# Patient Record
Sex: Female | Born: 1940 | Race: White | Hispanic: No | Smoking: Never smoker
Health system: Southern US, Community
[De-identification: ages and names within clinical notes are randomized; demographics above are authoritative.]

## PROBLEM LIST (undated history)

## (undated) DIAGNOSIS — Z8489 Family history of other specified conditions: Secondary | ICD-10-CM

## (undated) DIAGNOSIS — M1990 Unspecified osteoarthritis, unspecified site: Secondary | ICD-10-CM

## (undated) DIAGNOSIS — Z8701 Personal history of pneumonia (recurrent): Secondary | ICD-10-CM

## (undated) DIAGNOSIS — M1711 Unilateral primary osteoarthritis, right knee: Secondary | ICD-10-CM

## (undated) DIAGNOSIS — I10 Essential (primary) hypertension: Secondary | ICD-10-CM

## (undated) DIAGNOSIS — K219 Gastro-esophageal reflux disease without esophagitis: Secondary | ICD-10-CM

## (undated) DIAGNOSIS — I6529 Occlusion and stenosis of unspecified carotid artery: Secondary | ICD-10-CM

## (undated) DIAGNOSIS — I480 Paroxysmal atrial fibrillation: Secondary | ICD-10-CM

## (undated) DIAGNOSIS — J3089 Other allergic rhinitis: Secondary | ICD-10-CM

## (undated) DIAGNOSIS — J189 Pneumonia, unspecified organism: Secondary | ICD-10-CM

## (undated) DIAGNOSIS — J302 Other seasonal allergic rhinitis: Secondary | ICD-10-CM

## (undated) DIAGNOSIS — D649 Anemia, unspecified: Secondary | ICD-10-CM

## (undated) DIAGNOSIS — J45909 Unspecified asthma, uncomplicated: Secondary | ICD-10-CM

## (undated) DIAGNOSIS — R011 Cardiac murmur, unspecified: Secondary | ICD-10-CM

## (undated) HISTORY — DX: Unspecified osteoarthritis, unspecified site: M19.90

## (undated) HISTORY — DX: Essential (primary) hypertension: I10

## (undated) HISTORY — PX: TONSILLECTOMY: SUR1361

## (undated) HISTORY — DX: Other seasonal allergic rhinitis: J30.2

## (undated) HISTORY — PX: COLONOSCOPY: SHX174

## (undated) HISTORY — DX: Paroxysmal atrial fibrillation: I48.0

## (undated) HISTORY — PX: JOINT REPLACEMENT: SHX530

## (undated) HISTORY — PX: CATARACT EXTRACTION W/ INTRAOCULAR LENS  IMPLANT, BILATERAL: SHX1307

## (undated) HISTORY — PX: FRACTURE SURGERY: SHX138

## (undated) HISTORY — DX: Occlusion and stenosis of unspecified carotid artery: I65.29

## (undated) HISTORY — DX: Personal history of pneumonia (recurrent): Z87.01

## (undated) HISTORY — DX: Cardiac murmur, unspecified: R01.1

## (undated) HISTORY — PX: REPLACEMENT TOTAL KNEE: SUR1224

---

## 1983-02-15 HISTORY — PX: DILATION AND CURETTAGE OF UTERUS: SHX78

## 1983-02-15 HISTORY — PX: ABDOMINAL HYSTERECTOMY: SHX81

## 2000-05-23 ENCOUNTER — Ambulatory Visit (HOSPITAL_COMMUNITY): Admission: RE | Admit: 2000-05-23 | Discharge: 2000-05-23 | Payer: Self-pay | Admitting: Ophthalmology

## 2000-12-06 ENCOUNTER — Encounter: Payer: Self-pay | Admitting: Internal Medicine

## 2000-12-06 ENCOUNTER — Ambulatory Visit (HOSPITAL_COMMUNITY): Admission: RE | Admit: 2000-12-06 | Discharge: 2000-12-06 | Payer: Self-pay | Admitting: Internal Medicine

## 2001-10-09 ENCOUNTER — Ambulatory Visit (HOSPITAL_COMMUNITY): Admission: RE | Admit: 2001-10-09 | Discharge: 2001-10-09 | Payer: Self-pay | Admitting: Ophthalmology

## 2002-05-14 ENCOUNTER — Ambulatory Visit (HOSPITAL_COMMUNITY): Admission: RE | Admit: 2002-05-14 | Discharge: 2002-05-14 | Payer: Self-pay | Admitting: Internal Medicine

## 2002-05-14 ENCOUNTER — Encounter: Payer: Self-pay | Admitting: Internal Medicine

## 2002-12-23 ENCOUNTER — Other Ambulatory Visit: Admission: RE | Admit: 2002-12-23 | Discharge: 2002-12-23 | Payer: Self-pay | Admitting: Dermatology

## 2003-07-07 ENCOUNTER — Ambulatory Visit (HOSPITAL_COMMUNITY): Admission: RE | Admit: 2003-07-07 | Discharge: 2003-07-07 | Payer: Self-pay | Admitting: Internal Medicine

## 2004-08-02 ENCOUNTER — Ambulatory Visit (HOSPITAL_COMMUNITY): Admission: RE | Admit: 2004-08-02 | Discharge: 2004-08-02 | Payer: Self-pay | Admitting: Family Medicine

## 2004-12-27 ENCOUNTER — Ambulatory Visit: Payer: Self-pay | Admitting: Internal Medicine

## 2004-12-27 ENCOUNTER — Ambulatory Visit (HOSPITAL_COMMUNITY): Admission: RE | Admit: 2004-12-27 | Discharge: 2004-12-27 | Payer: Self-pay | Admitting: Internal Medicine

## 2005-04-26 ENCOUNTER — Ambulatory Visit (HOSPITAL_COMMUNITY): Admission: RE | Admit: 2005-04-26 | Discharge: 2005-04-26 | Payer: Self-pay | Admitting: Family Medicine

## 2005-08-04 ENCOUNTER — Ambulatory Visit (HOSPITAL_COMMUNITY): Admission: RE | Admit: 2005-08-04 | Discharge: 2005-08-04 | Payer: Self-pay | Admitting: Family Medicine

## 2005-08-09 ENCOUNTER — Ambulatory Visit (HOSPITAL_COMMUNITY): Admission: RE | Admit: 2005-08-09 | Discharge: 2005-08-09 | Payer: Self-pay | Admitting: Ophthalmology

## 2005-11-17 ENCOUNTER — Ambulatory Visit (HOSPITAL_COMMUNITY): Admission: RE | Admit: 2005-11-17 | Discharge: 2005-11-17 | Payer: Self-pay | Admitting: Urology

## 2006-08-08 ENCOUNTER — Ambulatory Visit (HOSPITAL_COMMUNITY): Admission: RE | Admit: 2006-08-08 | Discharge: 2006-08-08 | Payer: Self-pay | Admitting: Family Medicine

## 2006-08-10 ENCOUNTER — Ambulatory Visit (HOSPITAL_COMMUNITY): Admission: RE | Admit: 2006-08-10 | Discharge: 2006-08-10 | Payer: Self-pay | Admitting: Family Medicine

## 2006-11-01 ENCOUNTER — Ambulatory Visit (HOSPITAL_COMMUNITY): Admission: RE | Admit: 2006-11-01 | Discharge: 2006-11-01 | Payer: Self-pay | Admitting: Family Medicine

## 2007-08-14 ENCOUNTER — Ambulatory Visit (HOSPITAL_COMMUNITY): Admission: RE | Admit: 2007-08-14 | Discharge: 2007-08-14 | Payer: Self-pay | Admitting: Family Medicine

## 2007-10-04 ENCOUNTER — Ambulatory Visit (HOSPITAL_COMMUNITY): Admission: RE | Admit: 2007-10-04 | Discharge: 2007-10-04 | Payer: Self-pay | Admitting: Family Medicine

## 2008-09-05 ENCOUNTER — Ambulatory Visit (HOSPITAL_COMMUNITY): Admission: RE | Admit: 2008-09-05 | Discharge: 2008-09-05 | Payer: Self-pay | Admitting: Family Medicine

## 2009-02-18 ENCOUNTER — Emergency Department (HOSPITAL_COMMUNITY): Admission: EM | Admit: 2009-02-18 | Discharge: 2009-02-18 | Payer: Self-pay | Admitting: Emergency Medicine

## 2009-10-09 ENCOUNTER — Ambulatory Visit (HOSPITAL_COMMUNITY): Admission: RE | Admit: 2009-10-09 | Discharge: 2009-10-09 | Payer: Self-pay | Admitting: Family Medicine

## 2010-03-08 ENCOUNTER — Encounter: Payer: Self-pay | Admitting: Family Medicine

## 2010-03-16 ENCOUNTER — Ambulatory Visit (HOSPITAL_COMMUNITY)
Admission: RE | Admit: 2010-03-16 | Discharge: 2010-03-16 | Payer: Self-pay | Source: Home / Self Care | Attending: Family Medicine | Admitting: Family Medicine

## 2010-03-16 LAB — CREATININE, SERUM
Creatinine, Ser: 0.94 mg/dL (ref 0.4–1.2)
GFR calc Af Amer: >60 mL/min (ref >60)
GFR calc non Af Amer: 59 mL/min — ABNORMAL LOW (ref >60)

## 2010-05-02 LAB — CBC
HCT: 33.3 % — ABNORMAL LOW (ref 36.0–46.0)
Hemoglobin: 11.4 g/dL — ABNORMAL LOW (ref 12.0–15.0)
MCHC: 34.3 g/dL (ref 30.0–36.0)
MCV: 82.4 fL (ref 78.0–100.0)
Platelets: 230 10*3/uL (ref 150–400)
RBC: 4.04 MIL/uL (ref 3.87–5.11)
RDW: 13.6 % (ref 11.5–15.5)
WBC: 5.6 K/uL (ref 4.0–10.5)

## 2010-05-02 LAB — BASIC METABOLIC PANEL WITH GFR
CO2: 27 meq/L (ref 19–32)
Chloride: 100 meq/L (ref 96–112)
Creatinine, Ser: 0.95 mg/dL (ref 0.4–1.2)
GFR calc Af Amer: >60 mL/min (ref >60)
Potassium: 3.9 meq/L (ref 3.5–5.1)

## 2010-05-02 LAB — DIFFERENTIAL
Basophils Absolute: 0 K/uL (ref 0.0–0.1)
Basophils Relative: 1 % (ref 0–1)
Eosinophils Absolute: 0.2 10*3/uL (ref 0.0–0.7)
Eosinophils Relative: 4 % (ref 0–5)
Lymphocytes Relative: 29 % (ref 12–46)
Lymphs Abs: 1.6 10*3/uL (ref 0.7–4.0)
Monocytes Absolute: 0.4 K/uL (ref 0.1–1.0)
Monocytes Relative: 7 % (ref 3–12)
Neutro Abs: 3.4 K/uL (ref 1.7–7.7)
Neutrophils Relative %: 60 % (ref 43–77)

## 2010-05-02 LAB — POCT CARDIAC MARKERS
CKMB, poc: 1.9 ng/mL (ref 1.0–8.0)
Myoglobin, poc: 192 ng/mL (ref 12–200)
Troponin i, poc: <0.05 ng/mL (ref 0.00–0.09)

## 2010-05-02 LAB — BASIC METABOLIC PANEL
BUN: 17 mg/dL (ref 6–23)
Calcium: 9.7 mg/dL (ref 8.4–10.5)
GFR calc non Af Amer: 58 mL/min — ABNORMAL LOW (ref >60)
Glucose, Bld: 119 mg/dL — ABNORMAL HIGH (ref 70–99)
Sodium: 135 mEq/L (ref 135–145)

## 2010-07-02 NOTE — Op Note (Signed)
Bailey Wilson, Bailey Wilson                ACCOUNT NO.:  [id number]   MEDICAL RECORD NO.:  [id number]          PATIENT TYPE:  AMB   LOCATION:  DAY                           FACILITY:  [facility]   PHYSICIAN:  Lionel December, M.D.    DATE OF BIRTH:  [birth year]   DATE OF PROCEDURE:  12/27/2004  DATE OF DISCHARGE:                                 OPERATIVE REPORT   PROCEDURE:  Colonoscopy.   INDICATIONS:  Bailey Wilson is a 70 year old Caucasian female who is here for  screening colonoscopy. Procedure risks were reviewed with the patient, and  informed consent was obtained.   MEDICATIONS:  Medicines used for conscious sedation, Demerol 50 mg IV,  Versed 5 mg IV.   FINDINGS:  Procedure performed in endoscopy suite. The patient's vital signs  and O2 saturations were monitored during the procedure and remained stable.  The patient was placed in left lateral position, and rectal examination  performed. No abnormality noted on external or digital exam. Olympus  videoscope was placed in rectum and advanced under vision into sigmoid colon  and beyond. Preparation was satisfactory. Scope was passed into cecum which  was identified by appendiceal orifice and ileocecal valve. Picture was taken  for the record. As the scope was withdrawn, colonic mucosa was carefully  examined and was normal throughout. Rectal mucosa was normal. Scope was  retroflexed to examine anorectal junction, and small hemorrhoids were noted  below the dentate line. Endoscope was straightened and withdrawn. The  patient tolerated the procedure well.   FINAL DIAGNOSIS:  Small external hemorrhoids. Otherwise normal colonoscopy.   RECOMMENDATIONS:  1.  She will resume her usual medications and diet.  2.  Yearly Hemoccults.  3.  She may consider next screening exam in 10 years from now.      Lionel December, M.D.  Electronically Signed     NR/MEDQ  D:  12/27/2004  T:  12/27/2004  Job:  [id number]   cc:   Patrica Duel, M.D.  Fax: [phone]

## 2010-09-24 ENCOUNTER — Other Ambulatory Visit (HOSPITAL_COMMUNITY): Payer: Self-pay | Admitting: Family Medicine

## 2010-09-24 DIAGNOSIS — Z139 Encounter for screening, unspecified: Secondary | ICD-10-CM

## 2010-10-12 ENCOUNTER — Ambulatory Visit (HOSPITAL_COMMUNITY)
Admission: RE | Admit: 2010-10-12 | Discharge: 2010-10-12 | Disposition: A | Discharge status: Home / Self Care | Payer: Medicare FFS | Source: Ambulatory Visit | Attending: Family Medicine | Admitting: Family Medicine

## 2010-10-12 DIAGNOSIS — Z139 Encounter for screening, unspecified: Secondary | ICD-10-CM

## 2010-10-12 DIAGNOSIS — Z1231 Encounter for screening mammogram for malignant neoplasm of breast: Secondary | ICD-10-CM | POA: Insufficient documentation

## 2011-01-04 ENCOUNTER — Ambulatory Visit (HOSPITAL_COMMUNITY)
Admission: RE | Admit: 2011-01-04 | Discharge: 2011-01-04 | Disposition: A | Discharge status: Home / Self Care | Payer: Medicare FFS | Source: Ambulatory Visit | Attending: Pulmonary Disease | Admitting: Pulmonary Disease

## 2011-01-04 DIAGNOSIS — R0989 Other specified symptoms and signs involving the circulatory and respiratory systems: Secondary | ICD-10-CM | POA: Insufficient documentation

## 2011-01-04 DIAGNOSIS — R0609 Other forms of dyspnea: Secondary | ICD-10-CM | POA: Insufficient documentation

## 2011-01-04 LAB — BLOOD GAS, ARTERIAL
Bicarbonate: 25.7 mEq/L — ABNORMAL HIGH (ref 20.0–24.0)
O2 Saturation: 96.8 %
pCO2 arterial: 38.8 mmHg (ref 35.0–45.0)
pH, Arterial: 7.436 — ABNORMAL HIGH (ref 7.350–7.400)
pO2, Arterial: 80.9 mmHg (ref 80.0–100.0)

## 2011-01-05 NOTE — Procedures (Signed)
Bailey Wilson, Bailey Wilson                ACCOUNT NO.:  [id number]  MEDICAL RECORD NO.:  [id number]  LOCATION:  RESP                          FACILITY:  [facility]  PHYSICIAN:  Idalia Allbritton L. Juanetta Gosling, M.D.DATE OF BIRTH:  [birth year]  DATE OF PROCEDURE: DATE OF DISCHARGE:  01/04/2011                           PULMONARY FUNCTION TEST   REASON FOR PULMONARY FUNCTION TESTING:  Asthma.  1. Spirometry shows a mild ventilatory defect but no definite airflow     obstruction. 2. Lung volumes are normal. 3. DLCO is mildly reduced. 4. Arterial blood gas is normal. 5. There is no definite demonstration of asthma on this pulmonary     function test.     Laressa Bolinger L. Juanetta Gosling, M.D.     ELH/MEDQ  D:  01/05/2011  T:  01/05/2011  Job:  [id number]

## 2011-01-11 ENCOUNTER — Encounter (HOSPITAL_COMMUNITY): Payer: Medicare FFS

## 2011-03-21 ENCOUNTER — Encounter (HOSPITAL_COMMUNITY): Payer: Self-pay | Admitting: Pharmacy Technician

## 2011-03-29 ENCOUNTER — Encounter (HOSPITAL_COMMUNITY)
Admission: RE | Disposition: A | Discharge status: Home / Self Care | Payer: Self-pay | Source: Ambulatory Visit | Attending: Ophthalmology

## 2011-03-29 ENCOUNTER — Ambulatory Visit (HOSPITAL_COMMUNITY)
Admission: RE | Admit: 2011-03-29 | Discharge: 2011-03-29 | Disposition: A | Discharge status: Home / Self Care | Payer: Medicare PPO | Source: Ambulatory Visit | Attending: Ophthalmology | Admitting: Ophthalmology

## 2011-03-29 DIAGNOSIS — H26499 Other secondary cataract, unspecified eye: Secondary | ICD-10-CM | POA: Insufficient documentation

## 2011-03-29 SURGERY — TREATMENT, USING YAG LASER
Anesthesia: LOCAL | Laterality: Right

## 2011-03-29 MED ORDER — CYCLOPENTOLATE-PHENYLEPHRINE 0.2-1 % OP SOLN
OPHTHALMIC | Status: AC
  Administered 2011-03-29: 1 [drp] via OPHTHALMIC
  Filled 2011-03-29: qty 2

## 2011-03-29 MED ORDER — TETRACAINE HCL 0.5 % OP SOLN: 1.0000 [drp] | Freq: Once | OPHTHALMIC | Status: DC

## 2011-03-29 MED ORDER — TROPICAMIDE 1 % OP SOLN: 1.0000 [drp] | OPHTHALMIC | Status: DC | PRN

## 2011-03-29 MED ORDER — CYCLOPENTOLATE-PHENYLEPHRINE 0.2-1 % OP SOLN
1.0000 [drp] | OPHTHALMIC | Status: AC
  Administered 2011-03-29 (×3): 1 [drp] via OPHTHALMIC

## 2011-03-29 MED ORDER — APRACLONIDINE HCL 1 % OP SOLN: 1.0000 [drp] | OPHTHALMIC | Status: DC | PRN

## 2011-03-29 MED ORDER — APRACLONIDINE HCL 1 % OP SOLN: 1.0000 [drp] | Freq: Once | OPHTHALMIC | Status: DC

## 2011-03-29 NOTE — Discharge Instructions (Signed)
OLIE SCAFFIDI  03/29/2011     Instructions    Activity: No Restrictions.   Diet: Resume Diet you were on at home.   Pain Medication: Tylenol if Needed.   CONTACT YOUR DOCTOR IF YOU HAVE PAIN, REDNESS IN YOUR EYE, OR DECREASED VISION.   Follow-up:today between 1 and 2 pm with Loraine Leriche T. [name], MD.   Dr. Lahoma Crocker: [phone]  Dr. Lita Mains: [phone]  Dr. Alto Denver: [phone]   If you find that you cannot contact your physician, but feel that your signs and   Symptoms warrant a physician's attention, call the Emergency Room at   [phone] [phone].   Othern/a.

## 2011-03-29 NOTE — H&P (Signed)
The patient was re examined and there is no change in the patients condition since the original H and P. 

## 2011-03-29 NOTE — Brief Op Note (Signed)
Bailey Wilson 03/29/2011  Atiya Yera T. [name], MD  Yag Laser Self Test Completedyes. Procedure: Posterior Capsulotomy, right eye.  Eye Protection Worn by Staff yes. Laser In Use Sign on Door yes.  Laser: Nd:YAG Spot Size: Fixed Burst Mode: III Power Setting: 3.9 mJ/burst  Number of shots:12 Total energy delivered: 45.5 mJ  Patency of the peripheral iridotomy was confirmed visually.  The patient tolerated the procedure without difficulty. No complications were encountered.    The patient was discharged home with the instructions to continue all her current glaucoma medications, if any.   Patient instructed to go to office at 0100 for intraocular pressure check.  Patient verbalizes understanding of discharge instructions yes.

## 2011-03-29 NOTE — Patient Instructions (Signed)
Bailey Wilson  03/29/2011     Instructions    Activity: No Restrictions.   Diet: Resume Diet you were on at home.   Pain Medication: Tylenol if Needed.   CONTACT YOUR DOCTOR IF YOU HAVE PAIN, REDNESS IN YOUR EYE, OR DECREASED VISION.   Follow-up:today with Loraine Leriche T. [name], MD.   Dr. Lahoma Crocker: [phone]  Dr. Lita Mains: [phone]  Dr. Alto Denver: [phone]   If you find that you cannot contact your physician, but feel that your signs and   Symptoms warrant a physician's attention, call the Emergency Room at   [phone].   Othern/a.

## 2011-09-21 ENCOUNTER — Other Ambulatory Visit (HOSPITAL_COMMUNITY): Payer: Self-pay | Admitting: Physician Assistant

## 2011-09-21 ENCOUNTER — Ambulatory Visit (HOSPITAL_COMMUNITY)
Admission: RE | Admit: 2011-09-21 | Discharge: 2011-09-21 | Disposition: A | Discharge status: Home / Self Care | Payer: Medicare PPO | Source: Ambulatory Visit | Attending: Physician Assistant | Admitting: Physician Assistant

## 2011-09-21 DIAGNOSIS — R079 Chest pain, unspecified: Secondary | ICD-10-CM | POA: Insufficient documentation

## 2011-09-23 ENCOUNTER — Other Ambulatory Visit (HOSPITAL_COMMUNITY): Payer: Self-pay | Admitting: Family Medicine

## 2011-09-23 DIAGNOSIS — IMO0001 Reserved for inherently not codable concepts without codable children: Secondary | ICD-10-CM

## 2011-10-14 ENCOUNTER — Ambulatory Visit (HOSPITAL_COMMUNITY)
Admission: RE | Admit: 2011-10-14 | Discharge: 2011-10-14 | Disposition: A | Discharge status: Home / Self Care | Payer: Medicare PPO | Source: Ambulatory Visit | Attending: Family Medicine | Admitting: Family Medicine

## 2011-10-14 DIAGNOSIS — IMO0001 Reserved for inherently not codable concepts without codable children: Secondary | ICD-10-CM

## 2011-10-14 DIAGNOSIS — Z1231 Encounter for screening mammogram for malignant neoplasm of breast: Secondary | ICD-10-CM | POA: Insufficient documentation

## 2011-10-24 ENCOUNTER — Other Ambulatory Visit: Payer: Self-pay | Admitting: Family Medicine

## 2011-10-24 DIAGNOSIS — R928 Other abnormal and inconclusive findings on diagnostic imaging of breast: Secondary | ICD-10-CM

## 2011-11-02 ENCOUNTER — Other Ambulatory Visit: Payer: Self-pay | Admitting: Family Medicine

## 2011-11-02 ENCOUNTER — Ambulatory Visit (HOSPITAL_COMMUNITY)
Admission: RE | Admit: 2011-11-02 | Discharge: 2011-11-02 | Disposition: A | Discharge status: Home / Self Care | Payer: Medicare PPO | Source: Ambulatory Visit | Attending: Family Medicine | Admitting: Family Medicine

## 2011-11-02 DIAGNOSIS — R928 Other abnormal and inconclusive findings on diagnostic imaging of breast: Secondary | ICD-10-CM

## 2012-05-24 ENCOUNTER — Other Ambulatory Visit (HOSPITAL_COMMUNITY): Payer: Self-pay | Admitting: Internal Medicine

## 2012-05-24 DIAGNOSIS — Z0000 Encounter for general adult medical examination without abnormal findings: Secondary | ICD-10-CM

## 2012-05-28 ENCOUNTER — Ambulatory Visit (HOSPITAL_COMMUNITY)
Admission: RE | Admit: 2012-05-28 | Discharge: 2012-05-28 | Disposition: A | Discharge status: Home / Self Care | Payer: Medicare PPO | Source: Ambulatory Visit | Attending: Internal Medicine | Admitting: Internal Medicine

## 2012-05-28 DIAGNOSIS — M899 Disorder of bone, unspecified: Secondary | ICD-10-CM | POA: Insufficient documentation

## 2012-05-28 DIAGNOSIS — Z0000 Encounter for general adult medical examination without abnormal findings: Secondary | ICD-10-CM

## 2012-06-26 ENCOUNTER — Emergency Department (HOSPITAL_COMMUNITY): Payer: Worker's Compensation

## 2012-06-26 ENCOUNTER — Encounter (HOSPITAL_COMMUNITY): Payer: Self-pay | Admitting: Emergency Medicine

## 2012-06-26 ENCOUNTER — Emergency Department (HOSPITAL_COMMUNITY)
Admission: EM | Admit: 2012-06-26 | Discharge: 2012-06-26 | Disposition: A | Discharge status: Home / Self Care | Payer: Worker's Compensation | Attending: Emergency Medicine | Admitting: Emergency Medicine

## 2012-06-26 DIAGNOSIS — Y92410 Unspecified street and highway as the place of occurrence of the external cause: Secondary | ICD-10-CM | POA: Insufficient documentation

## 2012-06-26 DIAGNOSIS — S93501A Unspecified sprain of right great toe, initial encounter: Secondary | ICD-10-CM

## 2012-06-26 DIAGNOSIS — Z79899 Other long term (current) drug therapy: Secondary | ICD-10-CM | POA: Insufficient documentation

## 2012-06-26 DIAGNOSIS — Y9389 Activity, other specified: Secondary | ICD-10-CM | POA: Insufficient documentation

## 2012-06-26 DIAGNOSIS — Z7982 Long term (current) use of aspirin: Secondary | ICD-10-CM | POA: Insufficient documentation

## 2012-06-26 DIAGNOSIS — S93609A Unspecified sprain of unspecified foot, initial encounter: Secondary | ICD-10-CM | POA: Insufficient documentation

## 2012-06-26 DIAGNOSIS — I10 Essential (primary) hypertension: Secondary | ICD-10-CM | POA: Insufficient documentation

## 2012-06-26 DIAGNOSIS — Z880 Allergy status to penicillin: Secondary | ICD-10-CM | POA: Insufficient documentation

## 2012-06-26 HISTORY — DX: Essential (primary) hypertension: I10

## 2012-06-26 MED ORDER — METHOCARBAMOL 500 MG PO TABS: 500.0000 mg | ORAL_TABLET | Freq: Three times a day (TID) | ORAL | Status: DC | PRN
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
## 2012-10-10 ENCOUNTER — Other Ambulatory Visit (HOSPITAL_COMMUNITY): Payer: Self-pay | Admitting: Family Medicine

## 2012-10-10 DIAGNOSIS — Z139 Encounter for screening, unspecified: Secondary | ICD-10-CM

## 2012-10-22 ENCOUNTER — Ambulatory Visit (HOSPITAL_COMMUNITY)
Admission: RE | Admit: 2012-10-22 | Discharge: 2012-10-22 | Disposition: A | Discharge status: Home / Self Care | Payer: Medicare PPO | Source: Ambulatory Visit | Attending: Family Medicine | Admitting: Family Medicine

## 2012-10-22 DIAGNOSIS — Z1231 Encounter for screening mammogram for malignant neoplasm of breast: Secondary | ICD-10-CM | POA: Insufficient documentation

## 2012-10-22 DIAGNOSIS — Z139 Encounter for screening, unspecified: Secondary | ICD-10-CM

## 2013-09-18 ENCOUNTER — Other Ambulatory Visit (HOSPITAL_COMMUNITY): Payer: Self-pay | Admitting: Family Medicine

## 2013-09-18 DIAGNOSIS — Z1231 Encounter for screening mammogram for malignant neoplasm of breast: Secondary | ICD-10-CM

## 2013-10-23 ENCOUNTER — Ambulatory Visit (HOSPITAL_COMMUNITY)
Admission: RE | Admit: 2013-10-23 | Discharge: 2013-10-23 | Disposition: A | Discharge status: Home / Self Care | Payer: Medicare HMO | Source: Ambulatory Visit | Attending: Family Medicine | Admitting: Family Medicine

## 2013-10-23 DIAGNOSIS — Z1231 Encounter for screening mammogram for malignant neoplasm of breast: Secondary | ICD-10-CM | POA: Diagnosis not present

## 2014-02-14 HISTORY — PX: NASAL FRACTURE SURGERY: SHX718

## 2014-02-18 DIAGNOSIS — L259 Unspecified contact dermatitis, unspecified cause: Secondary | ICD-10-CM | POA: Diagnosis not present

## 2014-02-18 DIAGNOSIS — L570 Actinic keratosis: Secondary | ICD-10-CM | POA: Diagnosis not present

## 2014-02-18 DIAGNOSIS — X32XXXD Exposure to sunlight, subsequent encounter: Secondary | ICD-10-CM | POA: Diagnosis not present

## 2014-03-27 DIAGNOSIS — M810 Age-related osteoporosis without current pathological fracture: Secondary | ICD-10-CM | POA: Diagnosis not present

## 2014-03-27 DIAGNOSIS — Z0000 Encounter for general adult medical examination without abnormal findings: Secondary | ICD-10-CM | POA: Diagnosis not present

## 2014-03-27 DIAGNOSIS — Z6827 Body mass index (BMI) 27.0-27.9, adult: Secondary | ICD-10-CM | POA: Diagnosis not present

## 2014-05-13 DIAGNOSIS — Z961 Presence of intraocular lens: Secondary | ICD-10-CM | POA: Diagnosis not present

## 2014-05-13 DIAGNOSIS — H3531 Nonexudative age-related macular degeneration: Secondary | ICD-10-CM | POA: Diagnosis not present

## 2014-06-13 DIAGNOSIS — E663 Overweight: Secondary | ICD-10-CM | POA: Diagnosis not present

## 2014-06-13 DIAGNOSIS — M1991 Primary osteoarthritis, unspecified site: Secondary | ICD-10-CM | POA: Diagnosis not present

## 2014-06-13 DIAGNOSIS — Z6827 Body mass index (BMI) 27.0-27.9, adult: Secondary | ICD-10-CM | POA: Diagnosis not present

## 2014-06-13 DIAGNOSIS — J45909 Unspecified asthma, uncomplicated: Secondary | ICD-10-CM | POA: Diagnosis not present

## 2014-06-13 DIAGNOSIS — M7989 Other specified soft tissue disorders: Secondary | ICD-10-CM | POA: Diagnosis not present

## 2014-06-13 DIAGNOSIS — M7121 Synovial cyst of popliteal space [Baker], right knee: Secondary | ICD-10-CM | POA: Diagnosis not present

## 2014-06-24 DIAGNOSIS — M170 Bilateral primary osteoarthritis of knee: Secondary | ICD-10-CM | POA: Diagnosis not present

## 2014-06-24 DIAGNOSIS — M545 Low back pain: Secondary | ICD-10-CM | POA: Diagnosis not present

## 2014-08-05 DIAGNOSIS — M170 Bilateral primary osteoarthritis of knee: Secondary | ICD-10-CM | POA: Diagnosis not present

## 2014-09-23 DIAGNOSIS — M170 Bilateral primary osteoarthritis of knee: Secondary | ICD-10-CM | POA: Diagnosis not present

## 2014-09-25 ENCOUNTER — Other Ambulatory Visit (HOSPITAL_COMMUNITY): Payer: Self-pay | Admitting: Family Medicine

## 2014-09-25 DIAGNOSIS — Z1231 Encounter for screening mammogram for malignant neoplasm of breast: Secondary | ICD-10-CM

## 2014-10-21 DIAGNOSIS — L570 Actinic keratosis: Secondary | ICD-10-CM | POA: Diagnosis not present

## 2014-10-21 DIAGNOSIS — X32XXXD Exposure to sunlight, subsequent encounter: Secondary | ICD-10-CM | POA: Diagnosis not present

## 2014-10-21 DIAGNOSIS — D225 Melanocytic nevi of trunk: Secondary | ICD-10-CM | POA: Diagnosis not present

## 2014-10-31 ENCOUNTER — Ambulatory Visit (HOSPITAL_COMMUNITY)
Admission: RE | Admit: 2014-10-31 | Discharge: 2014-10-31 | Disposition: A | Discharge status: Home / Self Care | Payer: Medicare Other | Source: Ambulatory Visit | Attending: Family Medicine | Admitting: Family Medicine

## 2014-10-31 DIAGNOSIS — Z1231 Encounter for screening mammogram for malignant neoplasm of breast: Secondary | ICD-10-CM | POA: Diagnosis not present

## 2014-12-18 ENCOUNTER — Encounter (INDEPENDENT_AMBULATORY_CARE_PROVIDER_SITE_OTHER): Payer: Self-pay | Admitting: *Deleted

## 2015-01-01 DIAGNOSIS — Z6828 Body mass index (BMI) 28.0-28.9, adult: Secondary | ICD-10-CM | POA: Diagnosis not present

## 2015-01-01 DIAGNOSIS — J301 Allergic rhinitis due to pollen: Secondary | ICD-10-CM | POA: Diagnosis not present

## 2015-01-01 DIAGNOSIS — Z1389 Encounter for screening for other disorder: Secondary | ICD-10-CM | POA: Diagnosis not present

## 2015-01-01 DIAGNOSIS — I10 Essential (primary) hypertension: Secondary | ICD-10-CM | POA: Diagnosis not present

## 2015-01-01 DIAGNOSIS — M1991 Primary osteoarthritis, unspecified site: Secondary | ICD-10-CM | POA: Diagnosis not present

## 2015-01-06 DIAGNOSIS — M170 Bilateral primary osteoarthritis of knee: Secondary | ICD-10-CM | POA: Diagnosis not present

## 2015-01-26 ENCOUNTER — Emergency Department (HOSPITAL_COMMUNITY)
Admission: EM | Admit: 2015-01-26 | Discharge: 2015-01-26 | Disposition: A | Discharge status: Home / Self Care | Payer: Medicare Other | Attending: Emergency Medicine | Admitting: Emergency Medicine

## 2015-01-26 ENCOUNTER — Emergency Department (HOSPITAL_COMMUNITY): Payer: Medicare Other

## 2015-01-26 ENCOUNTER — Encounter (HOSPITAL_COMMUNITY): Payer: Self-pay | Admitting: Emergency Medicine

## 2015-01-26 DIAGNOSIS — W010XXA Fall on same level from slipping, tripping and stumbling without subsequent striking against object, initial encounter: Secondary | ICD-10-CM | POA: Diagnosis not present

## 2015-01-26 DIAGNOSIS — Z880 Allergy status to penicillin: Secondary | ICD-10-CM | POA: Diagnosis not present

## 2015-01-26 DIAGNOSIS — Y9289 Other specified places as the place of occurrence of the external cause: Secondary | ICD-10-CM | POA: Insufficient documentation

## 2015-01-26 DIAGNOSIS — S0990XA Unspecified injury of head, initial encounter: Secondary | ICD-10-CM | POA: Insufficient documentation

## 2015-01-26 DIAGNOSIS — Z79899 Other long term (current) drug therapy: Secondary | ICD-10-CM | POA: Insufficient documentation

## 2015-01-26 DIAGNOSIS — S199XXA Unspecified injury of neck, initial encounter: Secondary | ICD-10-CM | POA: Diagnosis not present

## 2015-01-26 DIAGNOSIS — S022XXB Fracture of nasal bones, initial encounter for open fracture: Secondary | ICD-10-CM | POA: Diagnosis not present

## 2015-01-26 DIAGNOSIS — W19XXXA Unspecified fall, initial encounter: Secondary | ICD-10-CM

## 2015-01-26 DIAGNOSIS — S01511A Laceration without foreign body of lip, initial encounter: Secondary | ICD-10-CM | POA: Diagnosis not present

## 2015-01-26 DIAGNOSIS — S022XXA Fracture of nasal bones, initial encounter for closed fracture: Secondary | ICD-10-CM | POA: Diagnosis not present

## 2015-01-26 DIAGNOSIS — S0240CA Maxillary fracture, right side, initial encounter for closed fracture: Secondary | ICD-10-CM | POA: Insufficient documentation

## 2015-01-26 DIAGNOSIS — S60511A Abrasion of right hand, initial encounter: Secondary | ICD-10-CM | POA: Insufficient documentation

## 2015-01-26 DIAGNOSIS — S0240DA Maxillary fracture, left side, initial encounter for closed fracture: Secondary | ICD-10-CM | POA: Insufficient documentation

## 2015-01-26 DIAGNOSIS — S60512A Abrasion of left hand, initial encounter: Secondary | ICD-10-CM | POA: Diagnosis not present

## 2015-01-26 DIAGNOSIS — Y998 Other external cause status: Secondary | ICD-10-CM | POA: Insufficient documentation

## 2015-01-26 DIAGNOSIS — S0292XA Unspecified fracture of facial bones, initial encounter for closed fracture: Secondary | ICD-10-CM

## 2015-01-26 DIAGNOSIS — Z7982 Long term (current) use of aspirin: Secondary | ICD-10-CM | POA: Diagnosis not present

## 2015-01-26 DIAGNOSIS — Y9389 Activity, other specified: Secondary | ICD-10-CM | POA: Diagnosis not present

## 2015-01-26 DIAGNOSIS — S0501XA Injury of conjunctiva and corneal abrasion without foreign body, right eye, initial encounter: Secondary | ICD-10-CM | POA: Diagnosis not present

## 2015-01-26 DIAGNOSIS — I10 Essential (primary) hypertension: Secondary | ICD-10-CM | POA: Diagnosis not present

## 2015-01-26 MED ORDER — LIDOCAINE-EPINEPHRINE (PF) 1 %-1:200000 IJ SOLN
10.0000 mL | Freq: Once | INTRAMUSCULAR | Status: AC
  Administered 2015-01-26: 10 mL via INTRADERMAL
  Filled 2015-01-26: qty 10

## 2015-01-26 MED ORDER — BACITRACIN ZINC 500 UNIT/GM EX OINT
TOPICAL_OINTMENT | CUTANEOUS | Status: AC
  Administered 2015-01-26: 20:00:00
  Filled 2015-01-26: qty 0.9

## 2015-01-26 MED ORDER — TRAMADOL HCL 50 MG PO TABS: 50.0000 mg | ORAL_TABLET | Freq: Four times a day (QID) | ORAL | Status: DC | PRN
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
## 2015-01-26 NOTE — Discharge Instructions (Signed)
Facial Laceration A facial laceration is a cut on the face. These injuries can be painful and cause bleeding. Some cuts may need to be closed with stitches (sutures), skin adhesive strips, or wound glue. Cuts usually heal quickly but can leave a scar. It can take 1-2 years for the scar to go away completely. HOME CARE   Only take medicines as told by your doctor.  Follow your doctor's instructions for wound care. For Stitches:  Keep the cut clean and dry.  If you have a bandage (dressing), change it at least once a day. Change the bandage if it gets wet or dirty, or as told by your doctor.  Wash the cut with soap and water 2 times a day. Rinse the cut with water. Pat it dry with a clean towel.  Put a thin layer of medicated cream on the cut as told by your doctor.  You may shower after the first 24 hours. Do not soak the cut in water until the stitches are removed.  Have your stitches removed as told by your doctor.  Do not wear any makeup until a few days after your stitches are removed. For Skin Adhesive Strips:  Keep the cut clean and dry.  Do not get the strips wet. You may take a bath, but be careful to keep the cut dry.  If the cut gets wet, pat it dry with a clean towel.  The strips will fall off on their own. Do not remove the strips that are still stuck to the cut. For Wound Glue:  You may shower or take baths. Do not soak or scrub the cut. Do not swim. Avoid heavy sweating until the glue falls off on its own. After a shower or bath, pat the cut dry with a clean towel.  Do not put medicine or makeup on your cut until the glue falls off.  If you have a bandage, do not put tape over the glue.  Avoid lots of sunlight or tanning lamps until the glue falls off.  The glue will fall off on its own in 5-10 days. Do not pick at the glue. After Healing:  Put sunscreen on the cut for the first year to reduce your scar. GET HELP IF:  You have a fever. GET HELP RIGHT AWAY  IF:   Your cut area gets red, painful, or puffy (swollen).  You see a yellowish-white fluid (pus) coming from the cut.   This information is not intended to replace advice given to you by your health care provider. Make sure you discuss any questions you have with your health care provider.   Suture removal from the lip in the 5-7 days. Follow up with ear nose and throat for the facial fractures and nasal bone fracture plan is on Friday. Call Dr. Redmond Baseman office for the exact time. They are expecting you.   Document Released: 07/20/2007 Document Revised: 02/21/2014 Document Reviewed: 09/13/2012 Elsevier Interactive Patient Education [employer].

## 2015-01-26 NOTE — ED Provider Notes (Signed)
CSN: [id number]     Arrival date & time 01/26/15  1458 History   First MD Initiated Contact with Patient 01/26/15 1519     Chief Complaint  Patient presents with  . Fall     (Consider location/radiation/quality/duration/timing/severity/associated sxs/prior Treatment) The history is provided by the patient.   a 74 year old female tripped over a cord on the driveway and landed on her face. Oh syncope no loss of consciousness. Patient states tetanus is up-to-date. She is not on blood thinners other than just an aspirin a day. Patient without bleeding from her nose and her mouth and face. Denies any other injuries denies any neck pain back pain chest pain abdominal pain or any extremity injury.  Past Medical History  Diagnosis Date  . Hypertension    Past Surgical History  Procedure Laterality Date  . Abdominal hysterectomy    . Eye surgery     History reviewed. No pertinent family history. Social History  Substance Use Topics  . Smoking status: Never Smoker   . Smokeless tobacco: None  . Alcohol Use: No   OB History    No data available     Review of Systems  Constitutional: Negative for fever.  HENT: Negative for congestion and trouble swallowing.   Eyes: Negative for pain and visual disturbance.  Respiratory: Negative for shortness of breath.   Cardiovascular: Negative for chest pain.  Gastrointestinal: Negative for abdominal pain.  Genitourinary: Negative for dysuria.  Musculoskeletal: Negative for back pain and neck pain.  Skin: Positive for wound. Negative for rash.  Neurological: Positive for facial asymmetry and headaches. Negative for syncope, weakness and numbness.  Hematological: Does not bruise/bleed easily.  Psychiatric/Behavioral: Negative for confusion.      Allergies  Aspirin; Penicillins; and Sulfa antibiotics  Home Medications   Prior to Admission medications   Medication Sig Start Date End Date Taking? Authorizing Provider  ARTIFICIAL TEAR OP  Apply 1-2 drops to eye daily as needed. For dry eyes   Yes Historical Provider, MD  aspirin EC 81 MG tablet Take 81 mg by mouth daily.   Yes Historical Provider, MD  Calcium Carbonate-Vitamin D (CALCIUM + D) 600-200 MG-UNIT TABS Take 1 tablet by mouth 2 (two) times daily.   Yes Historical Provider, MD  cetirizine (ZYRTEC) 10 MG tablet Take 10 mg by mouth daily.   Yes Historical Provider, MD  losartan-hydrochlorothiazide (HYZAAR) 100-25 MG per tablet Take 1 tablet by mouth daily.   Yes Historical Provider, MD  Multiple Vitamins-Minerals (ICAPS MV PO) Take 1 capsule by mouth daily.   Yes Historical Provider, MD  nabumetone (RELAFEN) 500 MG tablet Take 500 mg by mouth 2 (two) times daily as needed for mild pain (arthritis).  01/01/15  Yes Historical Provider, MD  ranitidine (ZANTAC) 150 MG tablet Take 150 mg by mouth 2 (two) times daily.   Yes Historical Provider, MD  UNABLE TO FIND Inject into the muscle every 3 (three) months. Med Name: Cortisone injection every 3 months for arthritis   Yes Historical Provider, MD  traMADol (ULTRAM) 50 MG tablet Take 1 tablet (50 mg total) by mouth every 6 (six) hours as needed. 01/26/15   Fredia Sorrow, MD   BP 166/74 mmHg  Pulse 88  Ht 5\' 6"  (1.676 m)  Wt 77.111 kg  BMI 27.45 kg/m2  SpO2 99% Physical Exam  Constitutional: She is oriented to person, place, and time. She appears well-developed and well-nourished. No distress.  HENT:  Deformity to the nose. Abrasions to the nose  upper lip and chin no septal hematoma. Small laceration 3-5 mm on the bridge of the nose. 1 cm laceration to the upper lip on the backside in the front side. Not to the remaining border.  Patient doesn't have any natural teeth.  Swelling below the right eye and some below the left eye with contusion and ecchymosis.  Eyes: Conjunctivae and EOM are normal. Pupils are equal, round, and reactive to light.  Neck: Normal range of motion. Neck supple.  Cardiovascular: Normal rate.   No  murmur heard. Pulmonary/Chest: Effort normal and breath sounds normal. No respiratory distress.  Abdominal: Soft. Bowel sounds are normal. There is no tenderness.  Musculoskeletal: Normal range of motion. She exhibits no edema.  Superficial abrasions to both hands.  Neurological: She is alert and oriented to person, place, and time. No cranial nerve deficit. She exhibits normal muscle tone. Coordination normal.  Skin: Skin is warm.  Nursing note and vitals reviewed.   ED Course  Procedures (including critical care time) Labs Review Labs Reviewed - No data to display  Imaging Review Ct Head Wo Contrast  01/26/2015  CLINICAL DATA:  74 year old female status post fall out of van onto driveway EXAM: CT HEAD WITHOUT CONTRAST CT MAXILLOFACIAL WITHOUT CONTRAST CT CERVICAL SPINE WITHOUT CONTRAST TECHNIQUE: Multidetector CT imaging of the head, cervical spine, and maxillofacial structures were performed using the standard protocol without intravenous contrast. Multiplanar CT image reconstructions of the cervical spine and maxillofacial structures were also generated. COMPARISON:  Prior CT scan of the cervical spine 02/18/2009 FINDINGS: CT HEAD FINDINGS Negative for acute intracranial hemorrhage, acute infarction, mass, mass effect, hydrocephalus or midline shift. Gray-white differentiation is preserved throughout. Mild cerebral cortical volume loss. Patchy periventricular, subcortical and deep white matter hypoattenuation a moderate severity most consistent with chronic microvascular ischemic white matter disease. No focal scalp contusion or calvarial fracture. CT MAXILLOFACIAL FINDINGS The globes and orbits are intact and symmetric bilaterally. Complex multifocal fractures through both nasal bones and frontal processes of the maxilla bilaterally. There is right-to-left deviation of the bony nasal structures. The nasal septum remains intact. On the right, fracture lines extend through the anterior wall of  the maxillary sinus. There is associated hemo sinus on the right. Otherwise, the visualized mastoid air cells and paranasal sinuses are well aerated. Right periorbital preseptal soft tissue contusion. Multifocal contusion and laceration/ abrasion overlying the bridge of the nose and the right nasolabial fold. No additional fractures identified. The patient is edentulous. CT CERVICAL SPINE FINDINGS No acute fracture, malalignment or prevertebral soft tissue swelling. Multilevel cervical spondylosis at C3-C4, C4-C5 and C5-C6. Unremarkable CT appearance of the thyroid gland. No acute soft tissue abnormality. Nodular pleural parenchymal scarring in the lung apices. IMPRESSION: CT HEAD 1. No acute intracranial abnormality. 2. Mild cerebral cortical volume loss. 3. 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 CT FACE 1. Complex bilateral nasal bone fractures with right to left displacement of the bony structures. The nasal septum remains intact. 2. Mildly displaced fractures to the frontal processes of the maxilla bilaterally. 3. Fracture through the anterior wall of the might maxillary antrum with associated maxillary hemo sinus. 4. Right preseptal periorbital soft tissue contusion. CT CSPINE 1. No acute fracture or malalignment. 2. Multilevel cervical spondylosis. 3. Biapical nodular pleural parenchymal scarring. Electronically Signed   By: Jacqulynn Cadet M.D.   On: 01/26/2015 17:22   Ct Cervical Spine Wo Contrast  01/26/2015  CLINICAL DATA:  74 year old female status post fall out of van onto driveway EXAM:  CT HEAD WITHOUT CONTRAST CT MAXILLOFACIAL WITHOUT CONTRAST CT CERVICAL SPINE WITHOUT CONTRAST TECHNIQUE: Multidetector CT imaging of the head, cervical spine, and maxillofacial structures were performed using the standard protocol without intravenous contrast. Multiplanar CT image reconstructions of the cervical spine and maxillofacial structures were also generated. COMPARISON:  Prior CT  scan of the cervical spine 02/18/2009 FINDINGS: CT HEAD FINDINGS Negative for acute intracranial hemorrhage, acute infarction, mass, mass effect, hydrocephalus or midline shift. Gray-white differentiation is preserved throughout. Mild cerebral cortical volume loss. Patchy periventricular, subcortical and deep white matter hypoattenuation a moderate severity most consistent with chronic microvascular ischemic white matter disease. No focal scalp contusion or calvarial fracture. CT MAXILLOFACIAL FINDINGS The globes and orbits are intact and symmetric bilaterally. Complex multifocal fractures through both nasal bones and frontal processes of the maxilla bilaterally. There is right-to-left deviation of the bony nasal structures. The nasal septum remains intact. On the right, fracture lines extend through the anterior wall of the maxillary sinus. There is associated hemo sinus on the right. Otherwise, the visualized mastoid air cells and paranasal sinuses are well aerated. Right periorbital preseptal soft tissue contusion. Multifocal contusion and laceration/ abrasion overlying the bridge of the nose and the right nasolabial fold. No additional fractures identified. The patient is edentulous. CT CERVICAL SPINE FINDINGS No acute fracture, malalignment or prevertebral soft tissue swelling. Multilevel cervical spondylosis at C3-C4, C4-C5 and C5-C6. Unremarkable CT appearance of the thyroid gland. No acute soft tissue abnormality. Nodular pleural parenchymal scarring in the lung apices. IMPRESSION: CT HEAD 1. No acute intracranial abnormality. 2. Mild cerebral cortical volume loss. 3. 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 CT FACE 1. Complex bilateral nasal bone fractures with right to left displacement of the bony structures. The nasal septum remains intact. 2. Mildly displaced fractures to the frontal processes of the maxilla bilaterally. 3. Fracture through the anterior wall of the might maxillary  antrum with associated maxillary hemo sinus. 4. Right preseptal periorbital soft tissue contusion. CT CSPINE 1. No acute fracture or malalignment. 2. Multilevel cervical spondylosis. 3. Biapical nodular pleural parenchymal scarring. Electronically Signed   By: Jacqulynn Cadet M.D.   On: 01/26/2015 17:22   Ct Maxillofacial Wo Cm  01/26/2015  CLINICAL DATA:  74 year old female status post fall out of van onto driveway EXAM: CT HEAD WITHOUT CONTRAST CT MAXILLOFACIAL WITHOUT CONTRAST CT CERVICAL SPINE WITHOUT CONTRAST TECHNIQUE: Multidetector CT imaging of the head, cervical spine, and maxillofacial structures were performed using the standard protocol without intravenous contrast. Multiplanar CT image reconstructions of the cervical spine and maxillofacial structures were also generated. COMPARISON:  Prior CT scan of the cervical spine 02/18/2009 FINDINGS: CT HEAD FINDINGS Negative for acute intracranial hemorrhage, acute infarction, mass, mass effect, hydrocephalus or midline shift. Gray-white differentiation is preserved throughout. Mild cerebral cortical volume loss. Patchy periventricular, subcortical and deep white matter hypoattenuation a moderate severity most consistent with chronic microvascular ischemic white matter disease. No focal scalp contusion or calvarial fracture. CT MAXILLOFACIAL FINDINGS The globes and orbits are intact and symmetric bilaterally. Complex multifocal fractures through both nasal bones and frontal processes of the maxilla bilaterally. There is right-to-left deviation of the bony nasal structures. The nasal septum remains intact. On the right, fracture lines extend through the anterior wall of the maxillary sinus. There is associated hemo sinus on the right. Otherwise, the visualized mastoid air cells and paranasal sinuses are well aerated. Right periorbital preseptal soft tissue contusion. Multifocal contusion and laceration/ abrasion overlying the bridge of the nose and the  right nasolabial fold. No additional fractures identified. The patient is edentulous. CT CERVICAL SPINE FINDINGS No acute fracture, malalignment or prevertebral soft tissue swelling. Multilevel cervical spondylosis at C3-C4, C4-C5 and C5-C6. Unremarkable CT appearance of the thyroid gland. No acute soft tissue abnormality. Nodular pleural parenchymal scarring in the lung apices. IMPRESSION: CT HEAD 1. No acute intracranial abnormality. 2. Mild cerebral cortical volume loss. 3. 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 CT FACE 1. Complex bilateral nasal bone fractures with right to left displacement of the bony structures. The nasal septum remains intact. 2. Mildly displaced fractures to the frontal processes of the maxilla bilaterally. 3. Fracture through the anterior wall of the might maxillary antrum with associated maxillary hemo sinus. 4. Right preseptal periorbital soft tissue contusion. CT CSPINE 1. No acute fracture or malalignment. 2. Multilevel cervical spondylosis. 3. Biapical nodular pleural parenchymal scarring. Electronically Signed   By: Jacqulynn Cadet M.D.   On: 01/26/2015 17:22   I have personally reviewed and evaluated these images and lab results as part of my medical decision-making.   EKG Interpretation None      LACERATION REPAIR Performed by: Fredia Sorrow Authorized by: Fredia Sorrow Consent: Verbal consent obtained. Risks and benefits: risks, benefits and alternatives were discussed Consent given by: patient Patient identity confirmed: provided demographic data Prepped and Draped in normal sterile fashion Wound explored  Laceration Location: Right upper lip  Laceration Length: 1 cm  No Foreign Bodies seen or palpated  Anesthesia: local infiltration  Local anesthetic: lidocaine 1% with epinephrine  Anesthetic total: 1 ml  Irrigation method: syringe Amount of cleaning: standard  Skin closure: Simple interrupted 6-0 Prolene   Number  of sutures: 2   Technique: Simple interrupted   Patient tolerance: Patient tolerated the procedure well with no immediate complications.  Laceration not to the vermilion border." Anteriorly inferiorly but left open on the backside.    MDM   Final diagnoses:  Lip laceration, initial encounter  Nasal fracture, open, initial encounter  Facial fracture due to fall, closed, initial encounter ([facility])   facial fractures nasal fractures discussed with Dr. Redmond Baseman on call for facial trauma at [facility]. He will follow her up in the office on Friday.  Patient with no other significant injuries.  Suture removal in 5-7 days.    Fredia Sorrow, MD 01/26/15 1925

## 2015-01-26 NOTE — ED Notes (Signed)
Ice pack provided to pt

## 2015-01-30 DIAGNOSIS — S022XXG Fracture of nasal bones, subsequent encounter for fracture with delayed healing: Secondary | ICD-10-CM | POA: Diagnosis not present

## 2015-01-30 DIAGNOSIS — S0240CD Maxillary fracture, right side, subsequent encounter for fracture with routine healing: Secondary | ICD-10-CM | POA: Diagnosis not present

## 2015-02-06 DIAGNOSIS — S022XXA Fracture of nasal bones, initial encounter for closed fracture: Secondary | ICD-10-CM | POA: Diagnosis not present

## 2015-02-06 DIAGNOSIS — S022XXD Fracture of nasal bones, subsequent encounter for fracture with routine healing: Secondary | ICD-10-CM | POA: Diagnosis not present

## 2015-02-18 DIAGNOSIS — Z0100 Encounter for examination of eyes and vision without abnormal findings: Secondary | ICD-10-CM | POA: Diagnosis not present

## 2015-02-24 DIAGNOSIS — H52203 Unspecified astigmatism, bilateral: Secondary | ICD-10-CM | POA: Diagnosis not present

## 2015-02-24 DIAGNOSIS — H524 Presbyopia: Secondary | ICD-10-CM | POA: Diagnosis not present

## 2015-03-04 DIAGNOSIS — N342 Other urethritis: Secondary | ICD-10-CM | POA: Diagnosis not present

## 2015-03-04 DIAGNOSIS — Z1389 Encounter for screening for other disorder: Secondary | ICD-10-CM | POA: Diagnosis not present

## 2015-03-04 DIAGNOSIS — Z6828 Body mass index (BMI) 28.0-28.9, adult: Secondary | ICD-10-CM | POA: Diagnosis not present

## 2015-03-04 DIAGNOSIS — Z23 Encounter for immunization: Secondary | ICD-10-CM | POA: Diagnosis not present

## 2015-03-12 DIAGNOSIS — Z1389 Encounter for screening for other disorder: Secondary | ICD-10-CM | POA: Diagnosis not present

## 2015-03-12 DIAGNOSIS — Z6828 Body mass index (BMI) 28.0-28.9, adult: Secondary | ICD-10-CM | POA: Diagnosis not present

## 2015-03-12 DIAGNOSIS — S8001XA Contusion of right knee, initial encounter: Secondary | ICD-10-CM | POA: Diagnosis not present

## 2015-03-12 DIAGNOSIS — I10 Essential (primary) hypertension: Secondary | ICD-10-CM | POA: Diagnosis not present

## 2015-03-12 DIAGNOSIS — E663 Overweight: Secondary | ICD-10-CM | POA: Diagnosis not present

## 2015-03-12 DIAGNOSIS — R05 Cough: Secondary | ICD-10-CM | POA: Diagnosis not present

## 2015-03-18 DIAGNOSIS — Z1211 Encounter for screening for malignant neoplasm of colon: Secondary | ICD-10-CM | POA: Diagnosis not present

## 2015-04-01 DIAGNOSIS — D225 Melanocytic nevi of trunk: Secondary | ICD-10-CM | POA: Diagnosis not present

## 2015-04-01 DIAGNOSIS — X32XXXD Exposure to sunlight, subsequent encounter: Secondary | ICD-10-CM | POA: Diagnosis not present

## 2015-04-01 DIAGNOSIS — L570 Actinic keratosis: Secondary | ICD-10-CM | POA: Diagnosis not present

## 2015-04-07 DIAGNOSIS — Z0000 Encounter for general adult medical examination without abnormal findings: Secondary | ICD-10-CM | POA: Diagnosis not present

## 2015-04-07 DIAGNOSIS — M1711 Unilateral primary osteoarthritis, right knee: Secondary | ICD-10-CM | POA: Diagnosis not present

## 2015-04-07 DIAGNOSIS — Z6827 Body mass index (BMI) 27.0-27.9, adult: Secondary | ICD-10-CM | POA: Diagnosis not present

## 2015-04-07 DIAGNOSIS — M7051 Other bursitis of knee, right knee: Secondary | ICD-10-CM | POA: Diagnosis not present

## 2015-04-07 DIAGNOSIS — Z1389 Encounter for screening for other disorder: Secondary | ICD-10-CM | POA: Diagnosis not present

## 2015-04-07 DIAGNOSIS — E663 Overweight: Secondary | ICD-10-CM | POA: Diagnosis not present

## 2015-04-10 ENCOUNTER — Other Ambulatory Visit (HOSPITAL_COMMUNITY): Payer: Self-pay | Admitting: Physician Assistant

## 2015-04-10 DIAGNOSIS — M810 Age-related osteoporosis without current pathological fracture: Secondary | ICD-10-CM

## 2015-04-16 ENCOUNTER — Other Ambulatory Visit (HOSPITAL_COMMUNITY): Payer: Self-pay

## 2015-05-26 DIAGNOSIS — M47817 Spondylosis without myelopathy or radiculopathy, lumbosacral region: Secondary | ICD-10-CM | POA: Diagnosis not present

## 2015-05-26 DIAGNOSIS — M1611 Unilateral primary osteoarthritis, right hip: Secondary | ICD-10-CM | POA: Diagnosis not present

## 2015-06-23 DIAGNOSIS — M1611 Unilateral primary osteoarthritis, right hip: Secondary | ICD-10-CM | POA: Diagnosis not present

## 2015-06-23 DIAGNOSIS — M47817 Spondylosis without myelopathy or radiculopathy, lumbosacral region: Secondary | ICD-10-CM | POA: Diagnosis not present

## 2015-06-25 ENCOUNTER — Other Ambulatory Visit: Payer: Self-pay | Admitting: Orthopedic Surgery

## 2015-06-25 DIAGNOSIS — M545 Low back pain: Secondary | ICD-10-CM

## 2015-07-04 ENCOUNTER — Ambulatory Visit
Admission: RE | Admit: 2015-07-04 | Discharge: 2015-07-04 | Disposition: A | Discharge status: Home / Self Care | Payer: Medicare HMO | Source: Ambulatory Visit | Attending: Orthopedic Surgery | Admitting: Orthopedic Surgery

## 2015-07-04 DIAGNOSIS — M545 Low back pain: Secondary | ICD-10-CM

## 2015-07-04 DIAGNOSIS — M5126 Other intervertebral disc displacement, lumbar region: Secondary | ICD-10-CM | POA: Diagnosis not present

## 2015-07-09 DIAGNOSIS — M47817 Spondylosis without myelopathy or radiculopathy, lumbosacral region: Secondary | ICD-10-CM | POA: Diagnosis not present

## 2015-07-09 DIAGNOSIS — M25551 Pain in right hip: Secondary | ICD-10-CM | POA: Diagnosis not present

## 2015-07-09 DIAGNOSIS — M1611 Unilateral primary osteoarthritis, right hip: Secondary | ICD-10-CM | POA: Diagnosis not present

## 2015-07-17 DIAGNOSIS — M25551 Pain in right hip: Secondary | ICD-10-CM | POA: Diagnosis not present

## 2015-07-17 DIAGNOSIS — M1611 Unilateral primary osteoarthritis, right hip: Secondary | ICD-10-CM | POA: Diagnosis not present

## 2015-08-25 DIAGNOSIS — M25551 Pain in right hip: Secondary | ICD-10-CM | POA: Diagnosis not present

## 2015-08-25 DIAGNOSIS — M1611 Unilateral primary osteoarthritis, right hip: Secondary | ICD-10-CM | POA: Diagnosis not present

## 2015-08-25 DIAGNOSIS — M47817 Spondylosis without myelopathy or radiculopathy, lumbosacral region: Secondary | ICD-10-CM | POA: Diagnosis not present

## 2015-08-25 DIAGNOSIS — M1711 Unilateral primary osteoarthritis, right knee: Secondary | ICD-10-CM | POA: Diagnosis not present

## 2015-09-11 DIAGNOSIS — M5416 Radiculopathy, lumbar region: Secondary | ICD-10-CM | POA: Diagnosis not present

## 2015-09-11 DIAGNOSIS — M47817 Spondylosis without myelopathy or radiculopathy, lumbosacral region: Secondary | ICD-10-CM | POA: Diagnosis not present

## 2015-09-29 DIAGNOSIS — L570 Actinic keratosis: Secondary | ICD-10-CM | POA: Diagnosis not present

## 2015-09-29 DIAGNOSIS — B355 Tinea imbricata: Secondary | ICD-10-CM | POA: Diagnosis not present

## 2015-09-29 DIAGNOSIS — X32XXXD Exposure to sunlight, subsequent encounter: Secondary | ICD-10-CM | POA: Diagnosis not present

## 2015-10-13 ENCOUNTER — Other Ambulatory Visit (HOSPITAL_COMMUNITY): Payer: Self-pay | Admitting: Family Medicine

## 2015-10-13 DIAGNOSIS — M810 Age-related osteoporosis without current pathological fracture: Secondary | ICD-10-CM

## 2015-10-13 DIAGNOSIS — M1711 Unilateral primary osteoarthritis, right knee: Secondary | ICD-10-CM | POA: Diagnosis not present

## 2015-10-13 DIAGNOSIS — Z1231 Encounter for screening mammogram for malignant neoplasm of breast: Secondary | ICD-10-CM

## 2015-10-13 DIAGNOSIS — M1611 Unilateral primary osteoarthritis, right hip: Secondary | ICD-10-CM | POA: Diagnosis not present

## 2015-11-02 ENCOUNTER — Ambulatory Visit (HOSPITAL_COMMUNITY)
Admission: RE | Admit: 2015-11-02 | Discharge: 2015-11-02 | Disposition: A | Discharge status: Home / Self Care | Payer: Medicare HMO | Source: Ambulatory Visit | Attending: Family Medicine | Admitting: Family Medicine

## 2015-11-02 DIAGNOSIS — M810 Age-related osteoporosis without current pathological fracture: Secondary | ICD-10-CM

## 2015-11-02 DIAGNOSIS — Z1231 Encounter for screening mammogram for malignant neoplasm of breast: Secondary | ICD-10-CM | POA: Insufficient documentation

## 2015-11-02 DIAGNOSIS — Z13820 Encounter for screening for osteoporosis: Secondary | ICD-10-CM | POA: Insufficient documentation

## 2015-11-11 DIAGNOSIS — Z6828 Body mass index (BMI) 28.0-28.9, adult: Secondary | ICD-10-CM | POA: Diagnosis not present

## 2015-11-11 DIAGNOSIS — Z1389 Encounter for screening for other disorder: Secondary | ICD-10-CM | POA: Diagnosis not present

## 2015-11-11 DIAGNOSIS — M810 Age-related osteoporosis without current pathological fracture: Secondary | ICD-10-CM | POA: Diagnosis not present

## 2015-12-08 DIAGNOSIS — M1711 Unilateral primary osteoarthritis, right knee: Secondary | ICD-10-CM | POA: Diagnosis not present

## 2015-12-08 DIAGNOSIS — M1611 Unilateral primary osteoarthritis, right hip: Secondary | ICD-10-CM | POA: Diagnosis not present

## 2015-12-15 DIAGNOSIS — M1711 Unilateral primary osteoarthritis, right knee: Secondary | ICD-10-CM | POA: Diagnosis not present

## 2015-12-22 DIAGNOSIS — M1711 Unilateral primary osteoarthritis, right knee: Secondary | ICD-10-CM | POA: Diagnosis not present

## 2016-01-19 DIAGNOSIS — X32XXXD Exposure to sunlight, subsequent encounter: Secondary | ICD-10-CM | POA: Diagnosis not present

## 2016-01-19 DIAGNOSIS — L570 Actinic keratosis: Secondary | ICD-10-CM | POA: Diagnosis not present

## 2016-01-19 DIAGNOSIS — D225 Melanocytic nevi of trunk: Secondary | ICD-10-CM | POA: Diagnosis not present

## 2016-02-16 DIAGNOSIS — M1711 Unilateral primary osteoarthritis, right knee: Secondary | ICD-10-CM | POA: Diagnosis not present

## 2016-03-01 DIAGNOSIS — Z6827 Body mass index (BMI) 27.0-27.9, adult: Secondary | ICD-10-CM | POA: Diagnosis not present

## 2016-03-01 DIAGNOSIS — Z23 Encounter for immunization: Secondary | ICD-10-CM | POA: Diagnosis not present

## 2016-03-01 DIAGNOSIS — I10 Essential (primary) hypertension: Secondary | ICD-10-CM | POA: Diagnosis not present

## 2016-03-01 DIAGNOSIS — Z1389 Encounter for screening for other disorder: Secondary | ICD-10-CM | POA: Diagnosis not present

## 2016-03-01 DIAGNOSIS — M159 Polyosteoarthritis, unspecified: Secondary | ICD-10-CM | POA: Diagnosis not present

## 2016-03-01 DIAGNOSIS — J45909 Unspecified asthma, uncomplicated: Secondary | ICD-10-CM | POA: Diagnosis not present

## 2016-04-05 DIAGNOSIS — X32XXXD Exposure to sunlight, subsequent encounter: Secondary | ICD-10-CM | POA: Diagnosis not present

## 2016-04-05 DIAGNOSIS — L570 Actinic keratosis: Secondary | ICD-10-CM | POA: Diagnosis not present

## 2016-04-05 DIAGNOSIS — M1711 Unilateral primary osteoarthritis, right knee: Secondary | ICD-10-CM | POA: Diagnosis not present

## 2016-04-11 DIAGNOSIS — Z6828 Body mass index (BMI) 28.0-28.9, adult: Secondary | ICD-10-CM | POA: Diagnosis not present

## 2016-04-11 DIAGNOSIS — J45909 Unspecified asthma, uncomplicated: Secondary | ICD-10-CM | POA: Diagnosis not present

## 2016-04-11 DIAGNOSIS — Z1389 Encounter for screening for other disorder: Secondary | ICD-10-CM | POA: Diagnosis not present

## 2016-04-11 DIAGNOSIS — Z0001 Encounter for general adult medical examination with abnormal findings: Secondary | ICD-10-CM | POA: Diagnosis not present

## 2016-04-11 DIAGNOSIS — M1991 Primary osteoarthritis, unspecified site: Secondary | ICD-10-CM | POA: Diagnosis not present

## 2016-04-11 DIAGNOSIS — I10 Essential (primary) hypertension: Secondary | ICD-10-CM | POA: Diagnosis not present

## 2016-04-12 DIAGNOSIS — Z1389 Encounter for screening for other disorder: Secondary | ICD-10-CM | POA: Diagnosis not present

## 2016-04-12 DIAGNOSIS — Z6828 Body mass index (BMI) 28.0-28.9, adult: Secondary | ICD-10-CM | POA: Diagnosis not present

## 2016-04-12 DIAGNOSIS — E663 Overweight: Secondary | ICD-10-CM | POA: Diagnosis not present

## 2016-04-13 DIAGNOSIS — R946 Abnormal results of thyroid function studies: Secondary | ICD-10-CM | POA: Diagnosis not present

## 2016-04-20 ENCOUNTER — Encounter: Payer: Self-pay | Admitting: Physician Assistant

## 2016-04-20 DIAGNOSIS — I10 Essential (primary) hypertension: Secondary | ICD-10-CM

## 2016-04-20 DIAGNOSIS — J3089 Other allergic rhinitis: Secondary | ICD-10-CM | POA: Diagnosis present

## 2016-04-20 DIAGNOSIS — M1711 Unilateral primary osteoarthritis, right knee: Secondary | ICD-10-CM | POA: Diagnosis present

## 2016-04-20 NOTE — H&P (Signed)
TOTAL KNEE ADMISSION H&P  Patient is being admitted for right total knee arthroplasty.  Subjective:  Chief Complaint:right knee pain.  HPI: Bailey Wilson, 76 y.o. female, has a history of pain and functional disability in the right knee due to arthritis and has failed non-surgical conservative treatments for greater than 12 weeks to includeNSAID's and/or analgesics, corticosteriod injections, viscosupplementation injections, flexibility and strengthening excercises, supervised PT with diminished ADL's post treatment, use of assistive devices, weight reduction as appropriate and activity modification.  Onset of symptoms was gradual, starting 10 years ago with gradually worsening course since that time. The patient noted no past surgery on the right knee(s).  Patient currently rates pain in the right knee(s) at 10 out of 10 with activity. Patient has night pain, worsening of pain with activity and weight bearing, pain that interferes with activities of daily living, crepitus and joint swelling.  Patient has evidence of subchondral sclerosis, periarticular osteophytes and joint space narrowing by imaging studies. This patient has had  There is no active infection.  Patient Active Problem List   Diagnosis Date Noted  . Essential hypertension 04/20/2016  . Environmental and seasonal allergies   . Primary localized osteoarthritis of right knee    Past Medical History:  Diagnosis Date  . Environmental and seasonal allergies   . Hypertension   . Primary localized osteoarthritis of right knee     Past Surgical History:  Procedure Laterality Date  . ABDOMINAL HYSTERECTOMY    . EYE SURGERY      No current facility-administered medications for this encounter.   Current Outpatient Prescriptions:  .  albuterol (PROVENTIL HFA;VENTOLIN HFA) 108 (90 Base) MCG/ACT inhaler, Inhale 2 puffs into the lungs every 6 (six) hours as needed for wheezing or shortness of breath., Disp: , Rfl:  .  amLODipine  (NORVASC) 5 MG tablet, Take 5 mg by mouth daily., Disp: , Rfl:  .  ARTIFICIAL TEAR OP, Apply 2 drops to eye 2 (two) times daily. For dry eyes, Disp: , Rfl:  .  aspirin EC 81 MG tablet, Take 81 mg by mouth daily., Disp: , Rfl:  .  Calcium Carbonate-Vitamin D (CALCIUM + D) 600-200 MG-UNIT TABS, Take 1 tablet by mouth daily. , Disp: , Rfl:  .  cetirizine (ZYRTEC) 10 MG tablet, Take 10 mg by mouth daily., Disp: , Rfl:  .  Cranberry 250 MG CAPS, Take 500 mg by mouth daily., Disp: , Rfl:  .  hydrochlorothiazide (HYDRODIURIL) 25 MG tablet, Take 25 mg by mouth daily., Disp: , Rfl:  .  HYDROcodone-acetaminophen (NORCO/VICODIN) 5-325 MG tablet, Take 1 tablet by mouth at bedtime as needed for moderate pain., Disp: , Rfl:  .  Multiple Vitamins-Minerals (ICAPS MV PO), Take 1 capsule by mouth daily., Disp: , Rfl:  .  naproxen sodium (ANAPROX) 220 MG tablet, Take 220 mg by mouth daily as needed (PAIN)., Disp: , Rfl:  .  ranitidine (ZANTAC) 150 MG tablet, Take 150 mg by mouth 2 (two) times daily., Disp: , Rfl:  .  sodium chloride (OCEAN) 0.65 % SOLN nasal spray, Place 1 spray into both nostrils as needed for congestion., Disp: , Rfl:  .  traMADol (ULTRAM) 50 MG tablet, Take 1 tablet (50 mg total) by mouth every 6 (six) hours as needed. (Patient not taking: Reported on 04/19/2016), Disp: 20 tablet, Rfl: 0    Allergies  Allergen Reactions  . Aspirin Other (See Comments)    Heart racing (large doses)  . Penicillins Hives and Itching  Has patient had a PCN reaction causing immediate rash, facial/tongue/throat swelling, SOB or lightheadedness with hypotension: Yes Has patient had a PCN reaction causing severe rash involving mucus membranes or skin necrosis: No Has patient had a PCN reaction that required hospitalization No Has patient had a PCN reaction occurring within the last 10 years: No If all of the above answers are "NO", then may proceed with Cephalosporin use.   . Sulfa Antibiotics Itching and Rash     Social History  Substance Use Topics  . Smoking status: Never Smoker  . Smokeless tobacco: Never Used  . Alcohol use No    Family History  Problem Relation Age of Onset  . Asthma Mother   . Arrhythmia Mother   . Heart disease Father   . Clotting disorder Father      Review of Systems  Constitutional: Negative.   HENT: Positive for congestion.   Eyes: Negative.   Respiratory: Positive for cough and shortness of breath.   Cardiovascular: Negative.   Gastrointestinal: Negative.   Genitourinary: Negative.   Musculoskeletal: Positive for back pain and joint pain.  Skin: Negative.   Neurological: Negative.   Endo/Heme/Allergies: Negative.   Psychiatric/Behavioral: Negative.     Objective:  Physical Exam  Constitutional: She is oriented to person, place, and time. She appears well-developed and well-nourished.  HENT:  Head: Normocephalic and atraumatic.  Mouth/Throat: Oropharynx is clear and moist.  Eyes: Conjunctivae are normal. Pupils are equal, round, and reactive to light.  Neck: Neck supple.  Cardiovascular: Normal rate and regular rhythm.   Murmur heard. Respiratory: Effort normal and breath sounds normal.  GI: Soft. Bowel sounds are normal.  Genitourinary: Vagina normal and uterus normal.  Musculoskeletal:  Examination of her right leg reveals anterior, lateral and posterior thigh pain.  No distinct groin pain.  Right buttock pain noted as well.  Full range of motion of the right hip.  Examination of her left hip reveals full range of motion without pain.  Lumbar exam reveals right paralumbar pain radiating into the right buttock.  Right knee exam reveals pain diffusely.  1+ synovitis.  Range of motion -5 to 125 degrees.  Knee is stable with normal patella tracking.  Vascular exam: Pulses are 2+ and symmetric.    Neurological: She is alert and oriented to person, place, and time.  Skin: Skin is warm and dry.  Psychiatric: She has a normal mood and affect. Her behavior  is normal.    Vital signs in last 24 hours: Pulse Rate:  [88] 88 (03/07 1400) BP: (155)/(78) 155/78 (03/07 1400) SpO2:  [96 %] 96 % (03/07 1400) Weight:  [77.7 kg (171 lb 3.2 oz)] 77.7 kg (171 lb 3.2 oz) (03/07 1400)  Labs:   Estimated body mass index is 28.49 kg/m as calculated from the following:   Height as of this encounter: 5\' 5"  (1.651 m).   Weight as of this encounter: 77.7 kg (171 lb 3.2 oz).   Imaging Review Plain radiographs demonstrate severe degenerative joint disease of the right knee(s). The overall alignment issignificant valgus. The bone quality appears to be good for age and reported activity level.  Assessment/Plan:  End stage arthritis, right knee  Patient Active Problem List   Diagnosis Date Noted  . Essential hypertension 04/20/2016  . Environmental and seasonal allergies   . Primary localized osteoarthritis of right knee     The patient history, physical examination, clinical judgment of the provider and imaging studies are consistent with end stage degenerative  joint disease of the right knee(s) and total knee arthroplasty is deemed medically necessary. The treatment options including medical management, injection therapy arthroscopy and arthroplasty were discussed at length. The risks and benefits of total knee arthroplasty were presented and reviewed. The risks due to aseptic loosening, infection, stiffness, patella tracking problems, thromboembolic complications and other imponderables were discussed. The patient acknowledged the explanation, agreed to proceed with the plan and consent was signed. Patient is being admitted for inpatient treatment for surgery, pain control, PT, OT, prophylactic antibiotics, VTE prophylaxis, progressive ambulation and ADL's and discharge planning. The patient is planning to be discharged to skilled nursing facility [facility], [facility], or [facility].  No TXA   Father has a history of clotting disorder.  lovenox post op  Cannot  tolerate 325 mg ASA

## 2016-04-21 NOTE — Pre-Procedure Instructions (Signed)
Bailey Wilson  04/21/2016      [id number] - [place], [place] - [address] AT [address] [place] [address] Phone: [phone] Fax: [phone]    Your procedure is scheduled on March 19  Report to [facility] at 530 A.M.  Call this number if you have problems the morning of surgery:  [phone]   Remember:  Do not eat food or drink liquids after midnight.  Take these medicines the morning of surgery with A SIP OF WATER albuterol inhaler if needed-bring with you  On the day of surgery, amlodipine (Norvasc), Cetirizine (Zyrtec), Hydrocodone(Norco) if needed, ranitidine (Zantac)  Stop taking aspirin, BC's, Goody's, Herbal medications, Fish oil, vitamins, Naproxen (Anaprox), Aleve, Ibuprofen, Advil, Motrin   Do not wear jewelry, make-up or nail polish.  Do not wear lotions, powders, or perfumes, or deoderant.  Do not shave 48 hours prior to surgery.  Men may shave face and neck.  Do not bring valuables to the hospital.  [facility] is not responsible for any belongings or valuables.  Contacts, dentures or bridgework may not be worn into surgery.  Leave your suitcase in the car.  After surgery it may be brought to your room.  For patients admitted to the hospital, discharge time will be determined by your treatment team.  Patients discharged the day of surgery will not be allowed to drive home.    Special instructions:  [place] - Preparing for Surgery  Before surgery, you can play an important role.  Because skin is not sterile, your skin needs to be as free of germs as possible.  You can reduce the number of germs on you skin by washing with CHG (chlorahexidine gluconate) soap before surgery.  CHG is an antiseptic cleaner which kills germs and bonds with the skin to continue killing germs even after washing.  Please DO NOT use if you have an allergy to CHG or antibacterial soaps.  If your skin becomes  reddened/irritated stop using the CHG and inform your nurse when you arrive at Short Stay.  Do not shave (including legs and underarms) for at least 48 hours prior to the first CHG shower.  You may shave your face.  Please follow these instructions carefully:   1.  Shower with CHG Soap the night before surgery and the  morning of Surgery.  2.  If you choose to wash your hair, wash your hair first as usual with your  normal shampoo.  3.  After you shampoo, rinse your hair and body thoroughly to remove the  Shampoo.  4.  Use CHG as you would any other liquid soap.  You can apply chg directly  to the skin and wash gently with scrungie or a clean washcloth.  5.  Apply the CHG Soap to your body ONLY FROM THE NECK DOWN.   Do not use on open wounds or open sores.  Avoid contact with your eyes,       ears, mouth and genitals (private parts).  Wash genitals (private parts)  with your normal soap.  6.  Wash thoroughly, paying special attention to the area where your surgery will be performed.  7.  Thoroughly rinse your body with warm water from the neck down.  8.  DO NOT shower/wash with your normal soap after using and rinsing off  the CHG Soap.  9.  Pat yourself dry with a clean towel.  10.  Wear clean pajamas.            11.  Place clean sheets on your bed the night of your first shower and do not  sleep with pets.  Day of Surgery  Do not apply any lotions/deoderants the morning of surgery.  Please wear clean clothes to the hospital/surgery center.     Please read over the following fact sheets that you were given. Pain Booklet, Coughing and Deep Breathing, MRSA Information and Surgical Site Infection Prevention, Incentive Spirometry

## 2016-04-22 ENCOUNTER — Encounter (HOSPITAL_COMMUNITY)
Admission: RE | Admit: 2016-04-22 | Discharge: 2016-04-22 | Disposition: A | Discharge status: Home / Self Care | Payer: Medicare HMO | Source: Ambulatory Visit | Attending: Orthopedic Surgery | Admitting: Orthopedic Surgery

## 2016-04-22 ENCOUNTER — Ambulatory Visit (HOSPITAL_COMMUNITY)
Admission: RE | Admit: 2016-04-22 | Discharge: 2016-04-22 | Disposition: A | Discharge status: Home / Self Care | Payer: Medicare HMO | Source: Ambulatory Visit | Attending: Physician Assistant | Admitting: Physician Assistant

## 2016-04-22 ENCOUNTER — Encounter (HOSPITAL_COMMUNITY): Payer: Self-pay | Admitting: Emergency Medicine

## 2016-04-22 ENCOUNTER — Encounter (HOSPITAL_COMMUNITY): Payer: Self-pay

## 2016-04-22 DIAGNOSIS — J069 Acute upper respiratory infection, unspecified: Secondary | ICD-10-CM | POA: Insufficient documentation

## 2016-04-22 DIAGNOSIS — Z01810 Encounter for preprocedural cardiovascular examination: Secondary | ICD-10-CM | POA: Diagnosis present

## 2016-04-22 DIAGNOSIS — I517 Cardiomegaly: Secondary | ICD-10-CM | POA: Insufficient documentation

## 2016-04-22 DIAGNOSIS — M4854XA Collapsed vertebra, not elsewhere classified, thoracic region, initial encounter for fracture: Secondary | ICD-10-CM | POA: Diagnosis not present

## 2016-04-22 DIAGNOSIS — R918 Other nonspecific abnormal finding of lung field: Secondary | ICD-10-CM | POA: Diagnosis not present

## 2016-04-22 DIAGNOSIS — Z01812 Encounter for preprocedural laboratory examination: Secondary | ICD-10-CM | POA: Diagnosis not present

## 2016-04-22 HISTORY — DX: Pneumonia, unspecified organism: J18.9

## 2016-04-22 HISTORY — DX: Gastro-esophageal reflux disease without esophagitis: K21.9

## 2016-04-22 HISTORY — DX: Family history of other specified conditions: Z84.89

## 2016-04-22 LAB — COMPREHENSIVE METABOLIC PANEL
ALBUMIN: 3.5 g/dL (ref 3.5–5.0)
ALK PHOS: 77 U/L (ref 38–126)
ALT: 12 U/L — ABNORMAL LOW (ref 14–54)
ANION GAP: 11 (ref 5–15)
AST: 17 U/L (ref 15–41)
BUN: 10 mg/dL (ref 6–20)
CALCIUM: 9.8 mg/dL (ref 8.9–10.3)
CO2: 26 mmol/L (ref 22–32)
Chloride: 88 mmol/L — ABNORMAL LOW (ref 101–111)
Creatinine, Ser: 0.79 mg/dL (ref 0.44–1.00)
GFR calc Af Amer: >60 mL/min (ref >60)
GFR calc non Af Amer: >60 mL/min (ref >60)
GLUCOSE: 100 mg/dL — AB (ref 65–99)
Potassium: 3.4 mmol/L — ABNORMAL LOW (ref 3.5–5.1)
Sodium: 125 mmol/L — ABNORMAL LOW (ref 135–145)
TOTAL PROTEIN: 7.3 g/dL (ref 6.5–8.1)
Total Bilirubin: 0.9 mg/dL (ref 0.3–1.2)

## 2016-04-22 LAB — CBC WITH DIFFERENTIAL/PLATELET
Basophils Absolute: 0 10*3/uL (ref 0.0–0.1)
Basophils Relative: 0 %
Eosinophils Absolute: 0.2 10*3/uL (ref 0.0–0.7)
Eosinophils Relative: 2 %
HEMATOCRIT: 32.9 % — AB (ref 36.0–46.0)
HEMOGLOBIN: 11 g/dL — AB (ref 12.0–15.0)
LYMPHS ABS: 1.9 10*3/uL (ref 0.7–4.0)
LYMPHS PCT: 20 %
MCH: 26.8 pg (ref 26.0–34.0)
MCHC: 33.4 g/dL (ref 30.0–36.0)
MCV: 80 fL (ref 78.0–100.0)
MONOS PCT: 7 %
Monocytes Absolute: 0.7 10*3/uL (ref 0.1–1.0)
NEUTROS ABS: 6.9 10*3/uL (ref 1.7–7.7)
Neutrophils Relative %: 71 %
Platelets: 291 10*3/uL (ref 150–400)
RBC: 4.11 MIL/uL (ref 3.87–5.11)
RDW: 13.5 % (ref 11.5–15.5)
WBC: 9.7 10*3/uL (ref 4.0–10.5)

## 2016-04-22 LAB — SURGICAL PCR SCREEN
MRSA, PCR: NEGATIVE
Staphylococcus aureus: NEGATIVE

## 2016-04-22 LAB — APTT: aPTT: 33 seconds (ref 24–36)

## 2016-04-22 LAB — TYPE AND SCREEN
ABO/RH(D): O POS
Antibody Screen: NEGATIVE

## 2016-04-22 LAB — ABO/RH: ABO/RH(D): O POS

## 2016-04-22 LAB — PROTIME-INR
INR: 1
Prothrombin Time: 13.2 seconds (ref 11.4–15.2)

## 2016-04-22 NOTE — Progress Notes (Signed)
Dr Loraine Leriche office called back states that they will call the patient in to be seen at their office.

## 2016-04-22 NOTE — Progress Notes (Signed)
Na level noted Called Dr Sharilyn Sites spoke with Vicente Males informed her of Na level. States she will let the Dr know and call me back. Dr Archie Endo office called message left with [facility] informed of Na level

## 2016-04-22 NOTE — Progress Notes (Addendum)
pcp is Dr. Sharilyn Sites- Requested EKG and Last office visit note  Denies ever seeing a cardiologist. Denies any chest pain. Denies ever having a card cath, stress test, or echo. Reports she plans to go to [place] at discharge.

## 2016-04-24 LAB — URINE CULTURE

## 2016-04-28 DIAGNOSIS — Z6827 Body mass index (BMI) 27.0-27.9, adult: Secondary | ICD-10-CM | POA: Diagnosis not present

## 2016-04-28 DIAGNOSIS — D649 Anemia, unspecified: Secondary | ICD-10-CM | POA: Diagnosis not present

## 2016-04-28 DIAGNOSIS — E663 Overweight: Secondary | ICD-10-CM | POA: Diagnosis not present

## 2016-04-28 DIAGNOSIS — M1710 Unilateral primary osteoarthritis, unspecified knee: Secondary | ICD-10-CM | POA: Diagnosis not present

## 2016-05-02 ENCOUNTER — Inpatient Hospital Stay (HOSPITAL_COMMUNITY): Admission: RE | Admit: 2016-05-02 | Payer: Medicare HMO | Source: Ambulatory Visit | Admitting: Orthopedic Surgery

## 2016-05-02 ENCOUNTER — Encounter (HOSPITAL_COMMUNITY): Admission: RE | Payer: Self-pay | Source: Ambulatory Visit

## 2016-05-02 HISTORY — DX: Other allergic rhinitis: J30.89

## 2016-05-02 HISTORY — DX: Unilateral primary osteoarthritis, right knee: M17.11

## 2016-05-02 SURGERY — ARTHROPLASTY, KNEE, TOTAL
Anesthesia: General | Site: Knee | Laterality: Right

## 2016-05-05 ENCOUNTER — Ambulatory Visit: Payer: Medicare HMO | Admitting: Cardiovascular Disease

## 2016-05-06 ENCOUNTER — Encounter: Payer: Self-pay | Admitting: Cardiovascular Disease

## 2016-05-06 ENCOUNTER — Ambulatory Visit (INDEPENDENT_AMBULATORY_CARE_PROVIDER_SITE_OTHER): Payer: Medicare HMO | Admitting: Cardiovascular Disease

## 2016-05-06 VITALS — BP 156/78 | HR 108 | Ht 65.5 in | Wt 172.6 lb

## 2016-05-06 DIAGNOSIS — R011 Cardiac murmur, unspecified: Secondary | ICD-10-CM | POA: Insufficient documentation

## 2016-05-06 DIAGNOSIS — R0989 Other specified symptoms and signs involving the circulatory and respiratory systems: Secondary | ICD-10-CM | POA: Diagnosis not present

## 2016-05-06 DIAGNOSIS — Z01818 Encounter for other preprocedural examination: Secondary | ICD-10-CM

## 2016-05-06 DIAGNOSIS — R0602 Shortness of breath: Secondary | ICD-10-CM

## 2016-05-06 DIAGNOSIS — I10 Essential (primary) hypertension: Secondary | ICD-10-CM

## 2016-05-06 MED ORDER — AMLODIPINE BESYLATE 10 MG PO TABS
10.0000 mg | ORAL_TABLET | Freq: Every day | ORAL | 5 refills | Status: DC
Start: 2016-05-06 — End: 2016-07-07

## 2016-05-06 NOTE — Patient Instructions (Addendum)
Medication Instructions:  INCREASE YOUR AMLODIPINE TO 10 MG DAILY   Labwork: NONE  Testing/Procedures: Your physician has requested that you have a carotid duplex. This test is an ultrasound of the carotid arteries in your neck. It looks at blood flow through these arteries that supply the brain with blood. Allow one hour for this exam. There are no restrictions or special instructions.  Your physician has requested that you have a lexiscan myoview. For further information please visit [email or web address]. Please follow instruction sheet, as given.  Your physician has requested that you have an echocardiogram. Echocardiography is a painless test that uses sound waves to create images of your heart. It provides your doctor with information about the size and shape of your heart and how well your heart's chambers and valves are working. This procedure takes approximately one hour. There are no restrictions for this procedure. [address]  Follow-Up: Your physician recommends that you schedule a follow-up appointment in: [place], LOG THEM, AND BRING WITH YOU TO YOUR FOLLOW UP  If you need a refill on your cardiac medications before your next appointment, please call your pharmacy.

## 2016-05-06 NOTE — Progress Notes (Signed)
Cardiology Office Note   Date:  05/06/2016   ID:  Bailey Wilson, DOB 05-28-1940, MRN [id number]  PCP:  Purvis Kilts, MD  Cardiologist:   Skeet Latch, MD  Orthopedic Surgeon: Elsie Saas, MD  Chief Complaint  Patient presents with  . Pre-op Exam     ANB ECG at pre-op irreg HR-knee replacement surgery      History of Present Illness: Bailey Wilson is a 75 y.o. female with hypertension who presents for Presurgical risk assessment.  Bailey Wilson was seen 04/20/16 by Vonda Antigua, PA-C, prior to knee surgery. She was noted to have a murmur on exam. EKG at that time revealed sinus rhythm with frequent PACs. She was referred to cardiology for presurgical risk assessment. She reports that she has been feeling well with the exception of her right knee. She has pain in the knee both at rest and with exertion. It prevents her from getting much exercise. She does endorse shortness of breath with walking minimal distances. She became short of breath when walking from the lobby to the exam room. She also notes this when walking up steps. She denies chest pain or pressure. She does notice swelling in her bilateral ankles that occurs when she had sitting for prolonged periods of time. She denies orthopnea or she has some mild dizziness when she stands up too quickly but denies syncope. She also denies palpitations.  Bailey Wilson checks her blood pressure at home. Typically runs in the 140s/70.  Past Medical History:  Diagnosis Date  . Dyspnea    2 years ago   . Environmental and seasonal allergies   . Family history of adverse reaction to anesthesia    son may have had sleep apnea had problem waking him up  . GERD (gastroesophageal reflux disease)   . Hypertension   . Murmur 05/06/2016  . Pneumonia    10 year ago  . Primary localized osteoarthritis of right knee     Past Surgical History:  Procedure Laterality Date  . ABDOMINAL HYSTERECTOMY    . COLONOSCOPY    . EYE SURGERY     . nose fracture     had repair of fracture nose  . TONSILLECTOMY       Current Outpatient Prescriptions  Medication Sig Dispense Refill  . albuterol (PROVENTIL HFA;VENTOLIN HFA) 108 (90 Base) MCG/ACT inhaler Inhale 2 puffs into the lungs every 6 (six) hours as needed for wheezing or shortness of breath.    [name] amLODipine (NORVASC) 10 MG tablet Take 1 tablet (10 mg total) by mouth daily. 30 tablet 5  . ARTIFICIAL TEAR OP Apply 2 drops to eye 2 (two) times daily. For dry eyes    . aspirin EC 81 MG tablet Take 81 mg by mouth daily.    . Calcium Carbonate-Vitamin D (CALCIUM + D) 600-200 MG-UNIT TABS Take 1 tablet by mouth daily.     . cetirizine (ZYRTEC) 10 MG tablet Take 10 mg by mouth daily.    . Cranberry 250 MG CAPS Take 500 mg by mouth daily.    [name] docusate sodium (COLACE) 100 MG capsule Take 100 mg by mouth daily as needed for mild constipation.    . hydrochlorothiazide (HYDRODIURIL) 25 MG tablet Take 25 mg by mouth daily.    [name] HYDROcodone-acetaminophen (NORCO/VICODIN) 5-325 MG tablet Take 1 tablet by mouth at bedtime as needed for moderate pain.    . Multiple Vitamins-Minerals (ICAPS MV PO) Take 1 capsule by mouth daily.    [name]  nabumetone (RELAFEN) 500 MG tablet Take 500 mg by mouth 2 (two) times daily as needed.    . naproxen sodium (ANAPROX) 220 MG tablet Take 220 mg by mouth daily.    . ranitidine (ZANTAC) 150 MG tablet Take 150 mg by mouth 2 (two) times daily.    . sodium chloride (OCEAN) 0.65 % SOLN nasal spray Place 1 spray into both nostrils as needed for congestion.    . traMADol (ULTRAM) 50 MG tablet Take 1 tablet (50 mg total) by mouth every 6 (six) hours as needed. 20 tablet 0   No current facility-administered medications for this visit.     Allergies:   Ace inhibitors; Aspirin; Penicillins; and Sulfa antibiotics    Social History:  The patient  reports that she has never smoked. She has never used smokeless tobacco. She reports that she does not drink alcohol or use drugs.    Family History:  The patient's family history includes Arrhythmia in her mother; Asthma in her mother; Blindness in her paternal grandmother; Clotting disorder in her father; Heart disease in her father and mother; Hypertension in her paternal grandfather.    ROS:  Please see the history of present illness.   Otherwise, review of systems are positive for none.   All other systems are reviewed and negative.    PHYSICAL EXAM: VS:  BP (!) 156/78   Pulse (!) 108   Ht 5' 5.5" (1.664 m)   Wt 78.3 kg (172 lb 9.6 oz)   SpO2 100%   BMI 28.29 kg/m  , BMI Body mass index is 28.29 kg/m. GENERAL:  Well appearing HEENT:  Pupils equal round and reactive, fundi not visualized, oral mucosa unremarkable NECK:  No jugular venous distention, waveform within normal limits, carotid upstroke brisk and symmetric, no R carotid bruits, no thyromegaly LYMPHATICS:  No cervical adenopathy LUNGS:  Clear to auscultation bilaterally HEART:  RRR.  PMI not displaced or sustained,S1 and S2 within normal limits, no S3, no S4, no clicks, no rubs, II/VI early-peaking systolic murmur at the RUSB ABD:  Flat, positive bowel sounds normal in frequency in pitch, no bruits, no rebound, no guarding, no midline pulsatile mass, no hepatomegaly, no splenomegaly EXT:  2 plus pulses throughout, no edema, no cyanosis no clubbing SKIN:  No rashes no nodules NEURO:  Cranial nerves II through XII grossly intact, motor grossly intact throughout PSYCH:  Cognitively intact, oriented to person place and time  EKG:  EKG is ordered today. The ekg ordered today demonstrates sinus rhythm.  Rate 95 bpm. 04/22/16: Sinus rhythm. Rate 98 bpm. Frequent PACs. LVH with repolarization abnormalities.  Recent Labs: 04/22/2016: ALT 12; BUN 10; Creatinine, Ser 0.79; Hemoglobin 11.0; Platelets 291; Potassium 3.4; Sodium 125   04/22/16: WBC 9.7, hemoglobin 11.0, hematocrit 32.9, platelets 291  Lipid Panel No results found for: CHOL, TRIG, HDL, CHOLHDL,  VLDL, LDLCALC, LDLDIRECT    Wt Readings from Last 3 Encounters:  05/06/16 78.3 kg (172 lb 9.6 oz)  04/22/16 78 kg (172 lb)  07/04/15 77.1 kg (170 lb)      ASSESSMENT AND PLAN:  # Murmur:   # Carotid bruit: Bailey Wilson has a murmur on exam concerning for aortic stenosis. We will obtain an echocardiogram to assess.  She also has a right carotid bruit. I suspect that this is radiation from her aortic stenosis murmur. However, given that it is not bilateral, we will obtain carotid Dopplers as well   # Hypertension:  Blood pressure is above goal today.  We would like it to be less than 130/80.  She notes that it has been elevated at home as well. We will increase amlodipine to 10 mg daily. Continue hydrochlorothiazide.   # Pre-surgical risk assessment:   # Shortness of breath: Bailey Wilson is not very physically active due to knee pain. She reports shortness of breath with minimal activity. We will obtain an echocardiogram as above to assess her murmur. We'll also get a Lexiscan Myoview to better risk stratify prior to surgery.   Current medicines are reviewed at length with the patient today.  The patient does not have concerns regarding medicines.  The following changes have been made:  Increase amlodipine to 10 mg daily  Labs/ tests ordered today include:   Orders Placed This Encounter  Procedures  . Myocardial Perfusion Imaging  . EKG 12-Lead  . ECHOCARDIOGRAM COMPLETE     Disposition:   FU with Nilton Lave C. Oval Linsey, MD, [facility] in 1 month    This note was written with the assistance of speech recognition software.  Please excuse any transcriptional errors.  Signed, Tobie Hellen C. Oval Linsey, MD, [facility]  05/06/2016 2:40 PM    [facility]

## 2016-05-19 ENCOUNTER — Telehealth: Payer: Self-pay | Admitting: Cardiovascular Disease

## 2016-05-19 NOTE — Telephone Encounter (Signed)
Received records from [facility] for appointment on 06/03/16 with Dr Oval Linsey.  Records put with Dr Blenda Mounts schedule for 06/03/16. lp

## 2016-05-20 ENCOUNTER — Telehealth (HOSPITAL_COMMUNITY): Payer: Self-pay

## 2016-05-20 NOTE — Telephone Encounter (Signed)
Encounter complete. 

## 2016-05-25 ENCOUNTER — Ambulatory Visit (HOSPITAL_BASED_OUTPATIENT_CLINIC_OR_DEPARTMENT_OTHER): Payer: Medicare HMO

## 2016-05-25 ENCOUNTER — Ambulatory Visit (HOSPITAL_COMMUNITY)
Admission: RE | Admit: 2016-05-25 | Discharge: 2016-05-25 | Disposition: A | Discharge status: Home / Self Care | Payer: Medicare HMO | Source: Ambulatory Visit | Attending: Cardiology | Admitting: Cardiology

## 2016-05-25 ENCOUNTER — Other Ambulatory Visit: Payer: Self-pay

## 2016-05-25 DIAGNOSIS — R011 Cardiac murmur, unspecified: Secondary | ICD-10-CM | POA: Diagnosis not present

## 2016-05-25 DIAGNOSIS — R0602 Shortness of breath: Secondary | ICD-10-CM | POA: Diagnosis not present

## 2016-05-25 DIAGNOSIS — Z01818 Encounter for other preprocedural examination: Secondary | ICD-10-CM | POA: Diagnosis not present

## 2016-05-25 DIAGNOSIS — I340 Nonrheumatic mitral (valve) insufficiency: Secondary | ICD-10-CM | POA: Insufficient documentation

## 2016-05-25 DIAGNOSIS — R0989 Other specified symptoms and signs involving the circulatory and respiratory systems: Secondary | ICD-10-CM | POA: Diagnosis not present

## 2016-05-25 LAB — MYOCARDIAL PERFUSION IMAGING
CHL CUP NUCLEAR SDS: 2
CHL CUP NUCLEAR SRS: 0
CHL CUP RESTING HR STRESS: 97 {beats}/min
CSEPPHR: 110 {beats}/min
LV dias vol: 81 mL (ref 46–106)
LV sys vol: 20 mL
SSS: 2
TID: 1.09

## 2016-05-25 LAB — ECHOCARDIOGRAM COMPLETE
HEIGHTINCHES: 65 in
Weight: 2752 oz

## 2016-05-25 MED ORDER — REGADENOSON 0.4 MG/5ML IV SOLN
0.4000 mg | Freq: Once | INTRAVENOUS | Status: AC
  Administered 2016-05-25: 0.4 mg via INTRAVENOUS

## 2016-05-25 MED ORDER — TECHNETIUM TC 99M TETROFOSMIN IV KIT
10.6000 | PACK | Freq: Once | INTRAVENOUS | Status: AC | PRN
  Administered 2016-05-25: 10.6 via INTRAVENOUS
  Filled 2016-05-25: qty 11

## 2016-05-25 MED ORDER — TECHNETIUM TC 99M TETROFOSMIN IV KIT
30.2000 | PACK | Freq: Once | INTRAVENOUS | Status: AC | PRN
  Administered 2016-05-25: 30.2 via INTRAVENOUS
  Filled 2016-05-25: qty 31

## 2016-06-03 ENCOUNTER — Ambulatory Visit (INDEPENDENT_AMBULATORY_CARE_PROVIDER_SITE_OTHER): Payer: Medicare HMO | Admitting: Cardiovascular Disease

## 2016-06-03 ENCOUNTER — Encounter: Payer: Self-pay | Admitting: Cardiovascular Disease

## 2016-06-03 ENCOUNTER — Encounter: Payer: Self-pay | Admitting: *Deleted

## 2016-06-03 VITALS — BP 136/76 | HR 96 | Ht 65.5 in | Wt 169.0 lb

## 2016-06-03 DIAGNOSIS — I6523 Occlusion and stenosis of bilateral carotid arteries: Secondary | ICD-10-CM | POA: Diagnosis not present

## 2016-06-03 DIAGNOSIS — Z01818 Encounter for other preprocedural examination: Secondary | ICD-10-CM

## 2016-06-03 DIAGNOSIS — I10 Essential (primary) hypertension: Secondary | ICD-10-CM

## 2016-06-03 DIAGNOSIS — I6529 Occlusion and stenosis of unspecified carotid artery: Secondary | ICD-10-CM

## 2016-06-03 HISTORY — DX: Occlusion and stenosis of unspecified carotid artery: I65.29

## 2016-06-03 NOTE — Patient Instructions (Addendum)
Medication Instructions:  Your physician recommends that you continue on your current medications as directed. Please refer to the Current Medication list given to you today.  Labwork: Requested lipid panel from your Primary Care   Testing/Procedures: Your physician has requested that you have a carotid duplex. This test is an ultrasound of the carotid arteries in your neck. It looks at blood flow through these arteries that supply the brain with blood. Allow one hour for this exam. There are no restrictions or special instructions. 1 YEAR PRIOR TO OFFICE VISIT   Follow-Up: Your physician wants you to follow-up in: 1 [place] will receive a reminder letter in the mail two months in advance. If you don't receive a letter, please call our office to schedule the follow-up appointment.  Any Other Special Instructions Will Be Listed Below (If Applicable). YOU HAVE BEEN CLEARED FOR SURGERY AND WILL BE SENDING DR [place] to hold your Aspirin prior as directed by Dr Noemi Chapel   If you need a refill on your cardiac medications before your next appointment, please call your pharmacy.

## 2016-06-03 NOTE — Progress Notes (Signed)
Cardiology Office Note   Date:  06/05/2016   ID:  Bailey Wilson, DOB 1940-12-19, MRN [id number]  PCP:  Purvis Kilts, MD  Cardiologist:   Skeet Latch, MD  Orthopedic Surgeon: Elsie Saas, MD  No chief complaint on file.     History of Present Illness: Bailey Wilson is a 76 y.o. female with hypertension, mild bilateral carotid stenosis, and mild ascending aortic aneurysm who presents for follow-up. Bailey Wilson was first seen 04/2016 for presurgical risk assessment.  Bailey Wilson was seen 04/20/16 by Bailey Antigua, PA-C, prior to knee surgery. She was noted to have a murmur on exam. EKG at that time revealed sinus rhythm with frequent PACs. She was referred for an echo 05/25/16 that revealed LVEF 55-60% with grade 1 diastolic dysfunction. She also had a mild ascending aortic aneurysm of 3.9 cm. She had no significant valvular abnormalities. Carotid Dopplers revealed 1-39% ICA stenosis bilaterally. She was referred to cardiology for presurgical risk assessment. Given that she is not very physically active she was referred for Bailey Wilson 05/2016 that showed LVEF 75% and no ischemia. At her last appointment her blood pressure was poorly controlled. Amlodipine was increased to 10 mg daily.  Ms. [name] has been monitoring her blood pressures at home. They've been mostly in the 854'O to 270J systolic. She has very rare episodes in the 140s  An occasional systolic blood pressures in the 100s. She has been feeling well and her only complaint is pain in her right knee. She feels that her swelling in her feet has improved since her last appointment. She denies orthopnea, PND, or chest pain.  She is eager to have her right knee replacement.   Past Medical History:  Diagnosis Date  . Carotid stenosis 06/03/2016   1-39% bilateral ICA stenosis 05/2016.  Bailey Wilson Dyspnea    2 years ago   . Environmental and seasonal allergies   . Family history of adverse reaction to anesthesia    son may have  had sleep apnea had problem waking him up  . GERD (gastroesophageal reflux disease)   . Hypertension   . Murmur 05/06/2016  . Pneumonia    10 year ago  . Primary localized osteoarthritis of right knee     Past Surgical History:  Procedure Laterality Date  . ABDOMINAL HYSTERECTOMY    . COLONOSCOPY    . EYE SURGERY    . nose fracture     had repair of fracture nose  . TONSILLECTOMY       Current Outpatient Prescriptions  Medication Sig Dispense Refill  . albuterol (PROVENTIL HFA;VENTOLIN HFA) 108 (90 Base) MCG/ACT inhaler Inhale 2 puffs into the lungs every 6 (six) hours as needed for wheezing or shortness of breath.    Bailey Wilson amLODipine (NORVASC) 10 MG tablet Take 1 tablet (10 mg total) by mouth daily. 30 tablet 5  . ARTIFICIAL TEAR OP Apply 2 drops to eye 2 (two) times daily. For dry eyes    . aspirin EC 81 MG tablet Take 81 mg by mouth daily.    . Calcium Carbonate-Vitamin D (CALCIUM + D) 600-200 MG-UNIT TABS Take 1 tablet by mouth daily.     . cetirizine (ZYRTEC) 10 MG tablet Take 10 mg by mouth daily.    . Cranberry 250 MG CAPS Take 500 mg by mouth daily.    Bailey Wilson docusate sodium (COLACE) 100 MG capsule Take 100 mg by mouth daily as needed for mild constipation.    . hydrochlorothiazide (  HYDRODIURIL) 25 MG tablet Take 25 mg by mouth daily.    Bailey Wilson HYDROcodone-acetaminophen (NORCO/VICODIN) 5-325 MG tablet Take 1 tablet by mouth at bedtime as needed for moderate pain.    . Multiple Vitamins-Minerals (ICAPS MV PO) Take 1 capsule by mouth daily.    . nabumetone (RELAFEN) 500 MG tablet Take 500 mg by mouth 2 (two) times daily as needed.    . naproxen sodium (ANAPROX) 220 MG tablet Take 220 mg by mouth daily.    . ranitidine (ZANTAC) 150 MG tablet Take 150 mg by mouth 2 (two) times daily.    . sodium chloride (OCEAN) 0.65 % SOLN nasal spray Place 1 spray into both nostrils as needed for congestion.    . traMADol (ULTRAM) 50 MG tablet Take 1 tablet (50 mg total) by mouth every 6 (six) hours as  needed. 20 tablet 0   No current facility-administered medications for this visit.     Allergies:   Ace inhibitors; Aspirin; Penicillins; and Sulfa antibiotics    Social History:  The patient  reports that she has never smoked. She has never used smokeless tobacco. She reports that she does not drink alcohol or use drugs.   Family History:  The patient's family history includes Arrhythmia in her mother; Asthma in her mother; Blindness in her paternal grandmother; Clotting disorder in her father; Heart disease in her father and mother; Hypertension in her paternal grandfather.    ROS:  Please see the history of present illness.   Otherwise, review of systems are positive for none.   All other systems are reviewed and negative.    PHYSICAL EXAM: VS:  BP 136/76   Pulse 96   Ht 5' 5.5" (1.664 m)   Wt 76.7 kg (169 lb)   BMI 27.70 kg/m  , BMI Body mass index is 27.7 kg/m. GENERAL:  Well appearing.  No acute distress HEENT:  Pupils equal round and reactive, fundi not visualized, oral mucosa unremarkable NECK:  No jugular venous distention, waveform within normal limits, carotid upstroke brisk and symmetric, no R carotid bruits LYMPHATICS:  No cervical adenopathy LUNGS:  Clear to auscultation bilaterally HEART:  RRR.  PMI not displaced or sustained,S1 and S2 within normal limits, no S3, no S4, no clicks, no rubs, II/VI early-peaking systolic murmur at the RUSB ABD:  Flat, positive bowel sounds normal in frequency in pitch, no bruits, no rebound, no guarding, no midline pulsatile mass, no hepatomegaly, no splenomegaly EXT:  2 plus pulses throughout, no edema, no cyanosis no clubbing SKIN:  No rashes no nodules NEURO:  Cranial nerves II through XII grossly intact, motor grossly intact throughout PSYCH:  Cognitively intact, oriented to person place and time  EKG:  EKG is ordered today. The ekg ordered today demonstrates sinus rhythm.  Rate 95 bpm. 04/22/16: Sinus rhythm. Rate 98 bpm.  Frequent PACs. LVH with repolarization abnormalities.  Echo 05/25/16: Study Conclusions  - Left ventricle: The cavity size was normal. There was mild   concentric hypertrophy. Systolic function was normal. The   estimated ejection fraction was in the range of 55% to 60%. Wall   motion was normal; there were no regional wall motion   abnormalities. Doppler parameters are consistent with abnormal   left ventricular relaxation (grade 1 diastolic dysfunction). - Aorta: Ascending aortic diameter: 39 mm (S). - Ascending aorta: The ascending aorta was mildly dilated. - Mitral valve: There was trivial regurgitation.  Carotid Doppler 05/26/16: 1-39% ICA stenosis bilaterally.  Lexiscan Myoview 05/25/16: The left  ventricular ejection fraction is hyperdynamic (>65%).  Nuclear stress EF: 75%.  There was no ST segment deviation noted during stress.  No T wave inversion was noted during stress.  The study is normal. This is a low risk study   Recent Labs: 04/22/2016: ALT 12; BUN 10; Creatinine, Ser 0.79; Hemoglobin 11.0; Platelets 291; Potassium 3.4; Sodium 125   04/22/16: WBC 9.7, hemoglobin 11.0, hematocrit 32.9, platelets 291  Lipid Panel No results found for: CHOL, TRIG, HDL, CHOLHDL, VLDL, LDLCALC, LDLDIRECT    Wt Readings from Last 3 Encounters:  06/03/16 76.7 kg (169 lb)  05/25/16 78 kg (172 lb)  05/06/16 78.3 kg (172 lb 9.6 oz)      ASSESSMENT AND PLAN:  # Pre-surgical risk assessment:   Lexiscan Myoview was negative for ischemia.  Ms. Alinda Dooms is at acceptable risk for surgery.  OK to hold aspirin 3 days prior to surgery.  # Carotid stenosis: Mild ICA stenosis bilaterally.  Continue aspirin.  LDL should be <70.  She reports lipids were recently checked with her PCP.  We will get those records.  # Hypertension:  Blood pressure is above goal today but has been well-controlled at home.  Continue amlodipine and HCTZ.        Current medicines are reviewed at length with the  patient today.  The patient does not have concerns regarding medicines.  The following changes have been made:  none Labs/ tests ordered today include:   No orders of the defined types were placed in this encounter.    Disposition:   FU with Hajra [place] C. Oval Linsey, MD, [facility] in 1 year.   This note was written with the assistance of speech recognition software.  Please excuse any transcriptional errors.  Signed, Willeen Novak C. Oval Linsey, MD, [facility]  06/05/2016 5:07 PM    [place]

## 2016-06-09 ENCOUNTER — Telehealth: Payer: Self-pay | Admitting: Cardiovascular Disease

## 2016-06-09 NOTE — Telephone Encounter (Signed)
New message      Pt was seen 06-03-16.  Calling to see if Dr Oval Linsey ok'd surgery with Dr Noemi Chapel.  If  Not, please fax something to clear her to them so that she can get her knee fixed.  If any problems, please call pt

## 2016-06-09 NOTE — Telephone Encounter (Signed)
Spoke to [facility] at Dr. Archie Endo office who voiced that the recent OV note mentioning clearance instructions would be sufficient for their purposes. Faxed this to number provided: [phone]  I have contacted pt to let her know of status - she voiced understanding and thanks.

## 2016-06-10 ENCOUNTER — Ambulatory Visit: Payer: Self-pay | Admitting: Cardiovascular Disease

## 2016-06-22 DIAGNOSIS — M1611 Unilateral primary osteoarthritis, right hip: Secondary | ICD-10-CM | POA: Diagnosis not present

## 2016-06-22 DIAGNOSIS — N186 End stage renal disease: Secondary | ICD-10-CM | POA: Diagnosis not present

## 2016-06-22 DIAGNOSIS — M6281 Muscle weakness (generalized): Secondary | ICD-10-CM | POA: Diagnosis not present

## 2016-06-23 ENCOUNTER — Other Ambulatory Visit: Payer: Self-pay | Admitting: Orthopedic Surgery

## 2016-06-23 DIAGNOSIS — M25551 Pain in right hip: Secondary | ICD-10-CM | POA: Diagnosis not present

## 2016-06-24 ENCOUNTER — Encounter (HOSPITAL_COMMUNITY)
Admission: RE | Admit: 2016-06-24 | Discharge: 2016-06-24 | Disposition: A | Discharge status: Home / Self Care | Payer: Medicare HMO | Source: Ambulatory Visit | Attending: Orthopedic Surgery | Admitting: Orthopedic Surgery

## 2016-06-24 ENCOUNTER — Encounter (HOSPITAL_COMMUNITY): Payer: Self-pay

## 2016-06-24 DIAGNOSIS — Z471 Aftercare following joint replacement surgery: Secondary | ICD-10-CM | POA: Diagnosis not present

## 2016-06-24 DIAGNOSIS — M1611 Unilateral primary osteoarthritis, right hip: Secondary | ICD-10-CM | POA: Diagnosis not present

## 2016-06-24 DIAGNOSIS — Z01818 Encounter for other preprocedural examination: Secondary | ICD-10-CM | POA: Diagnosis not present

## 2016-06-24 HISTORY — DX: Anemia, unspecified: D64.9

## 2016-06-24 LAB — BASIC METABOLIC PANEL
ANION GAP: 12 (ref 5–15)
BUN: 28 mg/dL — ABNORMAL HIGH (ref 6–20)
CALCIUM: 10 mg/dL (ref 8.9–10.3)
CO2: 25 mmol/L (ref 22–32)
Chloride: 99 mmol/L — ABNORMAL LOW (ref 101–111)
Creatinine, Ser: 1.24 mg/dL — ABNORMAL HIGH (ref 0.44–1.00)
GFR, EST AFRICAN AMERICAN: 47 mL/min — AB (ref >60)
GFR, EST NON AFRICAN AMERICAN: 41 mL/min — AB (ref >60)
Glucose, Bld: 96 mg/dL (ref 65–99)
Potassium: 3.5 mmol/L (ref 3.5–5.1)
SODIUM: 136 mmol/L (ref 135–145)

## 2016-06-24 LAB — CBC
HCT: 31.2 % — ABNORMAL LOW (ref 36.0–46.0)
HEMOGLOBIN: 10.1 g/dL — AB (ref 12.0–15.0)
MCH: 26.4 pg (ref 26.0–34.0)
MCHC: 32.4 g/dL (ref 30.0–36.0)
MCV: 81.7 fL (ref 78.0–100.0)
Platelets: 263 10*3/uL (ref 150–400)
RBC: 3.82 MIL/uL — AB (ref 3.87–5.11)
RDW: 13.7 % (ref 11.5–15.5)
WBC: 7.8 10*3/uL (ref 4.0–10.5)

## 2016-06-24 LAB — SURGICAL PCR SCREEN
MRSA, PCR: NEGATIVE
STAPHYLOCOCCUS AUREUS: NEGATIVE

## 2016-06-24 NOTE — Progress Notes (Signed)
ECHO  05-25-2016  Stress Test  05-25-2016  Cardiac Clearance  Dr. Skeet Latch 06-05-2016

## 2016-06-24 NOTE — Pre-Procedure Instructions (Signed)
Bailey Wilson  06/24/2016      [id number] - [place], [place] - [address] [place] [address] Phone: [phone] Fax: [phone]    Your procedure is scheduled on 07-04-2016  Monday   Report to [facility] Admitting at 7:30 A.M.   Call this number if you have problems the morning of surgery:  [phone]   Remember:  Do not eat food or drink liquids after midnight.    Take these medicines the morning of surgery with A SIP OF WATER Albuterol inhaler  As needed,Amlodipine(Norvasc),Artificial Tears,cetirizine(Zyrtec),v nabumetone(Relafen) if needed,ranitidine(Zantac),Ocean nasal spray if needed   STOP ASPIRIN,ANTIINFLAMATORIES (IBUPROFEN,ALEVE,MOTRIN,ADVIL,GOODY'S POWDERS),HERBAL SUPPLEMENTS,FISH OIL,AND VITAMINS 5-7 DAYS PRIOR TO SURGERY     Do not wear jewelry, make-up or nail polish.  Do not wear lotions, powders, or perfumes, or deoderant.  Do not shave 48 hours prior to surgery.  Men may shave face and neck.  Do not bring valuables to the hospital.  [facility] is not responsible for any belongings or valuables.  Contacts, dentures or bridgework may not be worn into surgery.  Leave your suitcase in the car.  After surgery it may be brought to your room.  For patients admitted to the hospital, discharge time will be determined by your treatment team.  Patients discharged the day of surgery will not be allowed to drive home.      Special Instructions: [place] - Preparing for Surgery  Before surgery, you can play an important role.  Because skin is not sterile, your skin needs to be as free of germs as possible.  You can reduce the number of germs on you skin by washing with CHG (chlorahexidine gluconate) soap before surgery.  CHG is an antiseptic cleaner which kills germs and bonds with the skin to continue killing germs even after washing.  Please DO NOT use if you have an allergy to CHG or  antibacterial soaps.  If your skin becomes reddened/irritated stop using the CHG and inform your nurse when you arrive at Short Stay.  Do not shave (including legs and underarms) for at least 48 hours prior to the first CHG shower.  You may shave your face.  Please follow these instructions carefully:   1.  Shower with CHG Soap the night before surgery and the   morning of Surgery.  2.  If you choose to wash your hair, wash your hair first as usual with your normal shampoo.  3.  After you shampoo, rinse your hair and body thoroughly to remove the  Shampoo.  4.  Use CHG as you would any other liquid soap.  You can apply chg directly  to the skin and wash gently with scrungie or a clean washcloth.  5.  Apply the CHG Soap to your body ONLY FROM THE NECK DOWN.   Do not use on open wounds or open sores.  Avoid contact with your eyes,  ears, mouth and genitals (private parts).  Wash genitals (private parts) with your normal soap.  6.  Wash thoroughly, paying special attention to the area where your surgery will be performed.  7.  Thoroughly rinse your body with warm water from the neck down.  8.  DO NOT shower/wash with your normal soap after using and rinsing o  the CHG Soap.  9.  Pat yourself dry with a clean towel.            10.  Wear clean  pajamas.            11.  Place clean sheets on your bed the night of your first shower and do not sleep with pets.  Day of Surgery  Do not apply any lotions/deodorants the morning of surgery.  Please wear clean clothes to the hospital/surgery center.      Please read over the following fact sheets that you were given. MRSA Information and Surgical Site Infection Prevention

## 2016-07-01 MED ORDER — TRANEXAMIC ACID 1000 MG/10ML IV SOLN
2000.0000 mg | INTRAVENOUS | Status: AC
  Administered 2016-07-04: 2000 mg via TOPICAL
  Filled 2016-07-01: qty 20

## 2016-07-01 NOTE — H&P (Signed)
TOTAL HIP ADMISSION H&P  Patient is admitted for right total hip arthroplasty.  Subjective:  Chief Complaint: right hip pain  HPI: Bailey Wilson, 76 y.o. female, has a history of pain and functional disability in the right hip(s) due to arthritis and patient has failed non-surgical conservative treatments for greater than 12 weeks to include NSAID's and/or analgesics, flexibility and strengthening excercises, use of assistive devices, weight reduction as appropriate and activity modification.  Onset of symptoms was gradual starting several years ago with gradually worsening course since that time.The patient noted no past surgery on the right hip(s).  Patient currently rates pain in the right hip at 10 out of 10 with activity. Patient has night pain, worsening of pain with activity and weight bearing, trendelenberg gait, pain that interfers with activities of daily living and pain with passive range of motion. Patient has evidence of periarticular osteophytes, joint subluxation and joint space narrowing by imaging studies. This condition presents safety issues increasing the risk of falls.  There is no current active infection.  Patient Active Problem List   Diagnosis Date Noted  . Carotid stenosis 06/03/2016  . Murmur 05/06/2016  . Essential hypertension 04/20/2016  . Environmental and seasonal allergies   . Primary localized osteoarthritis of right knee    Past Medical History:  Diagnosis Date  . Anemia   . Carotid stenosis 06/03/2016   1-39% bilateral ICA stenosis 05/2016.  . Environmental and seasonal allergies   . Family history of adverse reaction to anesthesia    son may have had sleep apnea had problem waking him up  . GERD (gastroesophageal reflux disease)   . Hypertension   . Murmur 05/06/2016  . Pneumonia    10 year ago  . Primary localized osteoarthritis of right knee     Past Surgical History:  Procedure Laterality Date  . ABDOMINAL HYSTERECTOMY    . COLONOSCOPY    .  EYE SURGERY Bilateral    cataracts  . nose fracture     had repair of fracture nose  . TONSILLECTOMY      No prescriptions prior to admission.   Allergies  Allergen Reactions  . Ace Inhibitors Cough  . Aspirin Other (See Comments)    Heart racing (large doses)  . Penicillins Hives and Itching    Has patient had a PCN reaction causing immediate rash, facial/tongue/throat swelling, SOB or lightheadedness with hypotension: Yes Has patient had a PCN reaction causing severe rash involving mucus membranes or skin necrosis: No Has patient had a PCN reaction that required hospitalization No Has patient had a PCN reaction occurring within the last 10 years: No If all of the above answers are "NO", then may proceed with Cephalosporin use.   . Sulfa Antibiotics Itching and Rash    Social History  Substance Use Topics  . Smoking status: Never Smoker  . Smokeless tobacco: Never Used  . Alcohol use No    Family History  Problem Relation Age of Onset  . Asthma Mother   . Arrhythmia Mother   . Heart disease Mother   . Heart disease Father   . Clotting disorder Father   . Hypertension Paternal Grandfather   . Blindness Paternal Grandmother      Review of Systems  Constitutional: Negative.   HENT: Positive for tinnitus.        Sinus problems  Eyes: Negative.   Respiratory: Positive for shortness of breath.   Cardiovascular: Positive for leg swelling.  HTN  Gastrointestinal: Negative.   Genitourinary: Negative.   Musculoskeletal: Positive for joint pain.  Skin: Negative.   Neurological: Negative.   Endo/Heme/Allergies: Bruises/bleeds easily.  Psychiatric/Behavioral: Negative.     Objective:  Physical Exam  Constitutional: She is oriented to person, place, and time. She appears well-developed and well-nourished.  HENT:  Head: Normocephalic and atraumatic.  Eyes: Pupils are equal, round, and reactive to light.  Neck: Normal range of motion. Neck supple.   Cardiovascular: Intact distal pulses.   Respiratory: Effort normal.  Musculoskeletal: She exhibits tenderness.  Patient walks with a profound right-sided limp.  Any attempts at internal rotation the right hip causes severe pain.  The skin over the hip is intact, specifically over the anterior lateral and posterior aspects.  Flexion is limited to about 95 and internal rotation blocks at -5.    Neurological: She is alert and oriented to person, place, and time.  Skin: Skin is warm and dry.  Psychiatric: She has a normal mood and affect. Her behavior is normal. Judgment and thought content normal.    Vital signs in last 24 hours:    Labs:   Estimated body mass index is 27.96 kg/m as calculated from the following:   Height as of this encounter: 5\' 5"  (1.651 m).   Weight as of this encounter: 76.2 kg (168 lb).   Imaging Review Plain radiographs demonstrate end-stage arthritis with partial collapse of the femoral head and lateral subluxation of the femoral head partially out of the acetabulum.  Assessment/Plan:  End stage arthritis, right hip(s)  The patient history, physical examination, clinical judgement of the provider and imaging studies are consistent with end stage degenerative joint disease of the right hip(s) and total hip arthroplasty is deemed medically necessary. The treatment options including medical management, injection therapy, arthroscopy and arthroplasty were discussed at length. The risks and benefits of total hip arthroplasty were presented and reviewed. The risks due to aseptic loosening, infection, stiffness, dislocation/subluxation,  thromboembolic complications and other imponderables were discussed.  The patient acknowledged the explanation, agreed to proceed with the plan and consent was signed. Patient is being admitted for inpatient treatment for surgery, pain control, PT, OT, prophylactic antibiotics, VTE prophylaxis, progressive ambulation and ADL's and  discharge planning.The patient is planning to be discharged home with home health services

## 2016-07-04 ENCOUNTER — Inpatient Hospital Stay (HOSPITAL_COMMUNITY): Payer: Medicare HMO

## 2016-07-04 ENCOUNTER — Encounter (HOSPITAL_COMMUNITY): Payer: Self-pay

## 2016-07-04 ENCOUNTER — Inpatient Hospital Stay (HOSPITAL_COMMUNITY)
Admission: RE | Admit: 2016-07-04 | Discharge: 2016-07-06 | DRG: 470 | Disposition: A | Discharge status: Skilled Nursing Facility | Payer: Medicare HMO | Source: Ambulatory Visit | Attending: Orthopedic Surgery | Admitting: Orthopedic Surgery

## 2016-07-04 ENCOUNTER — Inpatient Hospital Stay (HOSPITAL_COMMUNITY): Payer: Medicare HMO | Admitting: Anesthesiology

## 2016-07-04 ENCOUNTER — Encounter (HOSPITAL_COMMUNITY)
Admission: RE | Disposition: A | Discharge status: Skilled Nursing Facility | Payer: Self-pay | Source: Ambulatory Visit | Attending: Orthopedic Surgery

## 2016-07-04 DIAGNOSIS — R262 Difficulty in walking, not elsewhere classified: Secondary | ICD-10-CM | POA: Diagnosis not present

## 2016-07-04 DIAGNOSIS — Z882 Allergy status to sulfonamides status: Secondary | ICD-10-CM | POA: Diagnosis not present

## 2016-07-04 DIAGNOSIS — Z8249 Family history of ischemic heart disease and other diseases of the circulatory system: Secondary | ICD-10-CM

## 2016-07-04 DIAGNOSIS — I6529 Occlusion and stenosis of unspecified carotid artery: Secondary | ICD-10-CM | POA: Diagnosis not present

## 2016-07-04 DIAGNOSIS — Z419 Encounter for procedure for purposes other than remedying health state, unspecified: Secondary | ICD-10-CM

## 2016-07-04 DIAGNOSIS — I10 Essential (primary) hypertension: Secondary | ICD-10-CM | POA: Diagnosis present

## 2016-07-04 DIAGNOSIS — Z90710 Acquired absence of both cervix and uterus: Secondary | ICD-10-CM

## 2016-07-04 DIAGNOSIS — S79911A Unspecified injury of right hip, initial encounter: Secondary | ICD-10-CM | POA: Diagnosis not present

## 2016-07-04 DIAGNOSIS — M1611 Unilateral primary osteoarthritis, right hip: Secondary | ICD-10-CM | POA: Diagnosis not present

## 2016-07-04 DIAGNOSIS — M6281 Muscle weakness (generalized): Secondary | ICD-10-CM | POA: Diagnosis not present

## 2016-07-04 DIAGNOSIS — G8929 Other chronic pain: Secondary | ICD-10-CM | POA: Diagnosis not present

## 2016-07-04 DIAGNOSIS — Z886 Allergy status to analgesic agent status: Secondary | ICD-10-CM

## 2016-07-04 DIAGNOSIS — M1711 Unilateral primary osteoarthritis, right knee: Secondary | ICD-10-CM | POA: Diagnosis present

## 2016-07-04 DIAGNOSIS — Z96641 Presence of right artificial hip joint: Secondary | ICD-10-CM | POA: Diagnosis not present

## 2016-07-04 DIAGNOSIS — D62 Acute posthemorrhagic anemia: Secondary | ICD-10-CM | POA: Diagnosis not present

## 2016-07-04 DIAGNOSIS — K219 Gastro-esophageal reflux disease without esophagitis: Secondary | ICD-10-CM | POA: Diagnosis not present

## 2016-07-04 DIAGNOSIS — Z880 Allergy status to penicillin: Secondary | ICD-10-CM

## 2016-07-04 DIAGNOSIS — J45909 Unspecified asthma, uncomplicated: Secondary | ICD-10-CM | POA: Diagnosis not present

## 2016-07-04 DIAGNOSIS — R011 Cardiac murmur, unspecified: Secondary | ICD-10-CM | POA: Diagnosis not present

## 2016-07-04 DIAGNOSIS — D509 Iron deficiency anemia, unspecified: Secondary | ICD-10-CM | POA: Diagnosis not present

## 2016-07-04 DIAGNOSIS — Z96643 Presence of artificial hip joint, bilateral: Secondary | ICD-10-CM | POA: Diagnosis not present

## 2016-07-04 DIAGNOSIS — M1990 Unspecified osteoarthritis, unspecified site: Secondary | ICD-10-CM | POA: Diagnosis not present

## 2016-07-04 HISTORY — PX: TOTAL HIP ARTHROPLASTY: SHX124

## 2016-07-04 HISTORY — DX: Unspecified asthma, uncomplicated: J45.909

## 2016-07-04 HISTORY — DX: Unspecified osteoarthritis, unspecified site: M19.90

## 2016-07-04 LAB — TYPE AND SCREEN
ABO/RH(D): O POS
ANTIBODY SCREEN: NEGATIVE

## 2016-07-04 LAB — PROTIME-INR
INR: 1.06
Prothrombin Time: 13.8 seconds (ref 11.4–15.2)

## 2016-07-04 LAB — APTT: APTT: 32 s (ref 24–36)

## 2016-07-04 SURGERY — ARTHROPLASTY, KNEE, TOTAL
Anesthesia: Spinal | Laterality: Right

## 2016-07-04 SURGERY — ARTHROPLASTY, HIP, TOTAL, ANTERIOR APPROACH
Anesthesia: Spinal | Site: Hip | Laterality: Right

## 2016-07-04 MED ORDER — ONDANSETRON HCL 4 MG PO TABS: 4.0000 mg | ORAL_TABLET | Freq: Four times a day (QID) | ORAL | Status: DC | PRN

## 2016-07-04 MED ORDER — LIDOCAINE HCL (CARDIAC) 20 MG/ML IV SOLN
INTRAVENOUS | Status: DC | PRN
Start: 2016-07-04 — End: 2016-07-04
  Administered 2016-07-04: 60 mg via INTRAVENOUS

## 2016-07-04 MED ORDER — PHENYLEPHRINE HCL 10 MG/ML IJ SOLN
INTRAVENOUS | Status: DC | PRN
  Administered 2016-07-04: 40 ug/min via INTRAVENOUS

## 2016-07-04 MED ORDER — BUPIVACAINE-EPINEPHRINE (PF) 0.5% -1:200000 IJ SOLN
INTRAMUSCULAR | Status: DC | PRN
  Administered 2016-07-04: 50 mL

## 2016-07-04 MED ORDER — MEPERIDINE HCL 25 MG/ML IJ SOLN: 6.2500 mg | INTRAMUSCULAR | Status: DC | PRN

## 2016-07-04 MED ORDER — CHLORHEXIDINE GLUCONATE 4 % EX LIQD: 60.0000 mL | Freq: Once | CUTANEOUS | Status: DC

## 2016-07-04 MED ORDER — PROPOFOL 10 MG/ML IV BOLUS
INTRAVENOUS | Status: AC
  Filled 2016-07-04: qty 20

## 2016-07-04 MED ORDER — BUPIVACAINE HCL (PF) 0.5 % IJ SOLN
INTRAMUSCULAR | Status: DC | PRN
  Administered 2016-07-04: 3 mL via INTRATHECAL

## 2016-07-04 MED ORDER — ACETAMINOPHEN 325 MG PO TABS: 650.0000 mg | ORAL_TABLET | Freq: Four times a day (QID) | ORAL | Status: DC | PRN

## 2016-07-04 MED ORDER — MIDAZOLAM HCL 5 MG/5ML IJ SOLN
INTRAMUSCULAR | Status: DC | PRN
  Administered 2016-07-04: 1 mg via INTRAVENOUS

## 2016-07-04 MED ORDER — ONDANSETRON HCL 4 MG/2ML IJ SOLN: 4.0000 mg | Freq: Four times a day (QID) | INTRAMUSCULAR | Status: DC | PRN

## 2016-07-04 MED ORDER — HYDROMORPHONE HCL 1 MG/ML IJ SOLN: 0.5000 mg | INTRAMUSCULAR | Status: DC | PRN

## 2016-07-04 MED ORDER — LORATADINE 10 MG PO TABS
10.0000 mg | ORAL_TABLET | Freq: Every day | ORAL | Status: DC
Start: 2016-07-05 — End: 2016-07-06
  Administered 2016-07-05 – 2016-07-06 (×2): 10 mg via ORAL
  Filled 2016-07-04 (×2): qty 1

## 2016-07-04 MED ORDER — FENTANYL CITRATE (PF) 250 MCG/5ML IJ SOLN
INTRAMUSCULAR | Status: AC
  Filled 2016-07-04: qty 5

## 2016-07-04 MED ORDER — PROPOFOL 500 MG/50ML IV EMUL
INTRAVENOUS | Status: DC | PRN
  Administered 2016-07-04: 80 ug/kg/min via INTRAVENOUS

## 2016-07-04 MED ORDER — OXYCODONE-ACETAMINOPHEN 5-325 MG PO TABS: 1.0000 | ORAL_TABLET | ORAL | 0 refills | Status: DC | PRN

## 2016-07-04 MED ORDER — FENTANYL CITRATE (PF) 100 MCG/2ML IJ SOLN: 25.0000 ug | INTRAMUSCULAR | Status: DC | PRN

## 2016-07-04 MED ORDER — PHENOL 1.4 % MT LIQD: 1.0000 | OROMUCOSAL | Status: DC | PRN

## 2016-07-04 MED ORDER — METOCLOPRAMIDE HCL 5 MG/ML IJ SOLN: 10.0000 mg | Freq: Once | INTRAMUSCULAR | Status: DC | PRN

## 2016-07-04 MED ORDER — MENTHOL 3 MG MT LOZG: 1.0000 | LOZENGE | OROMUCOSAL | Status: DC | PRN

## 2016-07-04 MED ORDER — HYDROCHLOROTHIAZIDE 25 MG PO TABS
25.0000 mg | ORAL_TABLET | Freq: Every day | ORAL | Status: DC
  Filled 2016-07-04 (×3): qty 1

## 2016-07-04 MED ORDER — METHOCARBAMOL 500 MG PO TABS: 500.0000 mg | ORAL_TABLET | Freq: Four times a day (QID) | ORAL | Status: DC | PRN

## 2016-07-04 MED ORDER — ALBUTEROL SULFATE (2.5 MG/3ML) 0.083% IN NEBU: 3.0000 mL | INHALATION_SOLUTION | Freq: Four times a day (QID) | RESPIRATORY_TRACT | Status: DC | PRN

## 2016-07-04 MED ORDER — FENTANYL CITRATE (PF) 100 MCG/2ML IJ SOLN
INTRAMUSCULAR | Status: DC | PRN
  Administered 2016-07-04: 25 ug via INTRAVENOUS
  Administered 2016-07-04: 50 ug via INTRAVENOUS

## 2016-07-04 MED ORDER — ALUM & MAG HYDROXIDE-SIMETH 200-200-20 MG/5ML PO SUSP: 30.0000 mL | ORAL | Status: DC | PRN

## 2016-07-04 MED ORDER — BUPIVACAINE LIPOSOME 1.3 % IJ SUSP
20.0000 mL | INTRAMUSCULAR | Status: AC
  Administered 2016-07-04: 20 mL
  Filled 2016-07-04: qty 20

## 2016-07-04 MED ORDER — PROPOFOL 500 MG/50ML IV EMUL
INTRAVENOUS | Status: AC
  Filled 2016-07-04: qty 100

## 2016-07-04 MED ORDER — SENNOSIDES-DOCUSATE SODIUM 8.6-50 MG PO TABS: 1.0000 | ORAL_TABLET | Freq: Every evening | ORAL | Status: DC | PRN

## 2016-07-04 MED ORDER — TRANEXAMIC ACID 1000 MG/10ML IV SOLN
1000.0000 mg | INTRAVENOUS | Status: AC
  Administered 2016-07-04: 1000 mg via INTRAVENOUS
  Filled 2016-07-04: qty 10

## 2016-07-04 MED ORDER — ASPIRIN EC 325 MG PO TBEC: 325.0000 mg | DELAYED_RELEASE_TABLET | Freq: Two times a day (BID) | ORAL | 0 refills | Status: DC

## 2016-07-04 MED ORDER — KCL IN DEXTROSE-NACL 20-5-0.45 MEQ/L-%-% IV SOLN
INTRAVENOUS | Status: DC
  Administered 2016-07-04: 18:00:00 via INTRAVENOUS
  Filled 2016-07-04 (×2): qty 1000

## 2016-07-04 MED ORDER — ASPIRIN EC 325 MG PO TBEC
325.0000 mg | DELAYED_RELEASE_TABLET | Freq: Every day | ORAL | Status: DC
  Administered 2016-07-05 – 2016-07-06 (×2): 325 mg via ORAL
  Filled 2016-07-04 (×2): qty 1

## 2016-07-04 MED ORDER — METHOCARBAMOL 1000 MG/10ML IJ SOLN
500.0000 mg | Freq: Four times a day (QID) | INTRAMUSCULAR | Status: DC | PRN
  Filled 2016-07-04: qty 5

## 2016-07-04 MED ORDER — DOCUSATE SODIUM 100 MG PO CAPS
100.0000 mg | ORAL_CAPSULE | Freq: Two times a day (BID) | ORAL | Status: DC
  Administered 2016-07-04 – 2016-07-06 (×4): 100 mg via ORAL
  Filled 2016-07-04 (×4): qty 1

## 2016-07-04 MED ORDER — 0.9 % SODIUM CHLORIDE (POUR BTL) OPTIME
TOPICAL | Status: DC | PRN
  Administered 2016-07-04: 1000 mL

## 2016-07-04 MED ORDER — FAMOTIDINE 20 MG PO TABS
20.0000 mg | ORAL_TABLET | Freq: Two times a day (BID) | ORAL | Status: DC
Start: 2016-07-04 — End: 2016-07-06
  Administered 2016-07-04 – 2016-07-06 (×4): 20 mg via ORAL
  Filled 2016-07-04 (×4): qty 1

## 2016-07-04 MED ORDER — VANCOMYCIN HCL IN DEXTROSE 1-5 GM/200ML-% IV SOLN
1000.0000 mg | INTRAVENOUS | Status: AC
  Administered 2016-07-04: 1000 mg via INTRAVENOUS
  Filled 2016-07-04: qty 200

## 2016-07-04 MED ORDER — ONDANSETRON HCL 4 MG/2ML IJ SOLN
INTRAMUSCULAR | Status: DC | PRN
  Administered 2016-07-04: 4 mg via INTRAVENOUS

## 2016-07-04 MED ORDER — BUPIVACAINE HCL (PF) 0.5 % IJ SOLN
INTRAMUSCULAR | Status: AC
  Filled 2016-07-04: qty 30

## 2016-07-04 MED ORDER — DIPHENHYDRAMINE HCL 12.5 MG/5ML PO ELIX: 12.5000 mg | ORAL_SOLUTION | ORAL | Status: DC | PRN

## 2016-07-04 MED ORDER — DEXAMETHASONE SODIUM PHOSPHATE 10 MG/ML IJ SOLN
10.0000 mg | Freq: Once | INTRAMUSCULAR | Status: AC
  Administered 2016-07-05: 10 mg via INTRAVENOUS
  Filled 2016-07-04: qty 1

## 2016-07-04 MED ORDER — ACETAMINOPHEN 650 MG RE SUPP: 650.0000 mg | Freq: Four times a day (QID) | RECTAL | Status: DC | PRN

## 2016-07-04 MED ORDER — METOCLOPRAMIDE HCL 5 MG PO TABS: 5.0000 mg | ORAL_TABLET | Freq: Three times a day (TID) | ORAL | Status: DC | PRN

## 2016-07-04 MED ORDER — FLEET ENEMA 7-19 GM/118ML RE ENEM: 1.0000 | ENEMA | Freq: Once | RECTAL | Status: DC | PRN

## 2016-07-04 MED ORDER — LACTATED RINGERS IV SOLN
INTRAVENOUS | Status: DC | PRN
  Administered 2016-07-04 (×2): via INTRAVENOUS

## 2016-07-04 MED ORDER — MIDAZOLAM HCL 2 MG/2ML IJ SOLN
INTRAMUSCULAR | Status: AC
  Filled 2016-07-04: qty 2

## 2016-07-04 MED ORDER — BISACODYL 5 MG PO TBEC: 5.0000 mg | DELAYED_RELEASE_TABLET | Freq: Every day | ORAL | Status: DC | PRN

## 2016-07-04 MED ORDER — OXYCODONE HCL 5 MG PO TABS
5.0000 mg | ORAL_TABLET | ORAL | Status: DC | PRN
  Administered 2016-07-04 – 2016-07-05 (×3): 5 mg via ORAL
  Filled 2016-07-04: qty 1
  Filled 2016-07-04: qty 2
  Filled 2016-07-04 (×2): qty 1

## 2016-07-04 MED ORDER — GABAPENTIN 300 MG PO CAPS
300.0000 mg | ORAL_CAPSULE | Freq: Three times a day (TID) | ORAL | Status: DC
  Administered 2016-07-04 – 2016-07-06 (×7): 300 mg via ORAL
  Filled 2016-07-04 (×7): qty 1

## 2016-07-04 MED ORDER — METOCLOPRAMIDE HCL 5 MG/ML IJ SOLN: 5.0000 mg | Freq: Three times a day (TID) | INTRAMUSCULAR | Status: DC | PRN

## 2016-07-04 MED ORDER — AMLODIPINE BESYLATE 5 MG PO TABS
5.0000 mg | ORAL_TABLET | Freq: Every day | ORAL | Status: DC
  Filled 2016-07-04 (×2): qty 1

## 2016-07-04 MED ORDER — TIZANIDINE HCL 2 MG PO TABS: 2.0000 mg | ORAL_TABLET | Freq: Four times a day (QID) | ORAL | 0 refills | Status: DC | PRN

## 2016-07-04 MED ORDER — PROPOFOL 10 MG/ML IV BOLUS
INTRAVENOUS | Status: DC | PRN
  Administered 2016-07-04: 20 mg via INTRAVENOUS
  Administered 2016-07-04: 30 mg via INTRAVENOUS

## 2016-07-04 SURGICAL SUPPLY — 42 items
BAG DECANTER FOR FLEXI CONT (MISCELLANEOUS) ×2 IMPLANT
BLADE SAW SGTL 18X1.27X75 (BLADE) ×2 IMPLANT
CAPT HIP TOTAL 2 ×2 IMPLANT
COVER PERINEAL POST (MISCELLANEOUS) ×2 IMPLANT
COVER SURGICAL LIGHT HANDLE (MISCELLANEOUS) ×2 IMPLANT
DRAPE C-ARM 42X72 X-RAY (DRAPES) ×2 IMPLANT
DRAPE STERI IOBAN 125X83 (DRAPES) ×2 IMPLANT
DRAPE U-SHAPE 47X51 STRL (DRAPES) ×4 IMPLANT
DRSG AQUACEL AG ADV 3.5X10 (GAUZE/BANDAGES/DRESSINGS) ×2 IMPLANT
DURAPREP 26ML APPLICATOR (WOUND CARE) ×2 IMPLANT
ELECT BLADE 4.0 EZ CLEAN MEGAD (MISCELLANEOUS) ×2
ELECT REM PT RETURN 9FT ADLT (ELECTROSURGICAL) ×2
ELECTRODE BLDE 4.0 EZ CLN MEGD (MISCELLANEOUS) ×1 IMPLANT
ELECTRODE REM PT RTRN 9FT ADLT (ELECTROSURGICAL) ×1 IMPLANT
FACESHIELD WRAPAROUND (MASK) ×4 IMPLANT
GLOVE BIO SURGEON STRL SZ7.5 (GLOVE) ×2 IMPLANT
GLOVE BIO SURGEON STRL SZ8.5 (GLOVE) ×2 IMPLANT
GLOVE BIOGEL PI IND STRL 8 (GLOVE) ×1 IMPLANT
GLOVE BIOGEL PI IND STRL 9 (GLOVE) ×1 IMPLANT
GLOVE BIOGEL PI INDICATOR 8 (GLOVE) ×1
GLOVE BIOGEL PI INDICATOR 9 (GLOVE) ×1
GOWN STRL REUS W/ TWL LRG LVL3 (GOWN DISPOSABLE) ×1 IMPLANT
GOWN STRL REUS W/ TWL XL LVL3 (GOWN DISPOSABLE) ×2 IMPLANT
GOWN STRL REUS W/TWL LRG LVL3 (GOWN DISPOSABLE) ×1
GOWN STRL REUS W/TWL XL LVL3 (GOWN DISPOSABLE) ×2
KIT BASIN OR (CUSTOM PROCEDURE TRAY) ×2 IMPLANT
KIT ROOM TURNOVER OR (KITS) ×2 IMPLANT
MANIFOLD NEPTUNE II (INSTRUMENTS) ×2 IMPLANT
NEEDLE HYPO 22GX1.5 SAFETY (NEEDLE) ×4 IMPLANT
NS IRRIG 1000ML POUR BTL (IV SOLUTION) ×2 IMPLANT
PACK TOTAL JOINT (CUSTOM PROCEDURE TRAY) ×2 IMPLANT
PAD ARMBOARD 7.5X6 YLW CONV (MISCELLANEOUS) ×4 IMPLANT
SUT VIC AB 1 CTX 36 (SUTURE) ×1
SUT VIC AB 1 CTX36XBRD ANBCTR (SUTURE) ×1 IMPLANT
SUT VIC AB 2-0 CT1 27 (SUTURE) ×1
SUT VIC AB 2-0 CT1 TAPERPNT 27 (SUTURE) ×1 IMPLANT
SUT VIC AB 3-0 PS2 18 (SUTURE) ×1
SUT VIC AB 3-0 PS2 18XBRD (SUTURE) ×1 IMPLANT
SYR CONTROL 10ML LL (SYRINGE) ×4 IMPLANT
TOWEL OR 17X24 6PK STRL BLUE (TOWEL DISPOSABLE) ×2 IMPLANT
TOWEL OR 17X26 10 PK STRL BLUE (TOWEL DISPOSABLE) ×2 IMPLANT
TRAY CATH 16FR W/PLASTIC CATH (SET/KITS/TRAYS/PACK) ×2 IMPLANT

## 2016-07-04 NOTE — Anesthesia Preprocedure Evaluation (Signed)
Anesthesia Evaluation  Patient identified by MRN, date of birth, ID band Patient awake    Reviewed: Allergy & Precautions, NPO status , Patient's Chart, lab work & pertinent test results  Airway Mallampati: II  TM Distance: >3 FB Neck ROM: Full    Dental no notable dental hx. (+) Upper Dentures   Pulmonary asthma ,    Pulmonary exam normal breath sounds clear to auscultation       Cardiovascular hypertension, Pt. on medications Normal cardiovascular exam Rhythm:Regular Rate:Normal     Neuro/Psych negative neurological ROS  negative psych ROS   GI/Hepatic negative GI ROS, Neg liver ROS,   Endo/Other  negative endocrine ROS  Renal/GU negative Renal ROS  negative genitourinary   Musculoskeletal negative musculoskeletal ROS (+)   Abdominal   Peds negative pediatric ROS (+)  Hematology negative hematology ROS (+)   Anesthesia Other Findings   Reproductive/Obstetrics negative OB ROS                            Anesthesia Physical Anesthesia Plan  ASA: II  Anesthesia Plan: Spinal   Post-op Pain Management:    Induction:   Airway Management Planned: Simple Face Mask  Additional Equipment:   Intra-op Plan:   Post-operative Plan:   Informed Consent: I have reviewed the patients History and Physical, chart, labs and discussed the procedure including the risks, benefits and alternatives for the proposed anesthesia with the patient or authorized representative who has indicated his/her understanding and acceptance.   Dental advisory given  Plan Discussed with: CRNA  Anesthesia Plan Comments:         Anesthesia Quick Evaluation

## 2016-07-04 NOTE — Discharge Instructions (Signed)

## 2016-07-04 NOTE — Interval H&P Note (Signed)
History and Physical Interval Note:  07/04/2016 8:20 AM  Bailey Wilson  has presented today for surgery, with the diagnosis of RIGHT HIP OSTEOARTHRITIS  The various methods of treatment have been discussed with the patient and family. After consideration of risks, benefits and other options for treatment, the patient has consented to  Procedure(s): TOTAL HIP ARTHROPLASTY ANTERIOR APPROACH (Right) as a surgical intervention .  The patient's history has been reviewed, patient examined, no change in status, stable for surgery.  I have reviewed the patient's chart and labs.  Questions were answered to the patient's satisfaction.     [name]

## 2016-07-04 NOTE — Anesthesia Procedure Notes (Signed)
Spinal  Patient location during procedure: OR Staffing Anesthesiologist: Montez Hageman Performed: anesthesiologist  Preanesthetic Checklist Completed: patient identified, site marked, surgical consent, pre-op evaluation, timeout performed, IV checked, risks and benefits discussed and monitors and equipment checked Spinal Block Patient position: sitting Prep: Betadine Patient monitoring: heart rate, continuous pulse ox and blood pressure Approach: right paramedian Location: L2-3 Injection technique: single-shot Needle Needle type: Sprotte  Needle gauge: 24 G Needle length: 9 cm Additional Notes Expiration date of kit checked and confirmed. Patient tolerated procedure well, without complications.

## 2016-07-04 NOTE — Anesthesia Procedure Notes (Signed)
Date/Time: 07/04/2016 9:10 AM Performed by: Izora Gala Pre-anesthesia Checklist: Patient identified, Emergency Drugs available and Suction available Patient Re-evaluated:Patient Re-evaluated prior to inductionOxygen Delivery Method: Simple face mask Preoxygenation: Pre-oxygenation with 100% oxygen Intubation Type: IV induction Placement Confirmation: positive ETCO2

## 2016-07-04 NOTE — Op Note (Signed)
OPERATIVE REPORT    DATE OF PROCEDURE:  07/04/2016       PREOPERATIVE DIAGNOSIS:  RIGHT HIP OSTEOARTHRITIS                                                          POSTOPERATIVE DIAGNOSIS:  RIGHT HIP OSTEOARTHRITIS                                                           PROCEDURE: Anterior R total hip arthroplasty using a 50 mm DePuy Gryption  Cup, [employer], 0-degree polyethylene liner, a +1 32 mm ceramic head, a 4 Depuy Triloc stem   SURGEON: Katherine Tout J    ASSISTANT:   Eric K[employer]  (present throughout entire procedure and necessary for timely completion of the procedure)   ANESTHESIA: Spinal BLOOD LOSS: 350 FLUID REPLACEMENT: 1600 crystalloid Antibiotic: 1gm vancomycin Tranexamic Acid: 1gm iv, 2 gm topical COMPLICATIONS: none    INDICATIONS FOR PROCEDURE: A 76 y.o. year-old With  RIGHT HIP OSTEOARTHRITIS   for 2 years, x-rays show bone-on-bone arthritic changes, and osteophytes. Despite conservative measures with observation, anti-inflammatory medicine, narcotics, use of a cane, has severe unremitting pain and can ambulate only a few blocks before resting. Patient desires elective R total hip arthroplasty to decrease pain and increase function. The risks, benefits, and alternatives were discussed at length including but not limited to the risks of infection, bleeding, nerve injury, stiffness, blood clots, the need for revision surgery, cardiopulmonary complications, among others, and they were willing to proceed. Questions answered     PROCEDURE IN DETAIL: The patient was identified by armband,  received preoperative IV antibiotics in the holding area at [facility], taken to the operating room , appropriate anesthetic monitors  were attached and  anesthesia was induced with the patienton the gurney. The HANA boots were applied to the feet and he was then transferred to the HANA table with a peroneal post and support underneath the non-operative  le, which was locked in 5 lb traction. Theoperative lower extremity was then prepped and draped in the usual sterile fashion from just above the iliac crest to the knee. And a timeout procedure was performed. We then made a 12 cm incision along the interval at the leading edge of the tensor fascia lata of starting at 2 cm lateral to and 2 cm distal to the ASIS. Small bleeders in the skin and subcutaneous tissue identified and cauterized we dissected down to the fascia and made an incision in the fascia allowing [place] to elevate the fascia of the tensor muscle and exploited the interval between the rectus and the tensor fascia lata. A Hohmann retractor was then placed along the superior neck of the femur and a Cobra retractor along the inferior neck of the femur we teed the capsule starting out at the superior anterior aspect of the acetabulum going distally and made the T along the neck both leaflets of the T were tagged with #2 Ethibond suture. Cobra retractors were then placed along the inferior and superior neck allowing [place] to perform a standard neck cut and  removed the femoral head with a power corkscrew. We then placed a right angle Hohmann retractor along the anterior aspect of the acetabulum a spiked Cobra in the cotyloid notch and posteriorly a Muelller retractor. We then sequentially reamed up to a 49 mm basket reamer obtaining good coverage in all quadrants, verified by C-arm imaging. Under C-arm control with and hammered into place a 50 mm Gryption cup in 45 of abduction and 15 of anteversion. The cup seated nicely and required no supplemental screws. We then placed a central hole Eliminator and a 0 polyethylene liner. The foot was then externally rotated to 110, the HANA elevator was placed around the flare of the greater trochanter and the limb was extended and abducted delivering the proximal femur up into the wound. A medium Hohmann retractor was placed over the greater trochanter and a Mueller  retractor along the posterior femoral neck completing the exposure. We then performed releases superiorly and and inferiorly of the capsule going back to the pirformis fossa superiorly and to the lesser trochanter inferiorly. We then entered the proximal femur with the box cutting offset chisel followed by, a canal sounder, the chili pepper and broaching up to a 4 broach. This seated nicely and we reamed the calcar. A trial reduction was performed with a 1.5 mm 32 mm head.The limb lengths were excellent the hip was stable in 90 of external rotation. At this point the trial components removed and we hammered into place a # 4 Tri-Lock stem with Gryption coating. This was a Std offset stem and a + 1 32 mm ceramic ball was then hammered into place the hip was reduced and final C-arm images obtained. The wound was thoroughly irrigated with normal saline solution. We repaired the ant capsule and the tensor fascia lot a with running 0 vicryl suture. the subcutaneous tissue was closed with 2-0 and 3-0 Vicryl suture followed by an Aquacil dressing. At this point the patient was awaken and transferred to hospital gurney without difficulty. The subcutaneous tissue with 0 and 2-0 undyed Vicryl suture and the skin with running  3-0 vicryl subcuticular suture. Aquacil dressing was applied. The patient was then unclamped, rolled supine, awaken extubated and taken to recovery room without difficulty in stable condition.   Bailey Wilson J 07/04/2016, 10:12 AM

## 2016-07-04 NOTE — Evaluation (Signed)
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 PMH includes carotid stenosis, HTN, and heart murmur.   Clinical Impression  Pt s/p elective surgery above with deficits below. PTA, pt was ambulating with RW secondary to pain in R hip. Upon evaluation, pt presenting with post op pain and weakness, along with decreased balance which limited activity tolerance. Pt requiring from min to mod A for functional mobility tasks. Per pt, will be going to SNF at d/c to increase independence with functional mobility. Will continue to follow to progress mobility.     Follow Up Recommendations SNF    Equipment Recommendations  None recommended by PT    Recommendations for Other Services       Precautions / Restrictions Precautions Precautions: Posterior Hip Precaution Booklet Issued: Yes (comment) Precaution Comments: Administered precaution handout and THA exercise handout.  Restrictions Weight Bearing Restrictions: Yes RLE Weight Bearing: Weight bearing as tolerated      Mobility  Bed Mobility Overal bed mobility: Needs Assistance Bed Mobility: Supine to Sit     Supine to sit: Min assist     General bed mobility comments: Min A for RLE management. Verbal cues for maintenance of posterior precautions. Use of handrails.   Transfers Overall transfer level: Needs assistance Equipment used: Rolling walker (2 wheeled) Transfers: Sit to/from Stand Sit to Stand: Min assist;Mod assist         General transfer comment: Min A for lift assist and steadying upon standing. Pt initially with posterior lean in standing. Mod A for descent into chair. Verbal cues to reach back for chair and avoid "plopping".   Ambulation/Gait Ambulation/Gait assistance: Min assist Ambulation Distance (Feet): 5 Feet Assistive device: Rolling  walker (2 wheeled) Gait Pattern/deviations: Step-to pattern;Decreased step length - right;Decreased weight shift to right;Antalgic;Trunk flexed Gait velocity: Decreased Gait velocity interpretation: Below normal speed for age/gender General Gait Details: Slow, antalgic gait. Unsteady, requiring min A for steadying. LOB X 1 requiring min A for steadying.   Stairs            Wheelchair Mobility    Modified Rankin (Stroke Patients Only)       Balance Overall balance assessment: Needs assistance Sitting-balance support: No upper extremity supported;Feet supported Sitting balance-Leahy Scale: Good     Standing balance support: Bilateral upper extremity supported;During functional activity Standing balance-Leahy Scale: Poor Standing balance comment: Reliant on RW for stability                              Pertinent Vitals/Pain Pain Assessment: 0-10 Pain Score: 4  Pain Location: R hip  Pain Descriptors / Indicators: Aching;Operative site guarding;Sore Pain Intervention(s): Limited activity within patient's tolerance;Monitored during session;Repositioned    Home Living Family/patient expects to be discharged to:: Skilled nursing facility ("I want to go to the [facility]") Living Arrangements: Alone               Additional Comments: [place] for SNF    Prior Function Level of Independence: Independent with assistive device(s)         Comments: Used RW for ambulation secondary to pain     Hand Dominance   Dominant Hand: Right    Extremity/Trunk Assessment   Upper Extremity Assessment Upper Extremity Assessment: Overall WFL for tasks  assessed    Lower Extremity Assessment Lower Extremity Assessment: RLE deficits/detail RLE Deficits / Details: Sensory in tact. Deficits consistent with post op pain and weakness. Able to perform exercises below.     Cervical / Trunk Assessment Cervical / Trunk Assessment: Kyphotic  Communication    Communication: No difficulties  Cognition Arousal/Alertness: Awake/alert Behavior During Therapy: WFL for tasks assessed/performed Overall Cognitive Status: Within Functional Limits for tasks assessed                                        General Comments General comments (skin integrity, edema, etc.): Pt's daughter present at end of session     Exercises Total Joint Exercises Ankle Circles/Pumps: AROM;Both;10 reps;Supine Quad Sets: AROM;Right;10 reps;Supine Short Arc Quad: AROM;Right;10 reps;Supine Heel Slides: AROM;Right;10 reps;Supine Hip ABduction/ADduction: AROM;Right;10 reps;Supine   Assessment/Plan    PT Assessment Patient needs continued PT services  PT Problem List Decreased strength;Decreased range of motion;Decreased activity tolerance;Decreased balance;Decreased mobility;Decreased knowledge of use of DME;Decreased knowledge of precautions;Pain       PT Treatment Interventions DME instruction;Gait training;Functional mobility training;Therapeutic activities;Therapeutic exercise;Balance training;Neuromuscular re-education;Patient/family education    PT Goals (Current goals can be found in the Care Plan section)  Acute Rehab PT Goals Patient Stated Goal: to increase independence PT Goal Formulation: With patient Time For Goal Achievement: 07/11/16 Potential to Achieve Goals: Good    Frequency 7X/week   Barriers to discharge Decreased caregiver support Lives alone; plans to go to SNF at d/c    Co-evaluation               AM-PAC PT "6 Clicks" Daily Activity  Outcome Measure Difficulty turning over in bed (including adjusting bedclothes, sheets and blankets)?: A Lot Difficulty moving from lying on back to sitting on the side of the bed? : Total Difficulty sitting down on and standing up from a chair with arms (e.g., wheelchair, bedside commode, etc,.)?: Total Help needed moving to and from a bed to chair (including a wheelchair)?: A  Little Help needed walking in hospital room?: A Little Help needed climbing 3-5 steps with a railing? : A Lot 6 Click Score: 12    End of Session Equipment Utilized During Treatment: Gait belt Activity Tolerance: Patient limited by pain Patient left: in chair;with call bell/phone within reach;with family/visitor present Nurse Communication: Mobility status PT Visit Diagnosis: Other abnormalities of gait and mobility (R26.89);Pain Pain - Right/Left: Right Pain - part of body: Hip    Time: 0712-1975 PT Time Calculation (min) (ACUTE ONLY): 24 min   Charges:   PT Evaluation $PT Eval Low Complexity: 1 Procedure PT Treatments $Gait Training: 8-22 mins   PT G Codes:        Nicky Pugh, PT, DPT  Acute Rehabilitation Services  Pager: [phone]   Army Melia 07/04/2016, 7:23 PM

## 2016-07-04 NOTE — Transfer of Care (Signed)
Immediate Anesthesia Transfer of Care Note  Patient: Bailey Wilson  Procedure(s) Performed: Procedure(s): TOTAL HIP ARTHROPLASTY ANTERIOR APPROACH (Right)  Patient Location: PACU  Anesthesia Type:Spinal  Level of Consciousness: awake, alert , sedated and patient cooperative  Airway & Oxygen Therapy: Patient Spontanous Breathing and Patient connected to nasal cannula oxygen  Post-op Assessment: Report given to RN, Post -op Vital signs reviewed and stable and Patient moving all extremities  Post vital signs: Reviewed and stable  Last Vitals:  Vitals:   07/04/16 0819 07/04/16 1041  BP: (!) 159/68   Pulse: 75 (P) 77  Resp: 18 (P) 14  Temp: 36.4 C (P) 36.3 C    Last Pain:  Vitals:   07/04/16 0819  TempSrc: Oral  PainSc:       Patients Stated Pain Goal: 1 (23/00/97 9499)  Complications: No apparent anesthesia complications

## 2016-07-05 ENCOUNTER — Encounter (HOSPITAL_COMMUNITY): Payer: Self-pay | Admitting: Orthopedic Surgery

## 2016-07-05 LAB — CBC
HCT: 28.5 % — ABNORMAL LOW (ref 36.0–46.0)
Hemoglobin: 9.1 g/dL — ABNORMAL LOW (ref 12.0–15.0)
MCH: 26.8 pg (ref 26.0–34.0)
MCHC: 31.9 g/dL (ref 30.0–36.0)
MCV: 84.1 fL (ref 78.0–100.0)
Platelets: 247 10*3/uL (ref 150–400)
RBC: 3.39 MIL/uL — ABNORMAL LOW (ref 3.87–5.11)
RDW: 13.9 % (ref 11.5–15.5)
WBC: 8 10*3/uL (ref 4.0–10.5)

## 2016-07-05 MED ORDER — KCL IN DEXTROSE-NACL 20-5-0.45 MEQ/L-%-% IV SOLN
INTRAVENOUS | Status: DC
  Administered 2016-07-05: 07:00:00 via INTRAVENOUS
  Administered 2016-07-05: 1000 mL via INTRAVENOUS
  Filled 2016-07-05: qty 1000

## 2016-07-05 NOTE — Progress Notes (Signed)
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 PMH includes carotid stenosis, HTN, and heart murmur.     PT Comments    Pt performed increased gait and PTA reviewed anterior precautions with patient and her daughter.  Pt performed exercises in compliance with anterior THA precautions.  Plan next session to progress mobility.  Plan for short term SNF placement remains appropriate.     Follow Up Recommendations  SNF     Equipment Recommendations  None recommended by PT    Recommendations for Other Services       Precautions / Restrictions Precautions Precautions: Anterior Hip Precaution Booklet Issued: Yes (comment) Precaution Comments: Administered precaution handout and THA exercise handout. Clarified about change in precautions per orders.  There was some confusion yesterday.   Restrictions Weight Bearing Restrictions: Yes RLE Weight Bearing: Weight bearing as tolerated    Mobility  Bed Mobility               General bed mobility comments: Pt sitting in recliner on arrival.    Transfers Overall transfer level: Needs assistance Equipment used: Rolling walker (2 wheeled) Transfers: Sit to/from Stand Sit to Stand: Min assist         General transfer comment: Pt remains to require min assist to boost into standing.  Pt required cues for upper trunk control, hip extension.    Ambulation/Gait Ambulation/Gait assistance: Min assist Ambulation Distance (Feet): 50 Feet Assistive device: Rolling walker (2 wheeled) Gait Pattern/deviations: Step-to pattern;Trunk flexed;Narrow base of support;Decreased stride length;Shuffle Gait velocity: Decreased Gait velocity interpretation: Below normal speed for age/gender General Gait Details: Pt required constant cueing for upper trunk control and for progressing  steps forward.  Max VCs to maintain safety.  Pt with LOB x3 during tx.     Stairs            Wheelchair Mobility    Modified Rankin (Stroke Patients Only)       Balance Overall balance assessment: Needs assistance Sitting-balance support: No upper extremity supported;Feet supported Sitting balance-Leahy Scale: Good     Standing balance support: Bilateral upper extremity supported;During functional activity Standing balance-Leahy Scale: Poor Standing balance comment: Reliant on RW for stability                             Cognition Arousal/Alertness: Awake/alert Behavior During Therapy: WFL for tasks assessed/performed Overall Cognitive Status: Within Functional Limits for tasks assessed                                        Exercises Total Joint Exercises Ankle Circles/Pumps: AROM;Both;Supine;20 reps Quad Sets: AROM;Right;10 reps;Supine Short Arc Quad: AROM;Right;10 reps;Supine Heel Slides: AROM;Right;10 reps;Supine Long Arc Quad: AROM;Right;10 reps;Seated Knee Flexion: AROM;Right;10 reps;Standing Marching in Standing: AROM;Right;10 reps;Standing    General Comments        Pertinent Vitals/Pain Pain Assessment: 0-10 Pain Score: 4  Pain Location: R hip  Pain Descriptors / Indicators: Aching;Operative site guarding;Sore Pain Intervention(s): Monitored during session;Repositioned    Home Living                      Prior Function  PT Goals (current goals can now be found in the care plan section) Acute Rehab PT Goals Patient Stated Goal: to increase independence Potential to Achieve Goals: Good Progress towards PT goals: Progressing toward goals    Frequency    7X/week      PT Plan Current plan remains appropriate    Co-evaluation              AM-PAC PT "6 Clicks" Daily Activity  Outcome Measure  Difficulty turning over in bed (including adjusting bedclothes, sheets and blankets)?: A  Lot Difficulty moving from lying on back to sitting on the side of the bed? : A Lot Difficulty sitting down on and standing up from a chair with arms (e.g., wheelchair, bedside commode, etc,.)?: A Lot Help needed moving to and from a bed to chair (including a wheelchair)?: A Little Help needed walking in hospital room?: A Little Help needed climbing 3-5 steps with a railing? : A Lot 6 Click Score: 14    End of Session Equipment Utilized During Treatment: Gait belt Activity Tolerance: Patient limited by pain Patient left: in chair;with call bell/phone within reach;with family/visitor present Nurse Communication: Mobility status PT Visit Diagnosis: Other abnormalities of gait and mobility (R26.89);Pain Pain - Right/Left: Right Pain - part of body: Hip     Time: 1638-4536 PT Time Calculation (min) (ACUTE ONLY): 38 min  Charges:  $Gait Training: 8-22 mins $Therapeutic Exercise: 8-22 mins $Therapeutic Activity: 8-22 mins                    G Codes:       Governor Rooks, PTA pager [phone]    Cristela Blue 07/05/2016, 2:25 PM

## 2016-07-05 NOTE — Progress Notes (Signed)
Patient ID: Bailey Wilson, female   DOB: 1940/11/07, 76 y.o.   MRN: [id number] PATIENT ID: Bailey Wilson  MRN: [id number]  DOB/AGE:  1940-07-27 / 76 y.o.  1 Day Post-Op Procedure(s) (LRB): TOTAL HIP ARTHROPLASTY ANTERIOR APPROACH (Right)    PROGRESS NOTE Subjective: Patient is alert, oriented, No Nausea, No Vomiting, yes passing gas, . Taking PO Well. Denies SOB, Chest or Calf Pain. Using Incentive Spirometer, PAS in place. Ambulate Weightbearing as tolerated today with physical therapy Patient reports pain as  2/10 , Daughter reports she had her best night of sleep and ages.  Patient was orthostatic with a blood pressure of 60/40.  When I saw her this morning.  She was given a 300 cc bolus and her blood pressure came up to 107 over 44.    Objective: Vital signs in last 24 hours: Vitals:   07/04/16 2044 07/05/16 0041 07/05/16 0640 07/05/16 0707  BP: 131/61 124/65 (!) 63/40 (!) 107/44  Pulse: 87 (!) 106 94   Resp: 18 18 14    Temp: 97.9 F (36.6 C) (!) 100.8 F (38.2 C) 98.6 F (37 C)   TempSrc: Oral Oral Oral   SpO2: 98% 95% 95%   Weight:      Height:          Intake/Output from previous day: I/O last 3 completed shifts: In: 7412 [P.O.:680; I.V.:1000] Out: 2100 [Urine:2000; Blood:100]   Intake/Output this shift: No intake/output data recorded.   LABORATORY DATA:  Recent Labs  07/04/16 0754  INR 1.06    Examination: Neurologically intact ABD soft Neurovascular intact Sensation intact distally Intact pulses distally Dorsiflexion/Plantar flexion intact Incision: dressing C/D/I No cellulitis present Compartment soft} XR AP&Lat of hip shows well placed\fixed THA  Assessment:   1 Day Post-Op Procedure(s) (LRB): TOTAL HIP ARTHROPLASTY ANTERIOR APPROACH (Right) ADDITIONAL DIAGNOSIS:  Expected Acute Blood Loss Anemia, Hypertension, Carotid stenosis  Plan: PT/OT WBAT, THA  DVT Prophylaxis: SCDx72 hrs, ASA 325 mg BID x 2 weeks  DISCHARGE PLAN: Skilled Nursing  Facility/Rehab, Probably tomorrow  DISCHARGE NEEDS: HHPT, Walker and 3-in-1 comode seat

## 2016-07-05 NOTE — Progress Notes (Signed)
Rn notified Dr Mayer Camel of patient's BP of 100/45 this AM and that patient had amlodipine and HCTZ scheduled for this AM. Dr Mayer Camel gave RN verbal orders to hold amlodipine and HCTZ this AM.

## 2016-07-05 NOTE — Clinical Social Work Note (Signed)
Clinical Social Work Assessment  Patient Details  Name: Bailey Wilson MRN: [id number] Date of Birth: [birth year]  Date of referral:  07/05/16               Reason for consult:  Facility Placement                Permission sought to share information with:    Permission granted to share information::  Yes, Verbal Permission Granted  Name::     Advertising account planner::  SNF  Relationship::  daughter  Contact Information:     Housing/Transportation Living arrangements for the past 2 months:  Single Family Home Source of Information:  Patient, Adult Children Patient Interpreter Needed:  None Criminal Activity/Legal Involvement Pertinent to Current Situation/Hospitalization:  No - Comment as needed Significant Relationships:  Adult Children Lives with:  Self Do you feel safe going back to the place where you live?  No Need for family participation in patient care:  Yes (Comment)  Care giving concerns:  Patient independent prior to hospitalization. She resides alone and is not safe to return home at this time.  Social Worker assessment / plan:  CSW met with patient and daughter [name] at bedside to discuss clinical team recommendations for DC to SNF.  CSW explained role and SNF options/facility placement. Patient and family indicated that they have experience with SNF and would like to go to [facility].  CSW obtained permission to send offers to [facility] and [place] area.   FL2 and Passr complete. Offers sent.  Employment status:  Retired Nurse, adult PT Recommendations:  [place] / Referral to community resources:     Patient/Family's Response to care:  Patient and daughter appreciative of assistance provided by CSW. No issues and concerns identified at this time.  Patient/Family's Understanding of and Emotional Response to Diagnosis, Current Treatment, and Prognosis:  Patient and daughter has good understanding of diagnosis, current  treatment, and prognosis. They are hopeful that rehabilitation will address the physical impairment. No issues or concerns identified at this time.  Emotional Assessment Appearance:  Appears stated age Attitude/Demeanor/Rapport:   (Cooperative) Affect (typically observed):  Accepting, Appropriate Orientation:  Oriented to Self, Oriented to Place, Oriented to  Time, Oriented to Situation Alcohol / Substance use:  Not Applicable Psych involvement (Current and /or in the community):  No (Comment)  Discharge Needs  Concerns to be addressed:  Care Coordination Readmission within the last 30 days:  No Current discharge risk:  Physical Impairment, Dependent with Mobility Barriers to Discharge:  No Barriers Identified   Normajean Baxter, LCSW 07/05/2016, 11:34 AM

## 2016-07-05 NOTE — Progress Notes (Signed)
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 PMH includes carotid stenosis, HTN, and heart murmur.     PT Comments    Making improvements with gait.  Agree with SNF at d/c.  Follow Up Recommendations  SNF     Equipment Recommendations  None recommended by PT    Recommendations for Other Services       Precautions / Restrictions Precautions Precautions: Anterior Hip Precaution Comments: Reviewed precautions with patient. Restrictions Weight Bearing Restrictions: Yes RLE Weight Bearing: Weight bearing as tolerated    Mobility  Bed Mobility               General bed mobility comments: Patient in chair as PT entered room  Transfers Overall transfer level: Needs assistance Equipment used: Rolling walker (2 wheeled) Transfers: Sit to/from Stand Sit to Stand: Min assist         General transfer comment: Patient uses correct hand placement.  Min assist to steady as patient rises to standing.  Ambulation/Gait Ambulation/Gait assistance: Min guard Ambulation Distance (Feet): 78 Feet Assistive device: Rolling walker (2 wheeled) Gait Pattern/deviations: Step-to pattern;Decreased stride length;Decreased stance time - right;Decreased step length - left;Decreased weight shift to right;Shuffle;Trunk flexed;Antalgic Gait velocity: Decreased Gait velocity interpretation: Below normal speed for age/gender General Gait Details: Verbal cues to stand upright during gait.  Patient staying closer to RW this pm (She - PTA - told me to do that today.) .     Stairs            Wheelchair Mobility    Modified Rankin (Stroke Patients Only)       Balance Overall balance assessment: Needs assistance Sitting-balance support: No upper extremity supported;Feet supported Sitting balance-Leahy Scale: Good      Standing balance support: Bilateral upper extremity supported;During functional activity Standing balance-Leahy Scale: Poor Standing balance comment: Reliant on RW for stability                             Cognition Arousal/Alertness: Awake/alert Behavior During Therapy: WFL for tasks assessed/performed Overall Cognitive Status: Within Functional Limits for tasks assessed                                        Exercises Total Joint Exercises Ankle Circles/Pumps: AROM;Both;10 reps;Seated Quad Sets: AROM;Right;10 reps;Seated Short Arc Quad: AROM;Right;10 reps;Supine Heel Slides: AROM;Right;10 reps;Supine Long Arc Quad: AROM;Right;10 reps;Seated Knee Flexion: AROM;Right;10 reps;Standing Marching in Standing: AROM;Right;10 reps;Standing    General Comments        Pertinent Vitals/Pain Pain Assessment: 0-10 Pain Score: 3  Pain Location: R hip  Pain Descriptors / Indicators: Sore Pain Intervention(s): Monitored during session;Repositioned;Ice applied    Home Living                      Prior Function            PT Goals (current goals can now be found in the care plan section) Acute Rehab PT Goals Patient Stated Goal: to increase independence Potential to Achieve Goals: Good Progress towards PT goals: Progressing toward goals    Frequency    7X/week      PT Plan Current plan  remains appropriate    Co-evaluation              AM-PAC PT "6 Clicks" Daily Activity  Outcome Measure  Difficulty turning over in bed (including adjusting bedclothes, sheets and blankets)?: A Lot Difficulty moving from lying on back to sitting on the side of the bed? : A Lot Difficulty sitting down on and standing up from a chair with arms (e.g., wheelchair, bedside commode, etc,.)?: A Lot Help needed moving to and from a bed to chair (including a wheelchair)?: A Little Help needed walking in hospital room?: A Little Help needed climbing  3-5 steps with a railing? : A Lot 6 Click Score: 14    End of Session Equipment Utilized During Treatment: Gait belt Activity Tolerance: Patient tolerated treatment well;Patient limited by pain Patient left: in chair;with call bell/phone within reach;with family/visitor present Nurse Communication: Mobility status PT Visit Diagnosis: Other abnormalities of gait and mobility (R26.89);Pain Pain - Right/Left: Right Pain - part of body: Hip     Time: 1741-1756 PT Time Calculation (min) (ACUTE ONLY): 15 min  Charges:  $Gait Training: 8-22 mins $Therapeutic Exercise: 8-22 mins $Therapeutic Activity: 8-22 mins                    G Codes:       Carita Pian. Sanjuana Kava, [facility] Acute Rehab Services Pager [place] 07/05/2016, 6:10 PM

## 2016-07-05 NOTE — Anesthesia Postprocedure Evaluation (Signed)
Anesthesia Post Note  Patient: Bailey Wilson  Procedure(s) Performed: Procedure(s) (LRB): TOTAL HIP ARTHROPLASTY ANTERIOR APPROACH (Right)  Patient location during evaluation: PACU Anesthesia Type: Spinal Level of consciousness: awake and alert Pain management: pain level controlled Vital Signs Assessment: post-procedure vital signs reviewed and stable Respiratory status: spontaneous breathing and respiratory function stable Cardiovascular status: blood pressure returned to baseline and stable Postop Assessment: no headache, no backache and spinal receding Anesthetic complications: no       Last Vitals:  Vitals:   07/04/16 2044 07/05/16 0041  BP: 131/61 124/65  Pulse: 87 (!) 106  Resp: 18 18  Temp: 36.6 C (!) 38.2 C    Last Pain:  Vitals:   07/05/16 0137  TempSrc:   PainSc: Asleep                 Montez Hageman

## 2016-07-05 NOTE — NC FL2 (Signed)
[place] MEDICAID FL2 LEVEL OF CARE SCREENING TOOL     IDENTIFICATION  Patient Name: Bailey Wilson Birthdate: 12/28/1940 Sex: female Admission Date (Current Location): 07/04/2016  [facility] and [place] Number:  Herbalist and Address:  [facility] [place]. [facility], [place] [address], [place] at [place], [place] [address]      Provider Number: [id number]  Attending Physician Name and Address:  Frederik Pear, MD  Relative Name and Phone Number:       Current Level of Care: Hospital Recommended Level of Care: [place] Prior Approval Number:    Date Approved/Denied: 07/05/16 PASRR Number: [id number]  Discharge Plan: SNF    Current Diagnoses: Patient Active Problem List   Diagnosis Date Noted  . Arthritis of right hip 07/04/2016  . Carotid stenosis 06/03/2016  . Murmur 05/06/2016  . Essential hypertension 04/20/2016  . Environmental and seasonal allergies   . Primary localized osteoarthritis of right knee     Orientation RESPIRATION BLADDER Height & Weight     Self, Time, Situation, Place  Normal Continent Weight: 167 lb 5 oz (75.9 kg) Height:  5\' 5"  (165.1 cm)  BEHAVIORAL SYMPTOMS/MOOD NEUROLOGICAL BOWEL NUTRITION STATUS      Continent Diet (See DC Summary)  AMBULATORY STATUS COMMUNICATION OF NEEDS Skin   Limited Assist Verbally Surgical wounds (Right Hip incision with Hydrocolloid dressing)                       Personal Care Assistance Level of Assistance  Bathing, Feeding, Dressing Bathing Assistance: Limited assistance Feeding assistance: Independent Dressing Assistance: Limited assistance     Functional Limitations Info  Sight, Hearing, Speech Sight Info: Adequate Hearing Info: Adequate Speech Info: Adequate    SPECIAL CARE FACTORS FREQUENCY  PT (By licensed PT), OT (By licensed OT)     PT Frequency: 5xweek OT Frequency: 5xweek            Contractures      Additional Factors Info  Code Status, Allergies Code  Status Info: Full Allergies Info: PENICILLINS, ACE INHIBITORS, ASPIRIN, SULFA ANTIBIOTICS            Current Medications (07/05/2016):  This is the current hospital active medication list Current Facility-Administered Medications  Medication Dose Route Frequency Provider Last Rate Last Dose  . acetaminophen (TYLENOL) tablet 650 mg  650 mg Oral Q6H PRN Leighton Parody, PA-C       Or  . acetaminophen (TYLENOL) suppository 650 mg  650 mg Rectal Q6H PRN Leighton Parody, PA-C      . albuterol (PROVENTIL) (2.5 MG/3ML) 0.083% nebulizer solution 3 mL  3 mL Inhalation Q6H PRN Leighton Parody, PA-C      . alum & mag hydroxide-simeth (MAALOX/MYLANTA) 200-200-20 MG/5ML suspension 30 mL  30 mL Oral Q4H PRN Leighton Parody, PA-C      . amLODipine (NORVASC) tablet 5 mg  5 mg Oral Daily Joanell Rising K, PA-C      . aspirin EC tablet 325 mg  325 mg Oral Q breakfast Leighton Parody, PA-C   325 mg at 07/05/16 0840  . bisacodyl (DULCOLAX) EC tablet 5 mg  5 mg Oral Daily PRN Joanell Rising K, PA-C      . dextrose 5 % and 0.45 % NaCl with KCl 20 mEq/L infusion   Intravenous Continuous Frederik Pear, MD 125 mL/hr at 07/05/16 0919 1,000 mL at 07/05/16 0919  . diphenhydrAMINE (BENADRYL) 12.5 MG/5ML elixir 12.5-25 mg  12.5-25  mg Oral Q4H PRN Leighton Parody, PA-C      . docusate sodium (COLACE) capsule 100 mg  100 mg Oral BID Leighton Parody, PA-C   100 mg at 07/05/16 8453  . famotidine (PEPCID) tablet 20 mg  20 mg Oral BID Leighton Parody, PA-C   20 mg at 07/05/16 6468  . gabapentin (NEURONTIN) capsule 300 mg  300 mg Oral TID Leighton Parody, PA-C   300 mg at 07/05/16 0321  . hydrochlorothiazide (HYDRODIURIL) tablet 25 mg  25 mg Oral Daily Leighton Parody, PA-C      . HYDROmorphone (DILAUDID) injection 0.5 mg  0.5 mg Intravenous Q2H PRN Leighton Parody, PA-C      . loratadine (CLARITIN) tablet 10 mg  10 mg Oral Daily Leighton Parody, PA-C   10 mg at 07/05/16 2248  . menthol-cetylpyridinium (CEPACOL)  lozenge 3 mg  1 lozenge Oral PRN Leighton Parody, PA-C       Or  . phenol (CHLORASEPTIC) mouth spray 1 spray  1 spray Mouth/Throat PRN Leighton Parody, PA-C      . methocarbamol (ROBAXIN) tablet 500 mg  500 mg Oral Q6H PRN Leighton Parody, PA-C       Or  . methocarbamol (ROBAXIN) 500 mg in dextrose 5 % 50 mL IVPB  500 mg Intravenous Q6H PRN Leighton Parody, PA-C      . metoCLOPramide (REGLAN) tablet 5-10 mg  5-10 mg Oral Q8H PRN Leighton Parody, PA-C       Or  . metoCLOPramide (REGLAN) injection 5-10 mg  5-10 mg Intravenous Q8H PRN Leighton Parody, PA-C      . ondansetron [facility]) tablet 4 mg  4 mg Oral Q6H PRN Leighton Parody, PA-C       Or  . ondansetron [facility]) injection 4 mg  4 mg Intravenous Q6H PRN Leighton Parody, PA-C      . oxyCODONE (Oxy IR/ROXICODONE) immediate release tablet 5-10 mg  5-10 mg Oral Q3H PRN Leighton Parody, PA-C   5 mg at 07/05/16 0037  . senna-docusate (Senokot-S) tablet 1 tablet  1 tablet Oral QHS PRN Leighton Parody, PA-C      . sodium phosphate (FLEET) 7-19 GM/118ML enema 1 enema  1 enema Rectal Once PRN Leighton Parody, PA-C         Discharge Medications: Please see discharge summary for a list of discharge medications.  Relevant Imaging Results:  Relevant Lab Results:   Additional Information [id number]  Normajean Baxter, LCSW

## 2016-07-05 NOTE — Progress Notes (Signed)
Pt BP was 63/40. MD was in room at time of checking and told me to give pt a bolus of 345ml.After bolus pt BP was 107/44 Pt is stable and able to communicate clearly.

## 2016-07-06 ENCOUNTER — Inpatient Hospital Stay
Admission: RE | Admit: 2016-07-06 | Discharge: 2016-07-22 | Disposition: A | Discharge status: Home / Self Care | Payer: Medicare HMO | Source: Ambulatory Visit | Attending: Internal Medicine | Admitting: Internal Medicine

## 2016-07-06 DIAGNOSIS — I6529 Occlusion and stenosis of unspecified carotid artery: Secondary | ICD-10-CM | POA: Diagnosis not present

## 2016-07-06 DIAGNOSIS — G8929 Other chronic pain: Secondary | ICD-10-CM | POA: Diagnosis not present

## 2016-07-06 DIAGNOSIS — K219 Gastro-esophageal reflux disease without esophagitis: Secondary | ICD-10-CM | POA: Diagnosis not present

## 2016-07-06 DIAGNOSIS — M1611 Unilateral primary osteoarthritis, right hip: Secondary | ICD-10-CM | POA: Diagnosis not present

## 2016-07-06 DIAGNOSIS — Z96643 Presence of artificial hip joint, bilateral: Secondary | ICD-10-CM | POA: Diagnosis not present

## 2016-07-06 DIAGNOSIS — D509 Iron deficiency anemia, unspecified: Secondary | ICD-10-CM | POA: Diagnosis not present

## 2016-07-06 DIAGNOSIS — J3089 Other allergic rhinitis: Secondary | ICD-10-CM | POA: Diagnosis not present

## 2016-07-06 DIAGNOSIS — Z96641 Presence of right artificial hip joint: Secondary | ICD-10-CM | POA: Diagnosis not present

## 2016-07-06 DIAGNOSIS — D500 Iron deficiency anemia secondary to blood loss (chronic): Secondary | ICD-10-CM | POA: Diagnosis not present

## 2016-07-06 DIAGNOSIS — D649 Anemia, unspecified: Secondary | ICD-10-CM | POA: Diagnosis not present

## 2016-07-06 DIAGNOSIS — R011 Cardiac murmur, unspecified: Secondary | ICD-10-CM | POA: Diagnosis not present

## 2016-07-06 DIAGNOSIS — R262 Difficulty in walking, not elsewhere classified: Secondary | ICD-10-CM | POA: Diagnosis not present

## 2016-07-06 DIAGNOSIS — M6281 Muscle weakness (generalized): Secondary | ICD-10-CM | POA: Diagnosis not present

## 2016-07-06 DIAGNOSIS — I10 Essential (primary) hypertension: Secondary | ICD-10-CM | POA: Diagnosis not present

## 2016-07-06 DIAGNOSIS — S79911A Unspecified injury of right hip, initial encounter: Secondary | ICD-10-CM | POA: Diagnosis not present

## 2016-07-06 DIAGNOSIS — M1990 Unspecified osteoarthritis, unspecified site: Secondary | ICD-10-CM | POA: Diagnosis not present

## 2016-07-06 LAB — CBC
HCT: 23.1 % — ABNORMAL LOW (ref 36.0–46.0)
HEMOGLOBIN: 7.6 g/dL — AB (ref 12.0–15.0)
MCH: 26.4 pg (ref 26.0–34.0)
MCHC: 32.9 g/dL (ref 30.0–36.0)
MCV: 80.2 fL (ref 78.0–100.0)
Platelets: 207 10*3/uL (ref 150–400)
RBC: 2.88 MIL/uL — AB (ref 3.87–5.11)
RDW: 13.2 % (ref 11.5–15.5)
WBC: 13.8 10*3/uL — ABNORMAL HIGH (ref 4.0–10.5)

## 2016-07-06 NOTE — Clinical Social Work Note (Addendum)
Clinical Social Worker facilitated patient discharge including contacting patient family and facility to confirm patient discharge plans.  Clinical information faxed to facility and family agreeable with plan.  CSW arranged ambulance transport via [facility] to [facility].  RN to call [phone] report prior to discharge.  Per facility, authorization received from [facility].  Clinical Social Worker will sign off for now as social work intervention is no longer needed. Please consult [place] again if new need arises.  Elissa Hefty, LCSW Clinical Social Worker [phone]

## 2016-07-06 NOTE — Progress Notes (Signed)
PATIENT ID: AMIAYAH GIEBEL  MRN: [id number]  DOB/AGE:  1940-08-30 / 76 y.o.  2 Days Post-Op Procedure(s) (LRB): TOTAL HIP ARTHROPLASTY ANTERIOR APPROACH (Right)    PROGRESS NOTE Subjective: Patient is alert, oriented, no Nausea, no Vomiting, yes passing gas, . Taking PO well. Denies SOB, Chest or Calf Pain. Using Incentive Spirometer, PAS in place. Ambulate WBAT with pt walking over 70 ft with therapy yesterday Patient reports pain as  mild.  Pt denies feeling weak and dizzy.  Objective: Vital signs in last 24 hours: Vitals:   07/05/16 0831 07/05/16 1500 07/06/16 0024 07/06/16 0437  BP: (!) 100/45 (!) 114/54 (!) 106/56 (!) 116/54  Pulse: 94 69 72 79  Resp:  16 16 17   Temp:  98.2 F (36.8 C) 98.6 F (37 C)   TempSrc:  Oral Oral   SpO2: 98% 99% 99% 97%  Weight:      Height:          Intake/Output from previous day: I/O last 3 completed shifts: In: 720 [P.O.:720] Out: -    Intake/Output this shift: No intake/output data recorded.   LABORATORY DATA:  Recent Labs  07/04/16 0754 07/05/16 0742 07/06/16 0519  WBC  --  8.0 13.8*  HGB  --  9.1* 7.6*  HCT  --  28.5* 23.1*  PLT  --  247 207  INR 1.06  --   --     Examination: Neurologically intact Neurovascular intact Sensation intact distally Intact pulses distally Dorsiflexion/Plantar flexion intact Incision: dressing C/D/I No cellulitis present Compartment soft} XR AP&Lat of hip shows well placed\fixed THA  Assessment:   2 Days Post-Op Procedure(s) (LRB): TOTAL HIP ARTHROPLASTY ANTERIOR APPROACH (Right) ADDITIONAL DIAGNOSIS:  Expected Acute Blood Loss Anemia, Hypertension  Plan: PT/OT WBAT, THA  DVT Prophylaxis: SCDx72 hrs, ASA 325 mg BID x 2 weeks  DISCHARGE PLAN: Skilled Nursing Facility/Rehab  DISCHARGE NEEDS: HHPT, Walker and 3-in-1 comode seat

## 2016-07-06 NOTE — Progress Notes (Signed)
Physical Therapy Treatment Patient Details Name: Bailey Wilson MRN: [id number] DOB: [birth year] Today's Date: 07/06/2016    History of Present Illness Pt is a 76 y/o female s/p elective R THA secondary to R hip OA. PMH includes carotid stenosis, HTN, and heart murmur.     PT Comments    Pt required review of anterior precautions.  Able to recall 1/3.  Cues for safety with functional mobility.  Improved gait speed noted.  Pt continues to benefit from skilled nursing placement to continue rehab and improve safety before returning to private residence.     Follow Up Recommendations  SNF     Equipment Recommendations  None recommended by PT    Recommendations for Other Services       Precautions / Restrictions Precautions Precautions: Anterior Hip Precaution Booklet Issued: Yes (comment) Precaution Comments: Reviewed precautions with patient. Restrictions Weight Bearing Restrictions: Yes RLE Weight Bearing: Weight bearing as tolerated    Mobility  Bed Mobility               General bed mobility comments: Patient in chair as PTA entered room  Transfers Overall transfer level: Needs assistance Equipment used: Rolling walker (2 wheeled) Transfers: Sit to/from Stand Sit to Stand: Min guard         General transfer comment: Remains to require cues for technique to and from seated surface, particularly with hand placement to avoid pulling on RW.    Ambulation/Gait Ambulation/Gait assistance: Min guard Ambulation Distance (Feet): 110 Feet Assistive device: Rolling walker (2 wheeled) Gait Pattern/deviations: Step-through pattern;Decreased stride length;Shuffle;Trunk flexed Gait velocity: Decreased Gait velocity interpretation: Below normal speed for age/gender General Gait Details: Remains to require cues for upper trunk control and forward gaze.  Pt with improved proximity to RW.     Stairs            Wheelchair Mobility    Modified Rankin (Stroke Patients  Only)       Balance Overall balance assessment: Needs assistance Sitting-balance support: No upper extremity supported;Feet supported Sitting balance-Leahy Scale: Good     Standing balance support: Bilateral upper extremity supported;During functional activity Standing balance-Leahy Scale: Poor Standing balance comment: Reliant on RW for stability                             Cognition Arousal/Alertness: Awake/alert Behavior During Therapy: WFL for tasks assessed/performed Overall Cognitive Status: Within Functional Limits for tasks assessed                                        Exercises Total Joint Exercises Ankle Circles/Pumps: AROM;Both;10 reps;Seated Quad Sets: AROM;Right;10 reps;Seated Short Arc Quad: AROM;Right;10 reps;Supine Heel Slides: AROM;Right;10 reps;Supine Long Arc Quad: AROM;Right;10 reps;Seated Knee Flexion: AROM;Right;10 reps;Standing Marching in Standing: AROM;Right;10 reps;Standing    General Comments        Pertinent Vitals/Pain Pain Assessment: 0-10 Pain Score: 0-No pain    Home Living                      Prior Function            PT Goals (current goals can now be found in the care plan section) Acute Rehab PT Goals Patient Stated Goal: to increase independence Potential to Achieve Goals: Good Progress towards PT goals: Progressing toward goals    Frequency  7X/week      PT Plan Current plan remains appropriate    Co-evaluation              AM-PAC PT "6 Clicks" Daily Activity  Outcome Measure  Difficulty turning over in bed (including adjusting bedclothes, sheets and blankets)?: A Lot Difficulty moving from lying on back to sitting on the side of the bed? : A Lot Difficulty sitting down on and standing up from a chair with arms (e.g., wheelchair, bedside commode, etc,.)?: A Little Help needed moving to and from a bed to chair (including a wheelchair)?: A Little Help needed  walking in hospital room?: A Little Help needed climbing 3-5 steps with a railing? : A Little 6 Click Score: 16    End of Session Equipment Utilized During Treatment: Gait belt Activity Tolerance: Patient tolerated treatment well;Patient limited by pain Patient left: in chair;with call bell/phone within reach;with family/visitor present Nurse Communication: Mobility status PT Visit Diagnosis: Other abnormalities of gait and mobility (R26.89);Pain Pain - Right/Left: Right Pain - part of body: Hip     Time: 9767-3419 PT Time Calculation (min) (ACUTE ONLY): 19 min  Charges:  $Gait Training: 8-22 mins                    G Codes:       Governor Rooks, PTA pager [phone]    Cristela Blue 07/06/2016, 10:07 AM

## 2016-07-06 NOTE — Clinical Social Work Placement (Signed)
   CLINICAL SOCIAL WORK PLACEMENT  NOTE  Date:  07/06/2016  Patient Details  Name: Bailey Wilson MRN: [id number] Date of Birth: [birth year]  Clinical Social Work is seeking post-discharge placement for this patient at the [place] level of care (*CSW will initial, date and re-position this form in  chart as items are completed):  Yes   Patient/family provided with [facility] Work Department's list of facilities offering this level of care within the geographic area requested by the patient (or if unable, by the patient's family).  Yes   Patient/family informed of their freedom to choose among providers that offer the needed level of care, that participate in Medicare, Medicaid or managed care program needed by the patient, have an available bed and are willing to accept the patient.  Yes   Patient/family informed of [facility]'s ownership interest in [facility] and [name], as well as of the fact that they are under no obligation to receive care at these facilities.  PASRR submitted to EDS on       PASRR number received on 07/05/16     Existing PASRR number confirmed on 07/05/16     FL2 transmitted to all facilities in geographic area requested by pt/family on 07/05/16     FL2 transmitted to all facilities within larger geographic area on 07/05/16     Patient informed that his/her managed care company has contracts with or will negotiate with certain facilities, including the following:        Yes   Patient/family informed of bed offers received.  Patient chooses bed at [name]     Physician recommends and patient chooses bed at      Patient to be transferred to [name] on 07/06/16.  Patient to be transferred to facility by [facility]     Patient family notified on 07/06/16 of transfer.  Name of family member notified:  patient will f/u with daughter on own     PHYSICIAN Please prepare priority discharge  summary, including medications, Please prepare prescriptions, Please sign FL2     Additional Comment:    _______________________________________________ Normajean Baxter, LCSW 07/06/2016, 10:10 AM

## 2016-07-06 NOTE — Discharge Summary (Signed)
Patient ID: Bailey Wilson MRN: [id number] DOB/AGE: June 03, 1940 76 y.o.  Admit date: 07/04/2016 Discharge date: 07/06/2016  Admission Diagnoses:  Active Problems:   Arthritis of right hip   Discharge Diagnoses:  Same  Past Medical History:  Diagnosis Date  . Anemia   . Arthritis    "qwhere/Dr. Noemi Chapel" (07/04/2016)  . Asthma    "been told I've had it; been told I don't" (07/04/2016)  . Carotid stenosis 06/03/2016   1-39% bilateral ICA stenosis 05/2016.  . Environmental and seasonal allergies   . Family history of adverse reaction to anesthesia    son may have had sleep apnea had problem waking him up  . GERD (gastroesophageal reflux disease)   . Hypertension   . Murmur 05/06/2016  . Pneumonia 1980s X1  . Primary localized osteoarthritis of right knee     Surgeries: Procedure(s): TOTAL HIP ARTHROPLASTY ANTERIOR APPROACH on 07/04/2016   Consultants:   Discharged Condition: Improved  Hospital Course: Bailey Wilson is an 76 y.o. female who was admitted 07/04/2016 for operative treatment of<principal problem not specified>. Patient has severe unremitting pain that affects sleep, daily activities, and work/hobbies. After pre-op clearance the patient was taken to the operating room on 07/04/2016 and underwent  Procedure(s): TOTAL HIP ARTHROPLASTY ANTERIOR APPROACH.    Patient was given perioperative antibiotics: Anti-infectives    Start     Dose/Rate Route Frequency Ordered Stop   07/04/16 0900  vancomycin (VANCOCIN) IVPB 1000 mg/200 mL premix     1,000 mg 200 mL/hr over 60 Minutes Intravenous On call to O.R. 07/04/16 0277 07/04/16 0945       Patient was given sequential compression devices, early ambulation, and chemoprophylaxis to prevent DVT.  Patient benefited maximally from hospital stay and there were no complications.    Recent vital signs: Patient Vitals for the past 24 hrs:  BP Temp Temp src Pulse Resp SpO2  07/06/16 0437 (!) 116/54 - - 79 17 97 %  07/06/16 0024 (!)  106/56 98.6 F (37 C) Oral 72 16 99 %  07/05/16 1500 (!) 114/54 98.2 F (36.8 C) Oral 69 16 99 %  07/05/16 0831 (!) 100/45 - - 94 - 98 %     Recent laboratory studies:  Recent Labs  07/04/16 0754 07/05/16 0742 07/06/16 0519  WBC  --  8.0 13.8*  HGB  --  9.1* 7.6*  HCT  --  28.5* 23.1*  PLT  --  247 207  INR 1.06  --   --      Discharge Medications:   Allergies as of 07/06/2016      Reactions   Penicillins Hives, Itching   Has patient had a PCN reaction causing immediate rash, facial/tongue/throat swelling, SOB or lightheadedness with hypotension: YES Has patient had a PCN reaction causing severe rash involving mucus membranes or skin necrosis: No Has patient had a PCN reaction that required hospitalization No Has patient had a PCN reaction occurring within the last 10 years: No If all of the above answers are "NO", then may proceed with Cephalosporin use.   Ace Inhibitors Cough   Aspirin Other (See Comments)   Heart racing (large doses)   Sulfa Antibiotics Itching, Rash      Medication List    STOP taking these medications   nabumetone 500 MG tablet Commonly known as:  RELAFEN   naproxen sodium 220 MG tablet Commonly known as:  ANAPROX     TAKE these medications   albuterol 108 (90 Base) MCG/ACT inhaler  Commonly known as:  PROVENTIL HFA;VENTOLIN HFA Inhale 2 puffs into the lungs every 6 (six) hours as needed for wheezing or shortness of breath.   amLODipine 5 MG tablet Commonly known as:  NORVASC Take 5 mg by mouth daily.   amLODipine 10 MG tablet Commonly known as:  NORVASC Take 1 tablet (10 mg total) by mouth daily.   ARTIFICIAL TEAR OP Apply 2 drops to eye daily.   aspirin EC 325 MG tablet Take 1 tablet (325 mg total) by mouth 2 (two) times daily. What changed:  medication strength  how much to take  when to take this   BIOFREEZE EX Apply 1 application topically daily as needed (pain).   Calcium Carbonate-Vitamin D 600-200 MG-UNIT  Tabs Take 1 tablet by mouth daily.   cetirizine 10 MG tablet Commonly known as:  ZYRTEC Take 10 mg by mouth daily.   Cranberry 450 MG Tabs Take 900 mg by mouth daily.   docusate sodium 100 MG capsule Commonly known as:  COLACE Take 100 mg by mouth daily after breakfast.   hydrochlorothiazide 25 MG tablet Commonly known as:  HYDRODIURIL Take 25 mg by mouth daily.   ICAPS MV PO Take 1 capsule by mouth daily.   oxyCODONE-acetaminophen 5-325 MG tablet Commonly known as:  ROXICET Take 1-2 tablets by mouth every 4 (four) hours as needed.   ranitidine 150 MG tablet Commonly known as:  ZANTAC Take 150 mg by mouth 2 (two) times daily.   sodium chloride 0.65 % Soln nasal spray Commonly known as:  OCEAN Place 1 spray into both nostrils as needed for congestion.   tiZANidine 2 MG tablet Commonly known as:  ZANAFLEX Take 1 tablet (2 mg total) by mouth every 6 (six) hours as needed for muscle spasms.            Durable Medical Equipment        Start     Ordered   07/04/16 1606  DME Walker rolling  Once    Question:  Patient needs a walker to treat with the following condition  Answer:  Arthritis of right hip   07/04/16 1605   07/04/16 1606  DME 3 n 1  Once     07/04/16 1605   07/04/16 1606  DME Bedside commode  Once    Question:  Patient needs a bedside commode to treat with the following condition  Answer:  Arthritis of right hip   07/04/16 1605      Diagnostic Studies: Dg C-arm 1-60 Min  Result Date: 07/04/2016 CLINICAL DATA:  Osteoarthritis. EXAM: OPERATIVE RIGHT HIP (WITH PELVIS IF PERFORMED) 1 VIEW; RF- DG C-ARM 61-120 min FLUOROSCOPY TIME:  13 SECONDS C-arm fluoroscopic images were obtained intraoperatively and submitted for post operative interpretation. Please see the performing provider's procedural report for the fluoroscopy time utilized. TECHNIQUE: Fluoroscopic spot image(s) were submitted for interpretation post-operatively. COMPARISON:  None. FINDINGS: The  acetabular and femoral components of the right total hip prosthesis appear in good position in the AP projection. No fracture. IMPRESSION: Satisfactory appearance of the right hip in the AP projection after total hip prosthesis insertion. Electronically Signed   By: Lorriane Shire M.D.   On: 07/04/2016 10:23   Dg Hip Operative Unilat W Or W/o Pelvis Right  Result Date: 07/04/2016 CLINICAL DATA:  Osteoarthritis. EXAM: OPERATIVE RIGHT HIP (WITH PELVIS IF PERFORMED) 1 VIEW; RF- DG C-ARM 61-120 min FLUOROSCOPY TIME:  13 SECONDS C-arm fluoroscopic images were obtained intraoperatively and submitted for post operative  interpretation. Please see the performing provider's procedural report for the fluoroscopy time utilized. TECHNIQUE: Fluoroscopic spot image(s) were submitted for interpretation post-operatively. COMPARISON:  None. FINDINGS: The acetabular and femoral components of the right total hip prosthesis appear in good position in the AP projection. No fracture. IMPRESSION: Satisfactory appearance of the right hip in the AP projection after total hip prosthesis insertion. Electronically Signed   By: Lorriane Shire M.D.   On: 07/04/2016 10:23    Disposition: 01-Home or Self Care  Discharge Instructions    Call MD / Call 911    Complete by:  As directed    If you experience chest pain or shortness of breath, CALL 911 and be transported to the hospital emergency room.  If you develope a fever above 101 F, pus (white drainage) or increased drainage or redness at the wound, or calf pain, call your surgeon's office.   Constipation Prevention    Complete by:  As directed    Drink plenty of fluids.  Prune juice may be helpful.  You may use a stool softener, such as Colace (over the counter) 100 mg twice a day.  Use MiraLax (over the counter) for constipation as needed.   Diet - low sodium heart healthy    Complete by:  As directed    Driving restrictions    Complete by:  As directed    No driving for 2  weeks   Follow the hip precautions as taught in Physical Therapy    Complete by:  As directed    Increase activity slowly as tolerated    Complete by:  As directed    Patient may shower    Complete by:  As directed    You may shower without a dressing once there is no drainage.  Do not wash over the wound.  If drainage remains, cover wound with plastic wrap and then shower.       Contact information for follow-up providers    Frederik Pear, MD Follow up in 2 week(s).   Specialty:  Orthopedic Surgery Contact information: [place] [address] [phone]            Contact information for after-discharge care    [facility] Follow up.   Specialty:  Skilled Nursing Facility Contact information: [address] [place] [phone]                   Signed: Hardin Negus, Aadarsh Cozort R 07/06/2016, 8:01 AM

## 2016-07-06 NOTE — Progress Notes (Signed)
Called report to [facility], talked to [place].  Reviewed discharge instructions/medications with patient.  Answered her questions.  Patient is waiting on transport.

## 2016-07-07 ENCOUNTER — Encounter: Payer: Self-pay | Admitting: Internal Medicine

## 2016-07-07 ENCOUNTER — Other Ambulatory Visit: Payer: Self-pay

## 2016-07-07 ENCOUNTER — Encounter (HOSPITAL_COMMUNITY)
Admission: RE | Admit: 2016-07-07 | Discharge: 2016-07-07 | Disposition: A | Discharge status: Home / Self Care | Payer: Medicare HMO | Source: Skilled Nursing Facility | Attending: Internal Medicine | Admitting: Internal Medicine

## 2016-07-07 ENCOUNTER — Non-Acute Institutional Stay (SKILLED_NURSING_FACILITY): Payer: Medicare HMO | Admitting: Internal Medicine

## 2016-07-07 DIAGNOSIS — I10 Essential (primary) hypertension: Secondary | ICD-10-CM | POA: Diagnosis not present

## 2016-07-07 DIAGNOSIS — D509 Iron deficiency anemia, unspecified: Secondary | ICD-10-CM | POA: Insufficient documentation

## 2016-07-07 DIAGNOSIS — D649 Anemia, unspecified: Secondary | ICD-10-CM

## 2016-07-07 DIAGNOSIS — Z96641 Presence of right artificial hip joint: Secondary | ICD-10-CM

## 2016-07-07 DIAGNOSIS — J3089 Other allergic rhinitis: Secondary | ICD-10-CM

## 2016-07-07 LAB — BASIC METABOLIC PANEL
ANION GAP: 8 (ref 5–15)
BUN: 20 mg/dL (ref 6–20)
CALCIUM: 9.5 mg/dL (ref 8.9–10.3)
CO2: 27 mmol/L (ref 22–32)
CREATININE: 0.97 mg/dL (ref 0.44–1.00)
Chloride: 99 mmol/L — ABNORMAL LOW (ref 101–111)
GFR calc Af Amer: >60 mL/min (ref >60)
GFR, EST NON AFRICAN AMERICAN: 55 mL/min — AB (ref >60)
Glucose, Bld: 98 mg/dL (ref 65–99)
Potassium: 4 mmol/L (ref 3.5–5.1)
SODIUM: 134 mmol/L — AB (ref 135–145)

## 2016-07-07 LAB — CBC WITH DIFFERENTIAL/PLATELET
BASOS ABS: 0.1 10*3/uL (ref 0.0–0.1)
BASOS PCT: 0 %
EOS ABS: 0.5 10*3/uL (ref 0.0–0.7)
EOS PCT: 4 %
HCT: 26.4 % — ABNORMAL LOW (ref 36.0–46.0)
Hemoglobin: 8.9 g/dL — ABNORMAL LOW (ref 12.0–15.0)
Lymphocytes Relative: 22 %
Lymphs Abs: 2.4 10*3/uL (ref 0.7–4.0)
MCH: 27.5 pg (ref 26.0–34.0)
MCHC: 33.7 g/dL (ref 30.0–36.0)
MCV: 81.5 fL (ref 78.0–100.0)
MONOS PCT: 7 %
Monocytes Absolute: 0.8 10*3/uL (ref 0.1–1.0)
NEUTROS ABS: 7.6 10*3/uL (ref 1.7–7.7)
Neutrophils Relative %: 67 %
PLATELETS: 297 10*3/uL (ref 150–400)
RBC: 3.24 MIL/uL — ABNORMAL LOW (ref 3.87–5.11)
RDW: 13.6 % (ref 11.5–15.5)
WBC: 11.3 10*3/uL — ABNORMAL HIGH (ref 4.0–10.5)

## 2016-07-07 MED ORDER — OXYCODONE-ACETAMINOPHEN 5-325 MG PO TABS: ORAL_TABLET | ORAL | 0 refills | Status: DC

## 2016-07-07 NOTE — Telephone Encounter (Signed)
RX faxed to [facility] @ [phone]. Phone number [phone]  

## 2016-07-07 NOTE — Progress Notes (Signed)
Provider:  Veleta Miners Location:   [place] Room Number: 294/T Place of Service:  SNF (31)  PCP: Sharilyn Sites, MD Patient Care Team: Sharilyn Sites, MD as PCP - General [facility] Medicine)  Extended Emergency Contact Information Primary Emergency Contact: [facility] Address: [address] [place], [place] [address] Johnnette Litter of [place] Phone: [phone] Relation: Daughter Secondary Emergency Contact: Werner Lean, [place] [address] Johnnette Litter of [place] Mobile Phone: [phone] Relation: Son  Code Status: Full Code Goals of Care: Advanced Directive information Advanced Directives 07/07/2016  Does Patient Have a Medical Advance Directive? Yes  Type of Advance Directive (No Data)  Does patient want to make changes to medical advance directive? No - Patient declined  Would patient like information on creating a medical advance directive? No - Patient declined      Chief Complaint  Patient presents with  . New Admit To SNF    HPI: Patient is a 76 y.o. female seen today for admission to SNF for therapy after undergoing  Right Total Hip Arthroplasty anterior Approach on 07/04/2016.  Patient has h/o Hypertension, and arthritis  who has Unremitting pain in her right hip and underwent above mentioned procedure.  She had non eventful recovery after Surgery and is now in SNF for further therapy. Patient is doing well in the facility. Her pain is controlled and she was able to walk with the walker .  She lives at home by herself but has family around her. She was very independent before surgery. Was driving. She wants to go home when she is more independent.    Past Medical History:  Diagnosis Date  . Anemia   . Arthritis    "qwhere/Dr. Noemi Chapel" (07/04/2016)  . Asthma    "been told I've had it; been told I don't" (07/04/2016)  . Carotid stenosis 06/03/2016   1-39% bilateral ICA stenosis 05/2016.  .  Environmental and seasonal allergies   . Family history of adverse reaction to anesthesia    son may have had sleep apnea had problem waking him up  . GERD (gastroesophageal reflux disease)   . Hypertension   . Murmur 05/06/2016  . Pneumonia 1980s X1  . Primary localized osteoarthritis of right knee    Past Surgical History:  Procedure Laterality Date  . ABDOMINAL HYSTERECTOMY  1985  . CATARACT EXTRACTION W/ INTRAOCULAR LENS  IMPLANT, BILATERAL Bilateral 2002-2007   "left-right"  . COLONOSCOPY    . [place]   "prior to hysterectomy"  . FRACTURE SURGERY    . JOINT REPLACEMENT    . NASAL FRACTURE SURGERY  2016  . TONSILLECTOMY    . 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 TOTAL HIP ARTHROPLASTY Right 07/04/2016   Procedure: TOTAL HIP ARTHROPLASTY ANTERIOR APPROACH;  Surgeon: Frederik Pear, MD;  Location: [place];  Service: Orthopedics;  Laterality: Right;    reports that she has never smoked. She has never used smokeless tobacco. She reports that she does not drink alcohol or use drugs. Social History   Social History  . Marital status: Widowed    Spouse name: N/A  . Number of children: N/A  . Years of education: N/A   Occupational History  . Not on file.   Social History Main Topics  . Smoking status: Never Smoker  . Smokeless tobacco: Never Used  . Alcohol use No  .  Drug use: No  . Sexual activity: No   Other Topics Concern  . Not on file   Social History Narrative  . No narrative on file    Functional Status Survey:    Family History  Problem Relation Age of Onset  . Asthma Mother   . Arrhythmia Mother   . Heart disease Mother   . Heart disease Father   . Clotting disorder Father   . Hypertension Paternal Grandfather   . Blindness Paternal Grandmother     Health Maintenance  Topic Date Due  . TETANUS/TDAP  06/20/1958  . PNA vac Low Risk Adult (1 of 2 - PCV13) 01/14/2017 (Originally 06/19/2004)  . INFLUENZA VACCINE   09/14/2016  . DEXA SCAN  Completed    Allergies  Allergen Reactions  . Penicillins Hives and Itching    Has patient had a PCN reaction causing immediate rash, facial/tongue/throat swelling, SOB or lightheadedness with hypotension: YES Has patient had a PCN reaction causing severe rash involving mucus membranes or skin necrosis: No Has patient had a PCN reaction that required hospitalization No Has patient had a PCN reaction occurring within the last 10 years: No If all of the above answers are "NO", then may proceed with Cephalosporin use.   . Ace Inhibitors Cough  . Aspirin Other (See Comments)    Heart racing (large doses)  . Sulfa Antibiotics Itching and Rash    Allergies as of 07/07/2016      Reactions   Penicillins Hives, Itching   Has patient had a PCN reaction causing immediate rash, facial/tongue/throat swelling, SOB or lightheadedness with hypotension: YES Has patient had a PCN reaction causing severe rash involving mucus membranes or skin necrosis: No Has patient had a PCN reaction that required hospitalization No Has patient had a PCN reaction occurring within the last 10 years: No If all of the above answers are "NO", then may proceed with Cephalosporin use.   Ace Inhibitors Cough   Aspirin Other (See Comments)   Heart racing (large doses)   Sulfa Antibiotics Itching, Rash      Medication List       Accurate as of 07/07/16 10:47 AM. Always use your most recent med list.          albuterol 108 (90 Base) MCG/ACT inhaler Commonly known as:  PROVENTIL HFA;VENTOLIN HFA Inhale 2 puffs into the lungs every 6 (six) hours as needed for wheezing or shortness of breath.   amLODipine 5 MG tablet Commonly known as:  NORVASC Take 5 mg by mouth daily.   ARTIFICIAL TEAR OP Apply 2 drops to eye daily.   aspirin EC 325 MG tablet Take 1 tablet (325 mg total) by mouth 2 (two) times daily.   BIOFREEZE EX Apply 1 application topically daily as needed (pain).   Calcium  Carbonate-Vitamin D 600-200 MG-UNIT Tabs Take 1 tablet by mouth daily.   cetirizine 10 MG tablet Commonly known as:  ZYRTEC Take 10 mg by mouth daily.   Cranberry 450 MG Tabs Take 900 mg by mouth daily.   docusate sodium 100 MG capsule Commonly known as:  COLACE Take 100 mg by mouth daily after breakfast.   hydrochlorothiazide 25 MG tablet Commonly known as:  HYDRODIURIL Take 25 mg by mouth daily.   ICAPS MV PO Take 1 capsule by mouth daily.   oxyCODONE-acetaminophen 5-325 MG tablet Commonly known as:  ROXICET 1 by mouth every 4 hours as needed for pain or 2 by mouth every 4 hours as needed  for severe pain DO NOT EXCEED 3GM OF APAP IN 24 HOURS FROM ALL SOURCES   ranitidine 150 MG tablet Commonly known as:  ZANTAC Take 150 mg by mouth 2 (two) times daily.   sodium chloride 0.65 % Soln nasal spray Commonly known as:  OCEAN Place 1 spray into both nostrils as needed for congestion.   tiZANidine 2 MG tablet Commonly known as:  ZANAFLEX Take 1 tablet (2 mg total) by mouth every 6 (six) hours as needed for muscle spasms.       Review of Systems  Review of Systems  Constitutional: Negative for activity change, appetite change, chills, diaphoresis, fatigue and fever.  HENT: Negative for mouth sores, postnasal drip, rhinorrhea, sinus pain and sore throat.   Respiratory: Negative for apnea, cough, chest tightness, shortness of breath and wheezing.   Cardiovascular: Negative for chest pain, palpitations and leg swelling.  Gastrointestinal: Negative for abdominal distention, abdominal pain, constipation, diarrhea, nausea and vomiting.  Genitourinary: Negative for dysuria and frequency.  Musculoskeletal: Negative for arthralgias, joint swelling and myalgias.  Skin: Negative for rash.  Neurological: Negative for dizziness, syncope, weakness, light-headedness and numbness.  Psychiatric/Behavioral: Negative for behavioral problems, confusion and sleep disturbance.     Vitals:     07/07/16 1031  BP: 125/65  Pulse: 78  Resp: 20  Temp: 97.3 F (36.3 C)  TempSrc: Oral  SpO2: 96%   There is no height or weight on file to calculate BMI. Physical Exam  Constitutional: She is oriented to person, place, and time. She appears well-developed and well-nourished.  HENT:  Head: Normocephalic.  Mouth/Throat: Oropharynx is clear and moist.  Eyes: Pupils are equal, round, and reactive to light.  Neck: Neck supple.  Cardiovascular: Normal rate and normal heart sounds.  An irregular rhythm present.  No murmur heard. Pulmonary/Chest: Effort normal and breath sounds normal. No respiratory distress. She has no wheezes. She has no rales.  Abdominal: Soft. Bowel sounds are normal. She exhibits no distension. There is no tenderness. There is no rebound.  Musculoskeletal: She exhibits no edema.  Neurological: She is alert and oriented to person, place, and time.  Had good Strength in all extremities except right LE  Skin: Skin is warm and dry. No rash noted. No erythema. No pallor.  Psychiatric: She has a normal mood and affect. Her behavior is normal. Judgment and thought content normal.    Labs reviewed: Basic Metabolic Panel:  Recent Labs  04/22/16 1132 06/24/16 1402 07/07/16 0612  NA 125* 136 134*  K 3.4* 3.5 4.0  CL 88* 99* 99*  CO2 26 25 27   GLUCOSE 100* 96 98  BUN 10 28* 20  CREATININE 0.79 1.24* 0.97  CALCIUM 9.8 10.0 9.5   Liver Function Tests:  Recent Labs  04/22/16 1132  AST 17  ALT 12*  ALKPHOS 77  BILITOT 0.9  PROT 7.3  ALBUMIN 3.5   No results for input(s): LIPASE, AMYLASE in the last 8760 hours. No results for input(s): AMMONIA in the last 8760 hours. CBC:  Recent Labs  04/22/16 1132  07/05/16 0742 07/06/16 0519 07/07/16 0612  WBC 9.7  < > 8.0 13.8* 11.3*  NEUTROABS 6.9  --   --   --  7.6  HGB 11.0*  < > 9.1* 7.6* 8.9*  HCT 32.9*  < > 28.5* 23.1* 26.4*  MCV 80.0  < > 84.1 80.2 81.5  PLT 291  < > 247 207 297  < > = values in  this interval not  displayed. Cardiac Enzymes: No results for input(s): CKTOTAL, CKMB, CKMBINDEX, TROPONINI in the last 8760 hours. BNP: Invalid input(s): POCBNP No results found for: HGBA1C No results found for: TSH No results found for: VITAMINB12 No results found for: FOLATE No results found for: IRON, TIBC, FERRITIN  Imaging and Procedures obtained prior to SNF admission: No results found.  Assessment/Plan H/O total hip arthroplasty, right Patient doing well with Pain management. Doing well with Therapy. Follow up with Ortho Also on aspirin for 2 weeks for DVT  Disposition home   Essential hypertension Well controlled on Norvasc and HCTZ  Environmental and seasonal allergies On Cetrizine  Anemia Most likely Post op. Will follow and repeat CBC on Mon.     Family/ staff Communication:   Labs/tests ordered: CBC and BMP in 1 week.

## 2016-07-12 ENCOUNTER — Encounter (HOSPITAL_COMMUNITY)
Admission: RE | Admit: 2016-07-12 | Discharge: 2016-07-12 | Disposition: A | Discharge status: Home / Self Care | Payer: Medicare HMO | Source: Skilled Nursing Facility | Attending: Internal Medicine | Admitting: Internal Medicine

## 2016-07-12 DIAGNOSIS — G8929 Other chronic pain: Secondary | ICD-10-CM | POA: Insufficient documentation

## 2016-07-12 DIAGNOSIS — Z96641 Presence of right artificial hip joint: Secondary | ICD-10-CM | POA: Insufficient documentation

## 2016-07-12 DIAGNOSIS — I10 Essential (primary) hypertension: Secondary | ICD-10-CM | POA: Insufficient documentation

## 2016-07-12 DIAGNOSIS — Z471 Aftercare following joint replacement surgery: Secondary | ICD-10-CM | POA: Insufficient documentation

## 2016-07-12 DIAGNOSIS — D509 Iron deficiency anemia, unspecified: Secondary | ICD-10-CM | POA: Insufficient documentation

## 2016-07-12 LAB — CBC
HCT: 28.1 % — ABNORMAL LOW (ref 36.0–46.0)
HEMOGLOBIN: 9.1 g/dL — AB (ref 12.0–15.0)
MCH: 26.8 pg (ref 26.0–34.0)
MCHC: 32.4 g/dL (ref 30.0–36.0)
MCV: 82.6 fL (ref 78.0–100.0)
Platelets: 387 10*3/uL (ref 150–400)
RBC: 3.4 MIL/uL — ABNORMAL LOW (ref 3.87–5.11)
RDW: 13.7 % (ref 11.5–15.5)
WBC: 9.3 10*3/uL (ref 4.0–10.5)

## 2016-07-12 LAB — BASIC METABOLIC PANEL
ANION GAP: 10 (ref 5–15)
BUN: 20 mg/dL (ref 6–20)
CALCIUM: 10.1 mg/dL (ref 8.9–10.3)
CHLORIDE: 95 mmol/L — AB (ref 101–111)
CO2: 28 mmol/L (ref 22–32)
CREATININE: 0.94 mg/dL (ref 0.44–1.00)
GFR calc non Af Amer: 57 mL/min — ABNORMAL LOW (ref >60)
Glucose, Bld: 105 mg/dL — ABNORMAL HIGH (ref 65–99)
Potassium: 3.5 mmol/L (ref 3.5–5.1)
SODIUM: 133 mmol/L — AB (ref 135–145)

## 2016-07-18 ENCOUNTER — Encounter (HOSPITAL_COMMUNITY)
Admission: RE | Admit: 2016-07-18 | Discharge: 2016-07-18 | Disposition: A | Discharge status: Home / Self Care | Payer: Medicare HMO | Source: Skilled Nursing Facility | Attending: Internal Medicine | Admitting: Internal Medicine

## 2016-07-18 DIAGNOSIS — D509 Iron deficiency anemia, unspecified: Secondary | ICD-10-CM | POA: Insufficient documentation

## 2016-07-18 DIAGNOSIS — Z471 Aftercare following joint replacement surgery: Secondary | ICD-10-CM | POA: Insufficient documentation

## 2016-07-18 DIAGNOSIS — M1611 Unilateral primary osteoarthritis, right hip: Secondary | ICD-10-CM | POA: Diagnosis not present

## 2016-07-18 DIAGNOSIS — G8929 Other chronic pain: Secondary | ICD-10-CM | POA: Insufficient documentation

## 2016-07-18 DIAGNOSIS — Z96641 Presence of right artificial hip joint: Secondary | ICD-10-CM | POA: Insufficient documentation

## 2016-07-18 DIAGNOSIS — I10 Essential (primary) hypertension: Secondary | ICD-10-CM | POA: Insufficient documentation

## 2016-07-18 LAB — CBC
HCT: 24.3 % — ABNORMAL LOW (ref 36.0–46.0)
Hemoglobin: 7.9 g/dL — ABNORMAL LOW (ref 12.0–15.0)
MCH: 26.6 pg (ref 26.0–34.0)
MCHC: 32.5 g/dL (ref 30.0–36.0)
MCV: 81.8 fL (ref 78.0–100.0)
PLATELETS: 313 10*3/uL (ref 150–400)
RBC: 2.97 MIL/uL — ABNORMAL LOW (ref 3.87–5.11)
RDW: 13.6 % (ref 11.5–15.5)
WBC: 7.5 10*3/uL (ref 4.0–10.5)

## 2016-07-18 LAB — BASIC METABOLIC PANEL
ANION GAP: 9 (ref 5–15)
BUN: 22 mg/dL — ABNORMAL HIGH (ref 6–20)
CALCIUM: 9.9 mg/dL (ref 8.9–10.3)
CO2: 28 mmol/L (ref 22–32)
CREATININE: 0.86 mg/dL (ref 0.44–1.00)
Chloride: 101 mmol/L (ref 101–111)
GFR calc Af Amer: >60 mL/min (ref >60)
GLUCOSE: 96 mg/dL (ref 65–99)
Potassium: 3.7 mmol/L (ref 3.5–5.1)
Sodium: 138 mmol/L (ref 135–145)

## 2016-07-19 ENCOUNTER — Non-Acute Institutional Stay (SKILLED_NURSING_FACILITY): Payer: Medicare HMO | Admitting: Internal Medicine

## 2016-07-19 ENCOUNTER — Encounter: Payer: Self-pay | Admitting: Internal Medicine

## 2016-07-19 DIAGNOSIS — Z96641 Presence of right artificial hip joint: Secondary | ICD-10-CM | POA: Diagnosis not present

## 2016-07-19 DIAGNOSIS — D649 Anemia, unspecified: Secondary | ICD-10-CM

## 2016-07-19 NOTE — Progress Notes (Signed)
Location:   [place] Room Number: 297/L Place of Service:  SNF [phone]) Provider:  Edd Fabian, MD  Patient Care Team: Sharilyn Sites, MD as PCP - General [facility] Medicine)  Extended Emergency Contact Information Primary Emergency Contact: [facility] Address: [address] [place], [place] [address] Johnnette Litter of [place] Phone: [phone] Relation: Daughter Secondary Emergency Contact: Werner Lean, [place] [address] Johnnette Litter of [place] Mobile Phone: [phone] Relation: Son  Code Status:  Full Code Goals of care: Advanced Directive information Advanced Directives 07/19/2016  Does Patient Have a Medical Advance Directive? Yes  Type of Advance Directive (No Data)  Does patient want to make changes to medical advance directive? No - Patient declined  Would patient like information on creating a medical advance directive? No - Patient declined     Chief Complaint  Patient presents with  . Acute Visit    Anemia    HPI:  Pt is a 76 y.o. female seen today for an acute visit for Anemia  Patient was admitted to SNF after undergoing Right Hip Arthroplasty . She has done well in facility. Her Hgb has drifted down and is now 7.9 from 9.1 on admission. Patient denies any weakness, SOB, Chest pain Doziness. She says she has previous work up of Anemia with colonoscopy. And she was on iron before for her Anemia. Denies any blood in stool.  Patient doing well with therapy and is walking with the walker and would be discharged soon to home with her daughter.    Past Medical History:  Diagnosis Date  . Anemia   . Arthritis    "qwhere/Dr. Noemi Chapel" (07/04/2016)  . Asthma    "been told I've had it; been told I don't" (07/04/2016)  . Carotid stenosis 06/03/2016   1-39% bilateral ICA stenosis 05/2016.  . Environmental and seasonal allergies   . Family history of adverse reaction to anesthesia    son may  have had sleep apnea had problem waking him up  . GERD (gastroesophageal reflux disease)   . Hypertension   . Murmur 05/06/2016  . Pneumonia 1980s X1  . Primary localized osteoarthritis of right knee    Past Surgical History:  Procedure Laterality Date  . ABDOMINAL HYSTERECTOMY  1985  . CATARACT EXTRACTION W/ INTRAOCULAR LENS  IMPLANT, BILATERAL Bilateral 2002-2007   "left-right"  . COLONOSCOPY    . [place]   "prior to hysterectomy"  . FRACTURE SURGERY    . JOINT REPLACEMENT    . NASAL FRACTURE SURGERY  2016  . TONSILLECTOMY    . 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 TOTAL HIP ARTHROPLASTY Right 07/04/2016   Procedure: TOTAL HIP ARTHROPLASTY ANTERIOR APPROACH;  Surgeon: Frederik Pear, MD;  Location: [facility];  Service: Orthopedics;  Laterality: Right;    Allergies  Allergen Reactions  . Penicillins Hives and Itching    Has patient had a PCN reaction causing immediate rash, facial/tongue/throat swelling, SOB or lightheadedness with hypotension: YES Has patient had a PCN reaction causing severe rash involving mucus membranes or skin necrosis: No Has patient had a PCN reaction that required hospitalization No Has patient had a PCN reaction occurring within the last 10 years: No If all of the above answers are "NO", then may proceed with Cephalosporin use.   . Ace Inhibitors Cough  . Aspirin Other (See Comments)  Heart racing (large doses)  . Sulfa Antibiotics Itching and Rash    Outpatient Encounter Prescriptions as of 07/19/2016  Medication Sig  . albuterol (PROVENTIL HFA;VENTOLIN HFA) 108 (90 Base) MCG/ACT inhaler Inhale 2 puffs into the lungs every 6 (six) hours as needed for wheezing or shortness of breath.  [name] amLODipine (NORVASC) 5 MG tablet Take 5 mg by mouth daily.  . ARTIFICIAL TEAR OP Apply 2 drops to eye daily.   [name] aspirin EC 325 MG tablet Take 1 tablet (325 mg total) by mouth 2 (two) times daily.  . Calcium Carbonate-Vitamin D  (CALCIUM + D) 600-200 MG-UNIT TABS Take 1 tablet by mouth daily.   . cetirizine (ZYRTEC) 10 MG tablet Take 10 mg by mouth daily.  . Cranberry 450 MG TABS Take 900 mg by mouth daily.  [name] docusate sodium (COLACE) 100 MG capsule Take 100 mg by mouth daily after breakfast.   . hydrochlorothiazide (HYDRODIURIL) 25 MG tablet Take 25 mg by mouth daily.  . Menthol, Topical Analgesic, (BIOFREEZE EX) Apply 1 application topically daily as needed (pain).  . Multiple Vitamins-Minerals (ICAPS MV PO) Take 1 capsule by mouth daily.  [name] oxyCODONE-acetaminophen (ROXICET) 5-325 MG tablet 1 by mouth every 4 hours as needed for pain or 2 by mouth every 4 hours as needed for severe pain DO NOT EXCEED 3GM OF APAP IN 24 HOURS FROM ALL SOURCES  . ranitidine (ZANTAC) 150 MG tablet Take 150 mg by mouth 2 (two) times daily.  . sodium chloride (OCEAN) 0.65 % SOLN nasal spray Place 1 spray into both nostrils as needed for congestion.  [name] tiZANidine (ZANAFLEX) 2 MG tablet Take 1 tablet (2 mg total) by mouth every 6 (six) hours as needed for muscle spasms.   No facility-administered encounter medications on file as of 07/19/2016.      Review of Systems  Review of Systems  Constitutional: Negative for activity change, appetite change, chills, diaphoresis, fatigue and fever.  HENT: Negative for mouth sores, postnasal drip, rhinorrhea, sinus pain and sore throat.   Respiratory: Negative for apnea, cough, chest tightness, shortness of breath and wheezing.   Cardiovascular: Negative for chest pain, palpitations and leg swelling.  Gastrointestinal: Negative for abdominal distention, abdominal pain, constipation, diarrhea, nausea and vomiting.  Genitourinary: Negative for dysuria and frequency.  Musculoskeletal: Negative for arthralgias, joint swelling and myalgias.  Skin: Negative for rash.  Neurological: Negative for dizziness, syncope, weakness, light-headedness and numbness.  Psychiatric/Behavioral: Negative for behavioral  problems, confusion and sleep disturbance.     There is no immunization history on file for this patient. Pertinent  Health Maintenance Due  Topic Date Due  . PNA vac Low Risk Adult (1 of 2 - PCV13) 01/14/2017 (Originally 06/19/2004)  . INFLUENZA VACCINE  09/14/2016  . DEXA SCAN  Completed   No flowsheet data found. Functional Status Survey:    Vitals:   07/19/16 1320  BP: 109/61  Pulse: 81  Resp: 16  Temp: 99.2 F (37.3 C)  TempSrc: Oral   There is no height or weight on file to calculate BMI. Physical Exam Physical Exam  Constitutional: She is oriented to person, place, and time. She appears well-developed and well-nourished.  HENT:  Head: Normocephalic.  Mouth/Throat: Oropharynx is clear and moist.  Eyes: Pupils are equal, round, and reactive to light.  Neck: Neck supple.  Cardiovascular: Normal rate and normal heart sounds.  An irregular rhythm present.  No murmur heard. Pulmonary/Chest: Effort normal and breath sounds normal. No respiratory distress. She  has no wheezes. She has no rales.  Abdominal: Soft. Bowel sounds are normal. She exhibits no distension. There is no tenderness. There is no rebound.  Musculoskeletal: She exhibits no edema.  Neurological: She is alert and oriented to person, place, and time.  Had good Strength in all extremities except right LE  Skin: Skin is warm and dry. No rash noted. No erythema. No pallor.  Psychiatric: She has a normal mood and affect. Her behavior is normal. Judgment and thought content normal.   Labs reviewed:  Recent Labs  07/07/16 0612 07/12/16 0700 07/18/16 0700  NA 134* 133* 138  K 4.0 3.5 3.7  CL 99* 95* 101  CO2 27 28 28   GLUCOSE 98 105* 96  BUN 20 20 22*  CREATININE 0.97 0.94 0.86  CALCIUM 9.5 10.1 9.9    Recent Labs  04/22/16 1132  AST 17  ALT 12*  ALKPHOS 77  BILITOT 0.9  PROT 7.3  ALBUMIN 3.5    Recent Labs  04/22/16 1132  07/07/16 0612 07/12/16 0700 07/18/16 0700  WBC 9.7  < > 11.3*  9.3 7.5  NEUTROABS 6.9  --  7.6  --   --   HGB 11.0*  < > 8.9* 9.1* 7.9*  HCT 32.9*  < > 26.4* 28.1* 24.3*  MCV 80.0  < > 81.5 82.6 81.8  PLT 291  < > 297 387 313  < > = values in this interval not displayed. No results found for: TSH No results found for: HGBA1C No results found for: CHOL, HDL, LDLCALC, LDLDIRECT, TRIG, CHOLHDL  Significant Diagnostic Results in last 30 days:  Dg C-arm 1-60 Min  Result Date: 07/04/2016 CLINICAL DATA:  Osteoarthritis. EXAM: OPERATIVE RIGHT HIP (WITH PELVIS IF PERFORMED) 1 VIEW; RF- DG C-ARM 61-120 min FLUOROSCOPY TIME:  13 SECONDS C-arm fluoroscopic images were obtained intraoperatively and submitted for post operative interpretation. Please see the performing provider's procedural report for the fluoroscopy time utilized. TECHNIQUE: Fluoroscopic spot image(s) were submitted for interpretation post-operatively. COMPARISON:  None. FINDINGS: The acetabular and femoral components of the right total hip prosthesis appear in good position in the AP projection. No fracture. IMPRESSION: Satisfactory appearance of the right hip in the AP projection after total hip prosthesis insertion. Electronically Signed   By: Lorriane Shire M.D.   On: 07/04/2016 10:23   Dg Hip Operative Unilat W Or W/o Pelvis Right  Result Date: 07/04/2016 CLINICAL DATA:  Osteoarthritis. EXAM: OPERATIVE RIGHT HIP (WITH PELVIS IF PERFORMED) 1 VIEW; RF- DG C-ARM 61-120 min FLUOROSCOPY TIME:  13 SECONDS C-arm fluoroscopic images were obtained intraoperatively and submitted for post operative interpretation. Please see the performing provider's procedural report for the fluoroscopy time utilized. TECHNIQUE: Fluoroscopic spot image(s) were submitted for interpretation post-operatively. COMPARISON:  None. FINDINGS: The acetabular and femoral components of the right total hip prosthesis appear in good position in the AP projection. No fracture. IMPRESSION: Satisfactory appearance of the right hip in the AP  projection after total hip prosthesis insertion. Electronically Signed   By: Lorriane Shire M.D.   On: 07/04/2016 10:23    Assessment/Plan  Anemia Her Baseline Hgb according to patient is around 10.5 This drift is most likely due to Surgery. Will do Iron studies and start her in Iron. Guaiac Stool for occult blood.  H/O total hip arthroplasty, right Patient doing well with Pain management. Doing well with Therapy. Follow up with Ortho went well yesterday and per patient she is healing good. Also on aspirin for 2 weeks  for DVT  Disposition home  Family/ staff Communication:   Labs/tests ordered:  CBC and Iron studies.

## 2016-07-20 ENCOUNTER — Encounter: Payer: Self-pay | Admitting: Internal Medicine

## 2016-07-20 NOTE — Progress Notes (Signed)
Location:   [place] Room Number: 045/W Place of Service:  SNF (31)  Provider: Arlo,Lassen  PCP: Sharilyn Sites, MD Patient Care Team: Sharilyn Sites, MD as PCP - General [facility] Medicine)  Extended Emergency Contact Information Primary Emergency Contact: [facility] Address: [address] [place], [place] [address] Johnnette Litter of [place] Phone: [phone] Relation: Daughter Secondary Emergency Contact: Werner Lean, [place] [address] Johnnette Litter of [place] Mobile Phone: [phone] Relation: Son  Code Status: Full Code Goals of care:  Advanced Directive information Advanced Directives 07/20/2016  Does Patient Have a Medical Advance Directive? Yes  Type of Advance Directive (No Data)  Does patient want to make changes to medical advance directive? No - Patient declined  Would patient like information on creating a medical advance directive? No - Patient declined     Allergies  Allergen Reactions  . Penicillins Hives and Itching    Has patient had a PCN reaction causing immediate rash, facial/tongue/throat swelling, SOB or lightheadedness with hypotension: YES Has patient had a PCN reaction causing severe rash involving mucus membranes or skin necrosis: No Has patient had a PCN reaction that required hospitalization No Has patient had a PCN reaction occurring within the last 10 years: No If all of the above answers are "NO", then may proceed with Cephalosporin use.   . Ace Inhibitors Cough  . Aspirin Other (See Comments)    Heart racing (large doses)  . Sulfa Antibiotics Itching and Rash    Chief Complaint  Patient presents with  . Discharge Note    HPI:  76 y.o. female      Past Medical History:  Diagnosis Date  . Anemia   . Arthritis    "qwhere/Dr. Noemi Chapel" (07/04/2016)  . Asthma    "been told I've had it; been told I don't" (07/04/2016)  . Carotid stenosis 06/03/2016   1-39% bilateral ICA  stenosis 05/2016.  . Environmental and seasonal allergies   . Family history of adverse reaction to anesthesia    son may have had sleep apnea had problem waking him up  . GERD (gastroesophageal reflux disease)   . Hypertension   . Murmur 05/06/2016  . Pneumonia 1980s X1  . Primary localized osteoarthritis of right knee     Past Surgical History:  Procedure Laterality Date  . ABDOMINAL HYSTERECTOMY  1985  . CATARACT EXTRACTION W/ INTRAOCULAR LENS  IMPLANT, BILATERAL Bilateral 2002-2007   "left-right"  . COLONOSCOPY    . [place]   "prior to hysterectomy"  . FRACTURE SURGERY    . JOINT REPLACEMENT    . NASAL FRACTURE SURGERY  2016  . TONSILLECTOMY    . 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 TOTAL HIP ARTHROPLASTY Right 07/04/2016   Procedure: TOTAL HIP ARTHROPLASTY ANTERIOR APPROACH;  Surgeon: Frederik Pear, MD;  Location: [place];  Service: Orthopedics;  Laterality: Right;      reports that she has never smoked. She has never used smokeless tobacco. She reports that she does not drink alcohol or use drugs. Social History   Social History  . Marital status: Widowed    Spouse name: N/A  . Number of children: N/A  . Years of education: N/A   Occupational History  . Not on file.   Social History Main Topics  . Smoking status: Never Smoker  . Smokeless tobacco: Never Used  .  Alcohol use No  . Drug use: No  . Sexual activity: No   Other Topics Concern  . Not on file   Social History Narrative  . No narrative on file   Functional Status Survey:    Allergies  Allergen Reactions  . Penicillins Hives and Itching    Has patient had a PCN reaction causing immediate rash, facial/tongue/throat swelling, SOB or lightheadedness with hypotension: YES Has patient had a PCN reaction causing severe rash involving mucus membranes or skin necrosis: No Has patient had a PCN reaction that required hospitalization No Has patient had a PCN reaction  occurring within the last 10 years: No If all of the above answers are "NO", then may proceed with Cephalosporin use.   . Ace Inhibitors Cough  . Aspirin Other (See Comments)    Heart racing (large doses)  . Sulfa Antibiotics Itching and Rash    Pertinent  Health Maintenance Due  Topic Date Due  . PNA vac Low Risk Adult (1 of 2 - PCV13) 01/14/2017 (Originally 06/19/2004)  . INFLUENZA VACCINE  09/14/2016  . DEXA SCAN  Completed    Medications: Outpatient Encounter Prescriptions as of 07/20/2016  Medication Sig  . albuterol (PROVENTIL HFA;VENTOLIN HFA) 108 (90 Base) MCG/ACT inhaler Inhale 2 puffs into the lungs every 6 (six) hours as needed for wheezing or shortness of breath.  [name] amLODipine (NORVASC) 5 MG tablet Take 5 mg by mouth daily.  . ARTIFICIAL TEAR OP Apply 2 drops to eye daily.   [name] aspirin EC 325 MG tablet Take 1 tablet (325 mg total) by mouth 2 (two) times daily.  . Calcium Carbonate-Vitamin D (CALCIUM + D) 600-200 MG-UNIT TABS Take 1 tablet by mouth daily.   . cetirizine (ZYRTEC) 10 MG tablet Take 10 mg by mouth daily.  . Cranberry 450 MG TABS Take 900 mg by mouth daily.  [name] docusate sodium (COLACE) 100 MG capsule Take 100 mg by mouth daily after breakfast.   . ferrous sulfate (KP FERROUS SULFATE) 325 (65 FE) MG tablet Take 325 mg by mouth daily with breakfast.  . hydrochlorothiazide (HYDRODIURIL) 25 MG tablet Take 25 mg by mouth daily.  . Menthol, Topical Analgesic, (BIOFREEZE EX) Apply 1 application topically daily as needed (pain).  . Multiple Vitamins-Minerals (ICAPS MV PO) Take 1 capsule by mouth daily.  [name] oxyCODONE-acetaminophen (ROXICET) 5-325 MG tablet 1 by mouth every 4 hours as needed for pain or 2 by mouth every 4 hours as needed for severe pain DO NOT EXCEED 3GM OF APAP IN 24 HOURS FROM ALL SOURCES  . ranitidine (ZANTAC) 150 MG tablet Take 150 mg by mouth 2 (two) times daily.  . sodium chloride (OCEAN) 0.65 % SOLN nasal spray Place 1 spray into both nostrils as needed  for congestion.  [name] tiZANidine (ZANAFLEX) 2 MG tablet Take 1 tablet (2 mg total) by mouth every 6 (six) hours as needed for muscle spasms.   No facility-administered encounter medications on file as of 07/20/2016.      Review of Systems  Vitals:   07/20/16 1038  BP: 101/65  Pulse: 88  Resp: 16  Temp: 98.7 F (37.1 C)  TempSrc: Oral   There is no height or weight on file to calculate BMI. Physical Exam  Labs reviewed: Basic Metabolic Panel:  Recent Labs  07/07/16 0612 07/12/16 0700 07/18/16 0700  NA 134* 133* 138  K 4.0 3.5 3.7  CL 99* 95* 101  CO2 27 28 28   GLUCOSE 98 105* 96  BUN 20 20 22*  CREATININE 0.97 0.94 0.86  CALCIUM 9.5 10.1 9.9   Liver Function Tests:  Recent Labs  04/22/16 1132  AST 17  ALT 12*  ALKPHOS 77  BILITOT 0.9  PROT 7.3  ALBUMIN 3.5   No results for input(s): LIPASE, AMYLASE in the last 8760 hours. No results for input(s): AMMONIA in the last 8760 hours. CBC:  Recent Labs  04/22/16 1132  07/07/16 0612 07/12/16 0700 07/18/16 0700  WBC 9.7  < > 11.3* 9.3 7.5  NEUTROABS 6.9  --  7.6  --   --   HGB 11.0*  < > 8.9* 9.1* 7.9*  HCT 32.9*  < > 26.4* 28.1* 24.3*  MCV 80.0  < > 81.5 82.6 81.8  PLT 291  < > 297 387 313  < > = values in this interval not displayed. Cardiac Enzymes: No results for input(s): CKTOTAL, CKMB, CKMBINDEX, TROPONINI in the last 8760 hours. BNP: Invalid input(s): POCBNP CBG: No results for input(s): GLUCAP in the last 8760 hours.  Procedures and Imaging Studies During Stay: Dg C-arm 1-60 Min  Result Date: 07/04/2016 CLINICAL DATA:  Osteoarthritis. EXAM: OPERATIVE RIGHT HIP (WITH PELVIS IF PERFORMED) 1 VIEW; RF- DG C-ARM 61-120 min FLUOROSCOPY TIME:  13 SECONDS C-arm fluoroscopic images were obtained intraoperatively and submitted for post operative interpretation. Please see the performing provider's procedural report for the fluoroscopy time utilized. TECHNIQUE: Fluoroscopic spot image(s) were submitted for  interpretation post-operatively. COMPARISON:  None. FINDINGS: The acetabular and femoral components of the right total hip prosthesis appear in good position in the AP projection. No fracture. IMPRESSION: Satisfactory appearance of the right hip in the AP projection after total hip prosthesis insertion. Electronically Signed   By: Lorriane Shire M.D.   On: 07/04/2016 10:23   Dg Hip Operative Unilat W Or W/o Pelvis Right  Result Date: 07/04/2016 CLINICAL DATA:  Osteoarthritis. EXAM: OPERATIVE RIGHT HIP (WITH PELVIS IF PERFORMED) 1 VIEW; RF- DG C-ARM 61-120 min FLUOROSCOPY TIME:  13 SECONDS C-arm fluoroscopic images were obtained intraoperatively and submitted for post operative interpretation. Please see the performing provider's procedural report for the fluoroscopy time utilized. TECHNIQUE: Fluoroscopic spot image(s) were submitted for interpretation post-operatively. COMPARISON:  None. FINDINGS: The acetabular and femoral components of the right total hip prosthesis appear in good position in the AP projection. No fracture. IMPRESSION: Satisfactory appearance of the right hip in the AP projection after total hip prosthesis insertion. Electronically Signed   By: Lorriane Shire M.D.   On: 07/04/2016 10:23    Assessment/Plan:     Future labs/tests needed:      This encounter was created in error - please disregard.

## 2016-07-21 ENCOUNTER — Encounter (HOSPITAL_COMMUNITY)
Admission: RE | Admit: 2016-07-21 | Discharge: 2016-07-21 | Disposition: A | Discharge status: Home / Self Care | Payer: Medicare HMO | Source: Skilled Nursing Facility | Attending: Internal Medicine | Admitting: Internal Medicine

## 2016-07-21 ENCOUNTER — Encounter: Payer: Self-pay | Admitting: Internal Medicine

## 2016-07-21 ENCOUNTER — Non-Acute Institutional Stay (SKILLED_NURSING_FACILITY): Payer: Medicare HMO | Admitting: Internal Medicine

## 2016-07-21 DIAGNOSIS — Z96641 Presence of right artificial hip joint: Secondary | ICD-10-CM | POA: Insufficient documentation

## 2016-07-21 DIAGNOSIS — I10 Essential (primary) hypertension: Secondary | ICD-10-CM | POA: Diagnosis not present

## 2016-07-21 DIAGNOSIS — Z471 Aftercare following joint replacement surgery: Secondary | ICD-10-CM | POA: Insufficient documentation

## 2016-07-21 DIAGNOSIS — D509 Iron deficiency anemia, unspecified: Secondary | ICD-10-CM | POA: Insufficient documentation

## 2016-07-21 DIAGNOSIS — G8929 Other chronic pain: Secondary | ICD-10-CM | POA: Insufficient documentation

## 2016-07-21 DIAGNOSIS — D500 Iron deficiency anemia secondary to blood loss (chronic): Secondary | ICD-10-CM | POA: Diagnosis not present

## 2016-07-21 LAB — CBC WITH DIFFERENTIAL/PLATELET
BASOS PCT: 1 %
Basophils Absolute: 0.1 10*3/uL (ref 0.0–0.1)
Eosinophils Absolute: 0.5 10*3/uL (ref 0.0–0.7)
Eosinophils Relative: 7 %
HEMATOCRIT: 27.5 % — AB (ref 36.0–46.0)
Hemoglobin: 9 g/dL — ABNORMAL LOW (ref 12.0–15.0)
LYMPHS PCT: 31 %
Lymphs Abs: 2.2 10*3/uL (ref 0.7–4.0)
MCH: 26.7 pg (ref 26.0–34.0)
MCHC: 32.7 g/dL (ref 30.0–36.0)
MCV: 81.6 fL (ref 78.0–100.0)
MONO ABS: 0.6 10*3/uL (ref 0.1–1.0)
MONOS PCT: 8 %
NEUTROS ABS: 3.7 10*3/uL (ref 1.7–7.7)
Neutrophils Relative %: 53 %
Platelets: 375 10*3/uL (ref 150–400)
RBC: 3.37 MIL/uL — ABNORMAL LOW (ref 3.87–5.11)
RDW: 13.6 % (ref 11.5–15.5)
WBC: 7.1 10*3/uL (ref 4.0–10.5)

## 2016-07-21 LAB — BASIC METABOLIC PANEL
Anion gap: 10 (ref 5–15)
BUN: 24 mg/dL — AB (ref 6–20)
CALCIUM: 10.1 mg/dL (ref 8.9–10.3)
CO2: 26 mmol/L (ref 22–32)
CREATININE: 0.95 mg/dL (ref 0.44–1.00)
Chloride: 97 mmol/L — ABNORMAL LOW (ref 101–111)
GFR calc Af Amer: >60 mL/min (ref >60)
GFR calc non Af Amer: 56 mL/min — ABNORMAL LOW (ref >60)
Glucose, Bld: 99 mg/dL (ref 65–99)
Potassium: 3.6 mmol/L (ref 3.5–5.1)
Sodium: 133 mmol/L — ABNORMAL LOW (ref 135–145)

## 2016-07-21 LAB — VITAMIN B12: Vitamin B-12: 410 pg/mL (ref 180–914)

## 2016-07-21 LAB — FOLATE: Folate: 21.5 ng/mL (ref >5.9)

## 2016-07-21 LAB — IRON: Iron: 29 ug/dL (ref 28–170)

## 2016-07-21 NOTE — Progress Notes (Signed)
Location:   [place] Room Number: 751/Z Place of Service:  SNF (31)  Provider: Breta Demedeiros,Bunny Kleist  PCP: Sharilyn Sites, MD Patient Care Team: Sharilyn Sites, MD as PCP - General [facility] Medicine)  Extended Emergency Contact Information Primary Emergency Contact: [facility] Address: [address] [place], [place] [address] Johnnette Litter of [place] Phone: [phone] Relation: Daughter Secondary Emergency Contact: Werner Lean, [place] [address] Johnnette Litter of [place] Mobile Phone: [phone] Relation: Son  Code Status: Full Code Goals of care:  Advanced Directive information Advanced Directives 07/21/2016  Does Patient Have a Medical Advance Directive? Yes  Type of Advance Directive (No Data)  Does patient want to make changes to medical advance directive? No - Patient declined  Would patient like information on creating a medical advance directive? No - Patient declined     Allergies  Allergen Reactions  . Penicillins Hives and Itching    Has patient had a PCN reaction causing immediate rash, facial/tongue/throat swelling, SOB or lightheadedness with hypotension: YES Has patient had a PCN reaction causing severe rash involving mucus membranes or skin necrosis: No Has patient had a PCN reaction that required hospitalization No Has patient had a PCN reaction occurring within the last 10 years: No If all of the above answers are "NO", then may proceed with Cephalosporin use.   . Ace Inhibitors Cough  . Aspirin Other (See Comments)    Heart racing (large doses)  . Sulfa Antibiotics Itching and Rash    Chief Complaint  Patient presents with  . Discharge Note    HPI:  76 y.o. female  seen today for discharge from facility later this week.  She's been here for rehabilitation after undergoing a right total hip arthroplasty on 07/04/2016.  Apparently she tolerated the procedure well and there have not really been  any postop complications to my knowledge-it was noted her hemoglobin had dropped a bit down to 7.9 she has been started on iron and hemoglobin today shows improvement at 9.0-apparently her baseline hemoglobin is slightly above 10-this is thought to be most likely postop anemia-occult blood testing has been ordered but so far no samples have been obtained it appears.  Nonetheless her hemoglobin appears to be trending up.  Her other medical issues appear to be stable she does have a history of hypertension she is on Norvasc as well as hydrochlorothiazide recent blood pressures 101/65-109/61-120/58.  She also has a history of allergic rhinitis is on Zyrtec apparently this is been relatively well controlled during her stay here.  Regards to her hip replacement she's completed her aspirin prophylaxis for DVT-she does receive oxycodone as needed for pain as well as Zanaflex.  She will need follow-up she is thought to be doing well in this regards.  She will be going home she will be with her daughter for a while of that this-- she would benefit from PT   Next orthopedic appointment is July 3.  Currently she is weightbearing as tolerated.  She has no complaints she is looking forward to going home she will be home with her daughter for a while.  I note she had been on high-dose aspirin for DVT prophylaxis currently a do not see aspirin listed she had been on a home dose of 81 mg a day and we will restart this she has completed  the 2 week course of high-dose aspirin  Past Medical History:  Diagnosis Date  . Anemia   . Arthritis    "qwhere/Dr. Noemi Chapel" (07/04/2016)  . Asthma    "been told I've had it; been told I don't" (07/04/2016)  . Carotid stenosis 06/03/2016   1-39% bilateral ICA stenosis 05/2016.  . Environmental and seasonal allergies   . Family history of adverse reaction to anesthesia    son may have had sleep apnea had problem waking him up  . GERD (gastroesophageal reflux  disease)   . Hypertension   . Murmur 05/06/2016  . Pneumonia 1980s X1  . Primary localized osteoarthritis of right knee     Past Surgical History:  Procedure Laterality Date  . ABDOMINAL HYSTERECTOMY  1985  . CATARACT EXTRACTION W/ INTRAOCULAR LENS  IMPLANT, BILATERAL Bilateral 2002-2007   "left-right"  . COLONOSCOPY    . [place]   "prior to hysterectomy"  . FRACTURE SURGERY    . JOINT REPLACEMENT    . NASAL FRACTURE SURGERY  2016  . TONSILLECTOMY    . 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 TOTAL HIP ARTHROPLASTY Right 07/04/2016   Procedure: TOTAL HIP ARTHROPLASTY ANTERIOR APPROACH;  Surgeon: Frederik Pear, MD;  Location: [place];  Service: Orthopedics;  Laterality: Right;      reports that she has never smoked. She has never used smokeless tobacco. She reports that she does not drink alcohol or use drugs. Social History   Social History  . Marital status: Widowed    Spouse name: N/A  . Number of children: N/A  . Years of education: N/A   Occupational History  . Not on file.   Social History Main Topics  . Smoking status: Never Smoker  . Smokeless tobacco: Never Used  . Alcohol use No  . Drug use: No  . Sexual activity: No   Other Topics Concern  . Not on file   Social History Narrative  . No narrative on file   Functional Status Survey:    Allergies  Allergen Reactions  . Penicillins Hives and Itching    Has patient had a PCN reaction causing immediate rash, facial/tongue/throat swelling, SOB or lightheadedness with hypotension: YES Has patient had a PCN reaction causing severe rash involving mucus membranes or skin necrosis: No Has patient had a PCN reaction that required hospitalization No Has patient had a PCN reaction occurring within the last 10 years: No If all of the above answers are "NO", then may proceed with Cephalosporin use.   . Ace Inhibitors Cough  . Aspirin Other (See Comments)    Heart racing (large doses)   . Sulfa Antibiotics Itching and Rash    Pertinent  Health Maintenance Due  Topic Date Due  . PNA vac Low Risk Adult (1 of 2 - PCV13) 01/14/2017 (Originally 06/19/2004)  . INFLUENZA VACCINE  09/14/2016  . DEXA SCAN  Completed    Medications: Outpatient Encounter Prescriptions as of 07/21/2016  Medication Sig  . albuterol (PROVENTIL HFA;VENTOLIN HFA) 108 (90 Base) MCG/ACT inhaler Inhale 2 puffs into the lungs every 6 (six) hours as needed for wheezing or shortness of breath.  [name] amLODipine (NORVASC) 5 MG tablet Take 5 mg by mouth daily.  . ARTIFICIAL TEAR OP Apply 2 drops to eye daily.   . Calcium Carbonate-Vitamin D (CALCIUM + D) 600-200 MG-UNIT TABS Take 1 tablet by mouth daily.   . cetirizine (ZYRTEC) 10 MG tablet Take 10 mg by mouth daily.  . Cranberry 450 MG TABS Take  900 mg by mouth daily.  [name] docusate sodium (COLACE) 100 MG capsule Take 100 mg by mouth daily after breakfast.   . ferrous sulfate (KP FERROUS SULFATE) 325 (65 FE) MG tablet Take 325 mg by mouth daily with breakfast.  . hydrochlorothiazide (HYDRODIURIL) 25 MG tablet Take 25 mg by mouth daily.  . Menthol, Topical Analgesic, (BIOFREEZE EX) Apply 1 application topically daily as needed (pain).  . Multiple Vitamins-Minerals (ICAPS MV PO) Take 1 capsule by mouth daily.  [name] oxyCODONE-acetaminophen (ROXICET) 5-325 MG tablet 1 by mouth every 4 hours as needed for pain or 2 by mouth every 4 hours as needed for severe pain DO NOT EXCEED 3GM OF APAP IN 24 HOURS FROM ALL SOURCES  . ranitidine (ZANTAC) 150 MG tablet Take 150 mg by mouth 2 (two) times daily.  . sodium chloride (OCEAN) 0.65 % SOLN nasal spray Place 1 spray into both nostrils as needed for congestion.  [name] tiZANidine (ZANAFLEX) 2 MG tablet Take 1 tablet (2 mg total) by mouth every 6 (six) hours as needed for muscle spasms.  . [DISCONTINUED] aspirin EC 325 MG tablet Take 1 tablet (325 mg total) by mouth 2 (two) times daily.   No facility-administered encounter medications on  file as of 07/21/2016.      Review of Systems   General is not complaining of any fever or chills.  Skin does not complain of rashes or itching surgical site appears to be healed unremarkably.  Head ears eyes nose mouth and throat does not complaining any visual changes or sore throat.  #. Denies shortness breath or cough.  Cardiac denies chest pain has some right lower extremity edema but this appears essentially resolved.  GI does not complaining be nausea vomiting diarrhea constipation or abdominal discomfort.  Muscle skeletal still has some hip pain but oxycodone apparently is effective for his.  Now is ambulating with a walker and weightbearing as tolerated.  Neurologic is not complaining of dizziness headache or numbness or syncope.   Psych does not complain of any depression or anxiety continues to be in good spirits [place] nicely  Vitals:   07/21/16 1355  BP: 101/65  Pulse: 88  Resp: 16  Temp: 98.7 F (37.1 C)  TempSrc: Oral  SpO2: 98%    Physical Exam   In general this is a very pleasant elderly female in no distress.  Her skin is warm and dry eyes surgical scar on right hip appears healing unremarkably there is no drainage bleeding or surrounding erythema or sign of infection.  Eyes has prescription lenses pupils appear reactive to light visual acuity appears intact.  Oropharynx is clear mucous membranes moist.  Heart is regular rate and rhythm with occasional irregular beats she has minimal lower extremity edema pedal pulses are palpable bilaterally.  Abdomen is soft nontender with positive bowel sounds.  Musculoskeletal is able to stand without assistance and use her walker still has a small bit of weakness but appears to be doing well --uppe rr extremity strength appears preserved.  Neurologic is grossly intact her speech is clear no lateralizing findings  Psych she is alert and oriented pleasant and appropriate.      Labs  reviewed: Basic Metabolic Panel:  Recent Labs  07/12/16 0700 07/18/16 0700 07/21/16 0706  NA 133* 138 133*  K 3.5 3.7 3.6  CL 95* 101 97*  CO2 28 28 26   GLUCOSE 105* 96 99  BUN 20 22* 24*  CREATININE 0.94 0.86 0.95  CALCIUM 10.1  9.9 10.1   Liver Function Tests:  Recent Labs  04/22/16 1132  AST 17  ALT 12*  ALKPHOS 77  BILITOT 0.9  PROT 7.3  ALBUMIN 3.5   No results for input(s): LIPASE, AMYLASE in the last 8760 hours. No results for input(s): AMMONIA in the last 8760 hours. CBC:  Recent Labs  04/22/16 1132  07/07/16 0612 07/12/16 0700 07/18/16 0700 07/21/16 0706  WBC 9.7  < > 11.3* 9.3 7.5 7.1  NEUTROABS 6.9  --  7.6  --   --  3.7  HGB 11.0*  < > 8.9* 9.1* 7.9* 9.0*  HCT 32.9*  < > 26.4* 28.1* 24.3* 27.5*  MCV 80.0  < > 81.5 82.6 81.8 81.6  PLT 291  < > 297 387 313 375  < > = values in this interval not displayed. Cardiac Enzymes: No results for input(s): CKTOTAL, CKMB, CKMBINDEX, TROPONINI in the last 8760 hours. BNP: Invalid input(s): POCBNP CBG: No results for input(s): GLUCAP in the last 8760 hours.  Procedures and Imaging Studies During Stay: Dg C-arm 1-60 Min  Result Date: 07/04/2016 CLINICAL DATA:  Osteoarthritis. EXAM: OPERATIVE RIGHT HIP (WITH PELVIS IF PERFORMED) 1 VIEW; RF- DG C-ARM 61-120 min FLUOROSCOPY TIME:  13 SECONDS C-arm fluoroscopic images were obtained intraoperatively and submitted for post operative interpretation. Please see the performing provider's procedural report for the fluoroscopy time utilized. TECHNIQUE: Fluoroscopic spot image(s) were submitted for interpretation post-operatively. COMPARISON:  None. FINDINGS: The acetabular and femoral components of the right total hip prosthesis appear in good position in the AP projection. No fracture. IMPRESSION: Satisfactory appearance of the right hip in the AP projection after total hip prosthesis insertion. Electronically Signed   By: Lorriane Shire M.D.   On: 07/04/2016 10:23   Dg  Hip Operative Unilat W Or W/o Pelvis Right  Result Date: 07/04/2016 CLINICAL DATA:  Osteoarthritis. EXAM: OPERATIVE RIGHT HIP (WITH PELVIS IF PERFORMED) 1 VIEW; RF- DG C-ARM 61-120 min FLUOROSCOPY TIME:  13 SECONDS C-arm fluoroscopic images were obtained intraoperatively and submitted for post operative interpretation. Please see the performing provider's procedural report for the fluoroscopy time utilized. TECHNIQUE: Fluoroscopic spot image(s) were submitted for interpretation post-operatively. COMPARISON:  None. FINDINGS: The acetabular and femoral components of the right total hip prosthesis appear in good position in the AP projection. No fracture. IMPRESSION: Satisfactory appearance of the right hip in the AP projection after total hip prosthesis insertion. Electronically Signed   By: Lorriane Shire M.D.   On: 07/04/2016 10:23    Assessment/Plan:    #1 history of right hip replacement that she appears to have tolerated this well is receiving oxycodone as needed for pain also has Zanaflex when necessary she has orthopedic follow-up scheduled she has been made weightbearing as tolerated will need PT and OT at home.  #2 anemia most likely postop she has been started on iron hemoglobin is drifting up which is encouraging she did have an iron study done that was 28 B12 was normal at 410 and folate is pending.  Again this appears to be stabilized will need follow-up by primary care provider at some point.  #3 history hypertension as stated above this is stable on Norvasc and hydrochlorothiazide metabolic panel today showed relative stability sodium is minimally low at 133.  #4-history of allergic rhinitis this has been stable on Zyrtec.  #5 minimal hyponatremia appear she has some history of this is asymptomatic will warm follow-up by primary care provider.  Of note I also will restart  the home dose of Aspirin  she was on a 81 mg a day    Again she will be going home she  We will be with her  daughtr for a whil does have PT and OT appears to be doing well will need primary care provider follow-up orthopedic follow-up has also been arranged July 3.   Of note I have spoken with the discharge note apparently she will be going home with her daughter in [place] order a CBC by home health next week as well as a metabolic panel to keep an eye on her electrolytes as well as hemoglobin with follow-up by her healthcare provider       [id number]  greater than 30 minutes spent on this discharge summary-greater than 50 times coordinating plan of care

## 2016-08-08 DIAGNOSIS — R269 Unspecified abnormalities of gait and mobility: Secondary | ICD-10-CM | POA: Diagnosis not present

## 2016-08-08 DIAGNOSIS — M25551 Pain in right hip: Secondary | ICD-10-CM | POA: Diagnosis not present

## 2016-08-08 DIAGNOSIS — Z96642 Presence of left artificial hip joint: Secondary | ICD-10-CM | POA: Diagnosis not present

## 2016-08-18 DIAGNOSIS — Z96651 Presence of right artificial knee joint: Secondary | ICD-10-CM | POA: Diagnosis not present

## 2016-08-18 DIAGNOSIS — M1611 Unilateral primary osteoarthritis, right hip: Secondary | ICD-10-CM | POA: Diagnosis not present

## 2016-08-18 DIAGNOSIS — Z09 Encounter for follow-up examination after completed treatment for conditions other than malignant neoplasm: Secondary | ICD-10-CM | POA: Diagnosis not present

## 2016-08-30 DIAGNOSIS — H524 Presbyopia: Secondary | ICD-10-CM | POA: Diagnosis not present

## 2016-08-30 DIAGNOSIS — H353131 Nonexudative age-related macular degeneration, bilateral, early dry stage: Secondary | ICD-10-CM | POA: Diagnosis not present

## 2016-08-30 DIAGNOSIS — H52203 Unspecified astigmatism, bilateral: Secondary | ICD-10-CM | POA: Diagnosis not present

## 2016-08-30 DIAGNOSIS — Z961 Presence of intraocular lens: Secondary | ICD-10-CM | POA: Diagnosis not present

## 2016-09-05 DIAGNOSIS — Z6826 Body mass index (BMI) 26.0-26.9, adult: Secondary | ICD-10-CM | POA: Diagnosis not present

## 2016-09-05 DIAGNOSIS — Z1389 Encounter for screening for other disorder: Secondary | ICD-10-CM | POA: Diagnosis not present

## 2016-09-05 DIAGNOSIS — M810 Age-related osteoporosis without current pathological fracture: Secondary | ICD-10-CM | POA: Diagnosis not present

## 2016-09-05 DIAGNOSIS — I10 Essential (primary) hypertension: Secondary | ICD-10-CM | POA: Diagnosis not present

## 2016-09-26 DIAGNOSIS — L570 Actinic keratosis: Secondary | ICD-10-CM | POA: Diagnosis not present

## 2016-09-26 DIAGNOSIS — X32XXXD Exposure to sunlight, subsequent encounter: Secondary | ICD-10-CM | POA: Diagnosis not present

## 2016-09-26 DIAGNOSIS — C44311 Basal cell carcinoma of skin of nose: Secondary | ICD-10-CM | POA: Diagnosis not present

## 2016-10-10 ENCOUNTER — Other Ambulatory Visit (HOSPITAL_COMMUNITY): Payer: Self-pay | Admitting: Family Medicine

## 2016-10-10 DIAGNOSIS — Z1231 Encounter for screening mammogram for malignant neoplasm of breast: Secondary | ICD-10-CM

## 2016-11-02 ENCOUNTER — Other Ambulatory Visit (HOSPITAL_COMMUNITY): Payer: Self-pay | Admitting: Family Medicine

## 2016-11-02 ENCOUNTER — Ambulatory Visit (HOSPITAL_COMMUNITY)
Admission: RE | Admit: 2016-11-02 | Discharge: 2016-11-02 | Disposition: A | Discharge status: Home / Self Care | Payer: Medicare HMO | Source: Ambulatory Visit | Attending: Family Medicine | Admitting: Family Medicine

## 2016-11-02 DIAGNOSIS — Z1231 Encounter for screening mammogram for malignant neoplasm of breast: Secondary | ICD-10-CM | POA: Insufficient documentation

## 2016-11-08 DIAGNOSIS — X32XXXD Exposure to sunlight, subsequent encounter: Secondary | ICD-10-CM | POA: Diagnosis not present

## 2016-11-08 DIAGNOSIS — L570 Actinic keratosis: Secondary | ICD-10-CM | POA: Diagnosis not present

## 2016-11-16 DIAGNOSIS — C44311 Basal cell carcinoma of skin of nose: Secondary | ICD-10-CM | POA: Diagnosis not present

## 2016-12-07 DIAGNOSIS — C44311 Basal cell carcinoma of skin of nose: Secondary | ICD-10-CM | POA: Diagnosis not present

## 2016-12-13 DIAGNOSIS — R69 Illness, unspecified: Secondary | ICD-10-CM | POA: Diagnosis not present

## 2016-12-15 DIAGNOSIS — M1611 Unilateral primary osteoarthritis, right hip: Secondary | ICD-10-CM | POA: Diagnosis not present

## 2016-12-15 DIAGNOSIS — Z96641 Presence of right artificial hip joint: Secondary | ICD-10-CM | POA: Diagnosis not present

## 2017-03-06 DIAGNOSIS — L570 Actinic keratosis: Secondary | ICD-10-CM | POA: Diagnosis not present

## 2017-03-06 DIAGNOSIS — X32XXXD Exposure to sunlight, subsequent encounter: Secondary | ICD-10-CM | POA: Diagnosis not present

## 2017-03-30 DIAGNOSIS — M1711 Unilateral primary osteoarthritis, right knee: Secondary | ICD-10-CM | POA: Diagnosis not present

## 2017-04-05 DIAGNOSIS — L570 Actinic keratosis: Secondary | ICD-10-CM | POA: Diagnosis not present

## 2017-04-05 DIAGNOSIS — X32XXXD Exposure to sunlight, subsequent encounter: Secondary | ICD-10-CM | POA: Diagnosis not present

## 2017-04-05 DIAGNOSIS — C44629 Squamous cell carcinoma of skin of left upper limb, including shoulder: Secondary | ICD-10-CM | POA: Diagnosis not present

## 2017-04-11 ENCOUNTER — Other Ambulatory Visit: Payer: Self-pay | Admitting: Orthopedic Surgery

## 2017-04-13 DIAGNOSIS — E663 Overweight: Secondary | ICD-10-CM | POA: Diagnosis not present

## 2017-04-13 DIAGNOSIS — Z6828 Body mass index (BMI) 28.0-28.9, adult: Secondary | ICD-10-CM | POA: Diagnosis not present

## 2017-04-13 DIAGNOSIS — J069 Acute upper respiratory infection, unspecified: Secondary | ICD-10-CM | POA: Diagnosis not present

## 2017-04-13 DIAGNOSIS — Z1389 Encounter for screening for other disorder: Secondary | ICD-10-CM | POA: Diagnosis not present

## 2017-04-14 ENCOUNTER — Other Ambulatory Visit: Payer: Self-pay | Admitting: Orthopedic Surgery

## 2017-04-28 NOTE — Pre-Procedure Instructions (Signed)
JOLIET MALLOZZI  04/28/2017      [id number] - [place], [place] - [address] [place] [address] Phone: [phone] Fax: [phone]    Your procedure is scheduled on May 10, 2017.  Report to [facility] Admitting at 1045 AM.  Call this number if you have problems the morning of surgery:  [phone]   Remember:  Do not eat food or drink liquids after midnight.  Take these medicines the morning of surgery with A SIP OF WATER albuterol inhaler, amlodipine (norvasc), eye drops, cetirizine (zyrtec), ranitidine (zantac), ocean nasal spray-if needed.   Do not wear jewelry, make-up or nail polish.  Do not wear lotions, powders, or perfumes, or deodorant.  Do not shave 48 hours prior to surgery.    Do not bring valuables to the hospital.  [facility] is not responsible for any belongings or valuables.  Contacts, dentures or bridgework may not be worn into surgery.  Leave your suitcase in the car.  After surgery it may be brought to your room.  For patients admitted to the hospital, discharge time will be determined by your treatment team.  Patients discharged the day of surgery will not be allowed to drive home.   [place]- Preparing For Surgery  Before surgery, you can play an important role. Because skin is not sterile, your skin needs to be as free of germs as possible. You can reduce the number of germs on your skin by washing with CHG (chlorahexidine gluconate) Soap before surgery.  CHG is an antiseptic cleaner which kills germs and bonds with the skin to continue killing germs even after washing.  Please do not use if you have an allergy to CHG or antibacterial soaps. If your skin becomes reddened/irritated stop using the CHG.  Do not shave (including legs and underarms) for at least 48 hours prior to first CHG shower. It is OK to shave your face.  Please follow these instructions carefully.   1. Shower the  NIGHT BEFORE SURGERY and the MORNING OF SURGERY with CHG.   2. If you chose to wash your hair, wash your hair first as usual with your normal shampoo.  3. After you shampoo, rinse your hair and body thoroughly to remove the shampoo.  4. Use CHG as you would any other liquid soap. You can apply CHG directly to the skin and wash gently with a scrungie or a clean washcloth.   5. Apply the CHG Soap to your body ONLY FROM THE NECK DOWN.  Do not use on open wounds or open sores. Avoid contact with your eyes, ears, mouth and genitals (private parts). Wash Face and genitals (private parts)  with your normal soap.  6. Wash thoroughly, paying special attention to the area where your surgery will be performed.  7. Thoroughly rinse your body with warm water from the neck down.  8. DO NOT shower/wash with your normal soap after using and rinsing off the CHG Soap.  9. Pat yourself dry with a CLEAN TOWEL.  10. Wear CLEAN PAJAMAS to bed the night before surgery, wear comfortable clothes the morning of surgery  11. Place CLEAN SHEETS on your bed the night of your first shower and DO NOT SLEEP WITH PETS.    Day of Surgery: Do not apply any deodorants/lotions. Please wear clean clothes to the hospital/surgery center.     Please read over the following fact sheets that you were given.  Pain Booklet, Coughing and Deep Breathing, MRSA Information and Surgical Site Infection Prevention

## 2017-05-01 ENCOUNTER — Encounter (HOSPITAL_COMMUNITY): Payer: Self-pay

## 2017-05-01 ENCOUNTER — Ambulatory Visit (HOSPITAL_COMMUNITY)
Admission: RE | Admit: 2017-05-01 | Discharge: 2017-05-01 | Disposition: A | Discharge status: Home / Self Care | Payer: Medicare HMO | Source: Ambulatory Visit | Attending: Orthopedic Surgery | Admitting: Orthopedic Surgery

## 2017-05-01 ENCOUNTER — Encounter (HOSPITAL_COMMUNITY)
Admission: RE | Admit: 2017-05-01 | Discharge: 2017-05-01 | Disposition: A | Discharge status: Home / Self Care | Payer: Medicare HMO | Source: Ambulatory Visit | Attending: Orthopedic Surgery | Admitting: Orthopedic Surgery

## 2017-05-01 DIAGNOSIS — I700 Atherosclerosis of aorta: Secondary | ICD-10-CM | POA: Insufficient documentation

## 2017-05-01 DIAGNOSIS — R9431 Abnormal electrocardiogram [ECG] [EKG]: Secondary | ICD-10-CM | POA: Diagnosis not present

## 2017-05-01 DIAGNOSIS — M4854XA Collapsed vertebra, not elsewhere classified, thoracic region, initial encounter for fracture: Secondary | ICD-10-CM | POA: Diagnosis not present

## 2017-05-01 DIAGNOSIS — Z01818 Encounter for other preprocedural examination: Secondary | ICD-10-CM

## 2017-05-01 DIAGNOSIS — Z01810 Encounter for preprocedural cardiovascular examination: Secondary | ICD-10-CM | POA: Insufficient documentation

## 2017-05-01 DIAGNOSIS — M1711 Unilateral primary osteoarthritis, right knee: Secondary | ICD-10-CM | POA: Diagnosis not present

## 2017-05-01 DIAGNOSIS — R918 Other nonspecific abnormal finding of lung field: Secondary | ICD-10-CM | POA: Diagnosis not present

## 2017-05-01 DIAGNOSIS — Z01812 Encounter for preprocedural laboratory examination: Secondary | ICD-10-CM | POA: Diagnosis not present

## 2017-05-01 LAB — CBC WITH DIFFERENTIAL/PLATELET
Basophils Absolute: 0 10*3/uL (ref 0.0–0.1)
Basophils Relative: 0 %
Eosinophils Absolute: 0.2 10*3/uL (ref 0.0–0.7)
Eosinophils Relative: 3 %
HEMATOCRIT: 34.7 % — AB (ref 36.0–46.0)
HEMOGLOBIN: 11.2 g/dL — AB (ref 12.0–15.0)
LYMPHS ABS: 2.6 10*3/uL (ref 0.7–4.0)
LYMPHS PCT: 37 %
MCH: 27.2 pg (ref 26.0–34.0)
MCHC: 32.3 g/dL (ref 30.0–36.0)
MCV: 84.2 fL (ref 78.0–100.0)
Monocytes Absolute: 0.4 10*3/uL (ref 0.1–1.0)
Monocytes Relative: 6 %
NEUTROS PCT: 54 %
Neutro Abs: 3.8 10*3/uL (ref 1.7–7.7)
Platelets: 254 10*3/uL (ref 150–400)
RBC: 4.12 MIL/uL (ref 3.87–5.11)
RDW: 13.7 % (ref 11.5–15.5)
WBC: 6.9 10*3/uL (ref 4.0–10.5)

## 2017-05-01 LAB — BASIC METABOLIC PANEL
Anion gap: 10 (ref 5–15)
BUN: 15 mg/dL (ref 6–20)
CHLORIDE: 96 mmol/L — AB (ref 101–111)
CO2: 28 mmol/L (ref 22–32)
Calcium: 10.5 mg/dL — ABNORMAL HIGH (ref 8.9–10.3)
Creatinine, Ser: 0.88 mg/dL (ref 0.44–1.00)
GFR calc non Af Amer: >60 mL/min (ref >60)
Glucose, Bld: 85 mg/dL (ref 65–99)
POTASSIUM: 3.7 mmol/L (ref 3.5–5.1)
SODIUM: 134 mmol/L — AB (ref 135–145)

## 2017-05-01 LAB — URINALYSIS, ROUTINE W REFLEX MICROSCOPIC
Bilirubin Urine: NEGATIVE
GLUCOSE, UA: NEGATIVE mg/dL
Ketones, ur: NEGATIVE mg/dL
NITRITE: NEGATIVE
Protein, ur: NEGATIVE mg/dL
SPECIFIC GRAVITY, URINE: 1.006 (ref 1.005–1.030)
pH: 7 (ref 5.0–8.0)

## 2017-05-01 LAB — TYPE AND SCREEN
ABO/RH(D): O POS
ANTIBODY SCREEN: NEGATIVE

## 2017-05-01 LAB — PROTIME-INR
INR: 1.02
Prothrombin Time: 13.3 seconds (ref 11.4–15.2)

## 2017-05-01 LAB — SURGICAL PCR SCREEN
MRSA, PCR: NEGATIVE
Staphylococcus aureus: NEGATIVE

## 2017-05-01 LAB — APTT: aPTT: 31 seconds (ref 24–36)

## 2017-05-02 NOTE — Progress Notes (Addendum)
Anesthesia Chart Review:  Pt is a 77 year old female scheduled for R total knee arthroplasty on 05/10/2017 with Frederik Pear, MD  - PCP is Sharilyn Sites, MD - Cardiologist is Skeet Latch, MD who follows pt for HTN, mild carotid stenosis. Last office visit 06/03/16. 1 year f/u recommended.   PMH includes:  HTN, carotid stenosis, asthma, anemia, GERD. Never smoker. BMI 29. S/p R THA 07/04/16.   Medications include: Albuterol, amlodipine, iron, HCTZ, Zantac  BP (!) 153/73   Pulse 75   Temp 36.7 C   Resp 20   Ht 5\' 4"  (1.626 m)   Wt 170 lb 6.4 oz (77.3 kg)   SpO2 100%   BMI 29.25 kg/m   Preoperative labs reviewed.   - UA consistent with UTI. I left voicemail for [facility] in Dr. Damita Dunnings office about UA results.   CXR 05/01/17:  - There is no active cardiopulmonary disease. - Thoracic aortic atherosclerosis. Chronic 50% anterior compression fracture of T4.  EKG 05/01/17: Sinus rhythm with Premature supraventricular complexes. Septal infarct, age undetermined. Borderline LVH. Appears stable when compared to prior EKG 05/06/16  Nuclear stress test 05/25/16:   The left ventricular ejection fraction is hyperdynamic (>65%).  Nuclear stress EF: 75%.  There was no ST segment deviation noted during stress.  No T wave inversion was noted during stress.  The study is normal.  This is a low risk study.  Echo 05/25/16:  - Left ventricle: The cavity size was normal. There was mild concentric hypertrophy. Systolic function was normal. The estimated ejection fraction was in the range of 55% to 60%. Wall motion was normal; there were no regional wall motion abnormalities. Doppler parameters are consistent with abnormal left ventricular relaxation (grade 1 diastolic dysfunction). - Aorta: Ascending aortic diameter: 39 mm (S). - Ascending aorta: The ascending aorta was mildly dilated. - Mitral valve: There was trivial regurgitation  Carotid duplex 05/25/16:  - Heterogeneous plaque, bilaterally. -  Tortuous CCA and mid and distal ICA, bilaterally. - 1-39% bilateral ICA stenosis. - Normal subclavian arteries, bilaterally. - Patent vertebral arteries with antegrade flow.  If no changes, I anticipate pt can proceed with surgery as scheduled.   Willeen Cass, FNP-BC [facility] Short Stay Surgical Center/Anesthesiology Phone: [phone] 05/02/2017 1:02 PM

## 2017-05-03 NOTE — H&P (Signed)
TOTAL KNEE ADMISSION H&P  Patient is being admitted for right total knee arthroplasty.  Subjective:  Chief Complaint:right knee pain.  HPI: Bailey Wilson, 77 y.o. female, has a history of pain and functional disability in the right knee due to arthritis and has failed non-surgical conservative treatments for greater than 12 weeks to includeNSAID's and/or analgesics, corticosteriod injections, flexibility and strengthening excercises, use of assistive devices and activity modification.  Onset of symptoms was gradual, starting 2 years ago with gradually worsening course since that time. The patient noted no past surgery on the right knee(s).  Patient currently rates pain in the right knee(s) at 10 out of 10 with activity. Patient has night pain, worsening of pain with activity and weight bearing, pain that interferes with activities of daily living and crepitus.  Patient has evidence of subchondral sclerosis and joint space narrowing by imaging studies. There is no active infection.  Patient Active Problem List   Diagnosis Date Noted  . 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 Arthritis of right hip 07/04/2016  . Carotid stenosis 06/03/2016  . Murmur 05/06/2016  . Essential hypertension 04/20/2016  . Environmental and seasonal allergies   . Primary localized osteoarthritis of right knee    Past Medical History:  Diagnosis Date  . Anemia   . Arthritis    "qwhere/Dr. Noemi Chapel" (07/04/2016)  . Asthma    "been told I've had it; been told I don't" (07/04/2016)  . Carotid stenosis 06/03/2016   1-39% bilateral ICA stenosis 05/2016.  . Environmental and seasonal allergies   . Family history of adverse reaction to anesthesia    son may have had sleep apnea had problem waking him up  . GERD (gastroesophageal reflux disease)   . Hypertension   . Murmur 05/06/2016  . Pneumonia 1980s X1  . Primary localized osteoarthritis of right knee     Past Surgical History:  Procedure Laterality Date   . ABDOMINAL HYSTERECTOMY  1985  . CATARACT EXTRACTION W/ INTRAOCULAR LENS  IMPLANT, BILATERAL Bilateral 2002-2007   "left-right"  . COLONOSCOPY    . [place]   "prior to hysterectomy"  . FRACTURE SURGERY    . JOINT REPLACEMENT    . NASAL FRACTURE SURGERY  2016  . TONSILLECTOMY    . 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 TOTAL HIP ARTHROPLASTY Right 07/04/2016   Procedure: TOTAL HIP ARTHROPLASTY ANTERIOR APPROACH;  Surgeon: Frederik Pear, MD;  Location: [facility];  Service: Orthopedics;  Laterality: Right;    No current facility-administered medications for this encounter.    Current Outpatient Medications  Medication Sig Dispense Refill Last Dose  . acetaminophen (TYLENOL) 500 MG tablet Take 500 mg by mouth 2 (two) times daily as needed for moderate pain or headache.     . albuterol (PROVENTIL HFA;VENTOLIN HFA) 108 (90 Base) MCG/ACT inhaler Inhale 2 puffs into the lungs every 6 (six) hours as needed for wheezing or shortness of breath.   Taking  . amLODipine (NORVASC) 5 MG tablet Take 5 mg by mouth daily.   Taking  . ARTIFICIAL TEAR OP Apply 2 drops to eye daily.    Taking  . AZO-CRANBERRY PO Take 2 tablets by mouth daily.     . cetirizine (ZYRTEC) 10 MG tablet Take 10 mg by mouth daily.   Taking  . docusate sodium (COLACE) 100 MG capsule Take 100 mg by mouth daily as needed for mild constipation.    Taking  . ferrous sulfate (KP  FERROUS SULFATE) 325 (65 FE) MG tablet Take 325 mg by mouth daily as needed (low iron).    Taking  . hydrochlorothiazide (HYDRODIURIL) 25 MG tablet Take 25 mg by mouth daily.   Taking  . Multiple Vitamins-Minerals (ICAPS MV PO) Take 1 capsule by mouth daily.   Taking  . ranitidine (ZANTAC) 150 MG tablet Take 150 mg by mouth 2 (two) times daily.   Taking  . sodium chloride (OCEAN) 0.65 % SOLN nasal spray Place 1 spray into both nostrils as needed for congestion.   Taking   Allergies  Allergen Reactions  . Penicillins Hives and  Itching    Has patient had a PCN reaction causing immediate rash, facial/tongue/throat swelling, SOB or lightheadedness with hypotension: YES Has patient had a PCN reaction causing severe rash involving mucus membranes or skin necrosis: No Has patient had a PCN reaction that required hospitalization No Has patient had a PCN reaction occurring within the last 10 years: No If all of the above answers are "NO", then may proceed with Cephalosporin use.   . Ace Inhibitors Cough  . Aspirin Other (See Comments)    Heart racing (large doses)  . Sulfa Antibiotics Itching and Rash    Social History   Tobacco Use  . Smoking status: Never Smoker  . Smokeless tobacco: Never Used  Substance Use Topics  . Alcohol use: No    Family History  Problem Relation Age of Onset  . Asthma Mother   . Arrhythmia Mother   . Heart disease Mother   . Heart disease Father   . Clotting disorder Father   . Hypertension Paternal Grandfather   . Blindness Paternal Grandmother      Review of Systems  Constitutional: Negative.   HENT: Negative.   Eyes: Negative.   Respiratory: Negative.   Cardiovascular: Negative.   Gastrointestinal: Positive for nausea.       Poor appetite  Genitourinary: Negative.   Musculoskeletal: Positive for joint pain and myalgias.  Skin: Negative.   Neurological: Positive for seizures.  Endo/Heme/Allergies: Positive for polydipsia.  Psychiatric/Behavioral: Negative.     Objective:  Physical Exam  Constitutional: She is oriented to person, place, and time. She appears well-developed and well-nourished.  HENT:  Head: Normocephalic and atraumatic.  Eyes: Pupils are equal, round, and reactive to light.  Neck: Normal range of motion. Neck supple.  Cardiovascular: Intact distal pulses.  Respiratory: Effort normal.  Musculoskeletal: She exhibits tenderness and deformity.  the patient has a range from 5 to 105.  Tenderness over the lateral joint line.  Obvious valgus  deformity.  Obvious crepitus with range of motion.  No instability.  Calves are soft and nontender.  She is neurovascularly intact distally.  Neurological: She is alert and oriented to person, place, and time.  Skin: Skin is warm and dry.  Psychiatric: She has a normal mood and affect. Her behavior is normal. Judgment and thought content normal.    Vital signs in last 24 hours:    Labs:   Estimated body mass index is 29.25 kg/m as calculated from the following:   Height as of 05/01/17: 5\' 4"  (1.626 m).   Weight as of 05/01/17: 77.3 kg (170 lb 6.4 oz).   Imaging Review Plain radiographs demonstrate  bilateral AP weight-bearing and a lateral view of the right knee are taken and reviewed in office today.  This shows end-stage arthritis with mild dishing of the lateral compartment.   Preoperative templating of the joint replacement has been completed,  documented, and submitted to the Operating Room personnel in order to optimize intra-operative equipment management.   Anticipated LOS equal to or greater than 2 midnights due to - Age 81 and older with one or more of the following:  - Obesity  - Expected need for hospital services (PT, OT, Nursing) required     for safe discharge  - Anticipated need for postoperative skilled nursing care   - Active co-morbidities: Coronary Artery Disease, Cardiac Arrhythmia and HTN      Assessment/Plan:  End stage arthritis, right knee   The patient history, physical examination, clinical judgment of the provider and imaging studies are consistent with end stage degenerative joint disease of the right knee(s) and total knee arthroplasty is deemed medically necessary. The treatment options including medical management, injection therapy arthroscopy and arthroplasty were discussed at length. The risks and benefits of total knee arthroplasty were presented and reviewed. The risks due to aseptic loosening, infection, stiffness, patella tracking problems,  thromboembolic complications and other imponderables were discussed. The patient acknowledged the explanation, agreed to proceed with the plan and consent was signed. Patient is being admitted for inpatient treatment for surgery, pain control, PT, OT, prophylactic antibiotics, VTE prophylaxis, progressive ambulation and ADL's and discharge planning. The patient is planning to be discharged to skilled nursing facility.

## 2017-05-09 MED ORDER — TRANEXAMIC ACID 1000 MG/10ML IV SOLN
1000.0000 mg | INTRAVENOUS | Status: AC
  Administered 2017-05-10: 1000 mg via INTRAVENOUS
  Filled 2017-05-09: qty 1100

## 2017-05-09 MED ORDER — TRANEXAMIC ACID 1000 MG/10ML IV SOLN
2000.0000 mg | INTRAVENOUS | Status: AC
  Administered 2017-05-10: 2000 mg via TOPICAL
  Filled 2017-05-09: qty 20

## 2017-05-09 MED ORDER — BUPIVACAINE LIPOSOME 1.3 % IJ SUSP
20.0000 mL | Freq: Once | INTRAMUSCULAR | Status: AC
  Administered 2017-05-10: 20 mL
  Filled 2017-05-09: qty 20

## 2017-05-10 ENCOUNTER — Encounter (HOSPITAL_COMMUNITY): Payer: Self-pay | Admitting: Urology

## 2017-05-10 ENCOUNTER — Other Ambulatory Visit: Payer: Self-pay

## 2017-05-10 ENCOUNTER — Inpatient Hospital Stay (HOSPITAL_COMMUNITY): Payer: Medicare HMO | Admitting: Certified Registered Nurse Anesthetist

## 2017-05-10 ENCOUNTER — Inpatient Hospital Stay (HOSPITAL_COMMUNITY)
Admission: RE | Admit: 2017-05-10 | Discharge: 2017-05-12 | DRG: 470 | Disposition: A | Discharge status: Home Health | Payer: Medicare HMO | Attending: Orthopedic Surgery | Admitting: Orthopedic Surgery

## 2017-05-10 ENCOUNTER — Inpatient Hospital Stay (HOSPITAL_COMMUNITY): Payer: Medicare HMO | Admitting: Emergency Medicine

## 2017-05-10 ENCOUNTER — Encounter (HOSPITAL_COMMUNITY)
Admission: RE | Disposition: A | Discharge status: Home Health | Payer: Self-pay | Source: Home / Self Care | Attending: Orthopedic Surgery

## 2017-05-10 DIAGNOSIS — E669 Obesity, unspecified: Secondary | ICD-10-CM | POA: Diagnosis present

## 2017-05-10 DIAGNOSIS — Z79899 Other long term (current) drug therapy: Secondary | ICD-10-CM | POA: Diagnosis not present

## 2017-05-10 DIAGNOSIS — Z886 Allergy status to analgesic agent status: Secondary | ICD-10-CM | POA: Diagnosis not present

## 2017-05-10 DIAGNOSIS — Z888 Allergy status to other drugs, medicaments and biological substances status: Secondary | ICD-10-CM

## 2017-05-10 DIAGNOSIS — J45909 Unspecified asthma, uncomplicated: Secondary | ICD-10-CM | POA: Diagnosis present

## 2017-05-10 DIAGNOSIS — K219 Gastro-esophageal reflux disease without esophagitis: Secondary | ICD-10-CM | POA: Diagnosis not present

## 2017-05-10 DIAGNOSIS — Z880 Allergy status to penicillin: Secondary | ICD-10-CM | POA: Diagnosis not present

## 2017-05-10 DIAGNOSIS — Z96641 Presence of right artificial hip joint: Secondary | ICD-10-CM | POA: Diagnosis present

## 2017-05-10 DIAGNOSIS — Z6829 Body mass index (BMI) 29.0-29.9, adult: Secondary | ICD-10-CM | POA: Diagnosis not present

## 2017-05-10 DIAGNOSIS — R011 Cardiac murmur, unspecified: Secondary | ICD-10-CM | POA: Diagnosis present

## 2017-05-10 DIAGNOSIS — Z961 Presence of intraocular lens: Secondary | ICD-10-CM | POA: Diagnosis present

## 2017-05-10 DIAGNOSIS — Z9842 Cataract extraction status, left eye: Secondary | ICD-10-CM

## 2017-05-10 DIAGNOSIS — I10 Essential (primary) hypertension: Secondary | ICD-10-CM | POA: Diagnosis present

## 2017-05-10 DIAGNOSIS — Z882 Allergy status to sulfonamides status: Secondary | ICD-10-CM | POA: Diagnosis not present

## 2017-05-10 DIAGNOSIS — M25561 Pain in right knee: Secondary | ICD-10-CM | POA: Diagnosis present

## 2017-05-10 DIAGNOSIS — Z8249 Family history of ischemic heart disease and other diseases of the circulatory system: Secondary | ICD-10-CM

## 2017-05-10 DIAGNOSIS — Z9841 Cataract extraction status, right eye: Secondary | ICD-10-CM

## 2017-05-10 DIAGNOSIS — G8918 Other acute postprocedural pain: Secondary | ICD-10-CM | POA: Diagnosis not present

## 2017-05-10 DIAGNOSIS — Z90710 Acquired absence of both cervix and uterus: Secondary | ICD-10-CM

## 2017-05-10 DIAGNOSIS — D62 Acute posthemorrhagic anemia: Secondary | ICD-10-CM | POA: Diagnosis not present

## 2017-05-10 DIAGNOSIS — M1711 Unilateral primary osteoarthritis, right knee: Principal | ICD-10-CM | POA: Diagnosis present

## 2017-05-10 DIAGNOSIS — M25761 Osteophyte, right knee: Secondary | ICD-10-CM | POA: Diagnosis not present

## 2017-05-10 DIAGNOSIS — Z825 Family history of asthma and other chronic lower respiratory diseases: Secondary | ICD-10-CM | POA: Diagnosis not present

## 2017-05-10 DIAGNOSIS — I6523 Occlusion and stenosis of bilateral carotid arteries: Secondary | ICD-10-CM | POA: Diagnosis not present

## 2017-05-10 HISTORY — PX: TOTAL KNEE ARTHROPLASTY: SHX125

## 2017-05-10 SURGERY — ARTHROPLASTY, KNEE, TOTAL
Anesthesia: Spinal | Site: Knee | Laterality: Right

## 2017-05-10 MED ORDER — SUGAMMADEX SODIUM 500 MG/5ML IV SOLN
INTRAVENOUS | Status: AC
  Filled 2017-05-10: qty 5

## 2017-05-10 MED ORDER — METOCLOPRAMIDE HCL 5 MG PO TABS: 5.0000 mg | ORAL_TABLET | Freq: Three times a day (TID) | ORAL | Status: DC | PRN

## 2017-05-10 MED ORDER — MIDAZOLAM HCL 2 MG/2ML IJ SOLN
1.0000 mg | Freq: Once | INTRAMUSCULAR | Status: AC
  Administered 2017-05-10: 1 mg via INTRAVENOUS

## 2017-05-10 MED ORDER — PHENYLEPHRINE HCL 10 MG/ML IJ SOLN
INTRAVENOUS | Status: DC | PRN
  Administered 2017-05-10: 25 ug/min via INTRAVENOUS

## 2017-05-10 MED ORDER — DOCUSATE SODIUM 100 MG PO CAPS
100.0000 mg | ORAL_CAPSULE | Freq: Two times a day (BID) | ORAL | Status: DC
  Administered 2017-05-10 – 2017-05-12 (×4): 100 mg via ORAL
  Filled 2017-05-10 (×4): qty 1

## 2017-05-10 MED ORDER — PHENOL 1.4 % MT LIQD: 1.0000 | OROMUCOSAL | Status: DC | PRN

## 2017-05-10 MED ORDER — DEXAMETHASONE SODIUM PHOSPHATE 10 MG/ML IJ SOLN
INTRAMUSCULAR | Status: DC | PRN
  Administered 2017-05-10: 5 mg via INTRAVENOUS

## 2017-05-10 MED ORDER — PANTOPRAZOLE SODIUM 40 MG PO TBEC
40.0000 mg | DELAYED_RELEASE_TABLET | Freq: Every day | ORAL | Status: DC
  Administered 2017-05-10 – 2017-05-12 (×3): 40 mg via ORAL
  Filled 2017-05-10 (×3): qty 1

## 2017-05-10 MED ORDER — METOCLOPRAMIDE HCL 5 MG/ML IJ SOLN: 5.0000 mg | Freq: Three times a day (TID) | INTRAMUSCULAR | Status: DC | PRN

## 2017-05-10 MED ORDER — FERROUS SULFATE 325 (65 FE) MG PO TABS: 325.0000 mg | ORAL_TABLET | Freq: Every day | ORAL | Status: DC | PRN

## 2017-05-10 MED ORDER — HYDROCHLOROTHIAZIDE 25 MG PO TABS
25.0000 mg | ORAL_TABLET | Freq: Every day | ORAL | Status: DC
  Administered 2017-05-10 – 2017-05-12 (×3): 25 mg via ORAL
  Filled 2017-05-10 (×3): qty 1

## 2017-05-10 MED ORDER — ACETAMINOPHEN 325 MG PO TABS
325.0000 mg | ORAL_TABLET | Freq: Four times a day (QID) | ORAL | Status: DC | PRN
  Administered 2017-05-12: 650 mg via ORAL
  Filled 2017-05-10: qty 2

## 2017-05-10 MED ORDER — FLEET ENEMA 7-19 GM/118ML RE ENEM: 1.0000 | ENEMA | Freq: Once | RECTAL | Status: DC | PRN

## 2017-05-10 MED ORDER — ONDANSETRON HCL 4 MG/2ML IJ SOLN
INTRAMUSCULAR | Status: AC
  Filled 2017-05-10: qty 4

## 2017-05-10 MED ORDER — DEXAMETHASONE SODIUM PHOSPHATE 10 MG/ML IJ SOLN
INTRAMUSCULAR | Status: AC
  Filled 2017-05-10: qty 2

## 2017-05-10 MED ORDER — FENTANYL CITRATE (PF) 250 MCG/5ML IJ SOLN
INTRAMUSCULAR | Status: DC | PRN
  Administered 2017-05-10 (×2): 25 ug via INTRAVENOUS

## 2017-05-10 MED ORDER — MIDAZOLAM HCL 2 MG/2ML IJ SOLN
INTRAMUSCULAR | Status: AC
  Administered 2017-05-10: 1 mg via INTRAVENOUS
  Filled 2017-05-10: qty 2

## 2017-05-10 MED ORDER — PROPOFOL 500 MG/50ML IV EMUL
INTRAVENOUS | Status: DC | PRN
  Administered 2017-05-10: 100 ug/kg/min via INTRAVENOUS

## 2017-05-10 MED ORDER — METHOCARBAMOL 1000 MG/10ML IJ SOLN
500.0000 mg | Freq: Four times a day (QID) | INTRAVENOUS | Status: DC | PRN
  Filled 2017-05-10: qty 5

## 2017-05-10 MED ORDER — FENTANYL CITRATE (PF) 250 MCG/5ML IJ SOLN
INTRAMUSCULAR | Status: AC
  Filled 2017-05-10: qty 5

## 2017-05-10 MED ORDER — LACTATED RINGERS IV SOLN
INTRAVENOUS | Status: DC | PRN
  Administered 2017-05-10: 13:00:00 via INTRAVENOUS

## 2017-05-10 MED ORDER — APIXABAN 2.5 MG PO TABS
2.5000 mg | ORAL_TABLET | Freq: Two times a day (BID) | ORAL | Status: DC
  Administered 2017-05-11 – 2017-05-12 (×3): 2.5 mg via ORAL
  Filled 2017-05-10 (×3): qty 1

## 2017-05-10 MED ORDER — SODIUM CHLORIDE 0.9 % IR SOLN
Status: DC | PRN
  Administered 2017-05-10: 3000 mL

## 2017-05-10 MED ORDER — TIZANIDINE HCL 2 MG PO TABS: 2.0000 mg | ORAL_TABLET | Freq: Four times a day (QID) | ORAL | 0 refills | Status: DC | PRN

## 2017-05-10 MED ORDER — BUPIVACAINE-EPINEPHRINE (PF) 0.25% -1:200000 IJ SOLN
INTRAMUSCULAR | Status: DC | PRN
  Administered 2017-05-10: 50 mL

## 2017-05-10 MED ORDER — TRANEXAMIC ACID 1000 MG/10ML IV SOLN
1000.0000 mg | Freq: Once | INTRAVENOUS | Status: AC
  Administered 2017-05-10: 1000 mg via INTRAVENOUS
  Filled 2017-05-10: qty 10

## 2017-05-10 MED ORDER — ALBUTEROL SULFATE (2.5 MG/3ML) 0.083% IN NEBU: 2.5000 mg | INHALATION_SOLUTION | Freq: Four times a day (QID) | RESPIRATORY_TRACT | Status: DC | PRN

## 2017-05-10 MED ORDER — AMLODIPINE BESYLATE 5 MG PO TABS
5.0000 mg | ORAL_TABLET | Freq: Every day | ORAL | Status: DC
  Administered 2017-05-11 – 2017-05-12 (×2): 5 mg via ORAL
  Filled 2017-05-10 (×2): qty 1

## 2017-05-10 MED ORDER — MIDAZOLAM HCL 2 MG/2ML IJ SOLN: 0.5000 mg | Freq: Once | INTRAMUSCULAR | Status: DC | PRN

## 2017-05-10 MED ORDER — LIDOCAINE 2% (20 MG/ML) 5 ML SYRINGE
INTRAMUSCULAR | Status: DC | PRN
  Administered 2017-05-10: 60 mg via INTRAVENOUS

## 2017-05-10 MED ORDER — GABAPENTIN 300 MG PO CAPS
300.0000 mg | ORAL_CAPSULE | Freq: Three times a day (TID) | ORAL | Status: DC
  Administered 2017-05-10 – 2017-05-12 (×5): 300 mg via ORAL
  Filled 2017-05-10 (×5): qty 1

## 2017-05-10 MED ORDER — BUPIVACAINE-EPINEPHRINE 0.25% -1:200000 IJ SOLN
INTRAMUSCULAR | Status: AC
Start: 2017-05-10 — End: ?
  Filled 2017-05-10: qty 1

## 2017-05-10 MED ORDER — SODIUM CHLORIDE 0.9 % IJ SOLN
INTRAMUSCULAR | Status: DC | PRN
  Administered 2017-05-10: 50 mL

## 2017-05-10 MED ORDER — MENTHOL 3 MG MT LOZG: 1.0000 | LOZENGE | OROMUCOSAL | Status: DC | PRN

## 2017-05-10 MED ORDER — POLYETHYLENE GLYCOL 3350 17 G PO PACK: 17.0000 g | PACK | Freq: Every day | ORAL | Status: DC | PRN

## 2017-05-10 MED ORDER — ZOLPIDEM TARTRATE 5 MG PO TABS: 5.0000 mg | ORAL_TABLET | Freq: Every evening | ORAL | Status: DC | PRN

## 2017-05-10 MED ORDER — KCL IN DEXTROSE-NACL 20-5-0.45 MEQ/L-%-% IV SOLN
INTRAVENOUS | Status: DC
  Administered 2017-05-10 – 2017-05-11 (×2): via INTRAVENOUS
  Filled 2017-05-10 (×4): qty 1000

## 2017-05-10 MED ORDER — DIPHENHYDRAMINE HCL 12.5 MG/5ML PO ELIX: 12.5000 mg | ORAL_SOLUTION | ORAL | Status: DC | PRN

## 2017-05-10 MED ORDER — CHLORHEXIDINE GLUCONATE 4 % EX LIQD: 60.0000 mL | Freq: Once | CUTANEOUS | Status: DC

## 2017-05-10 MED ORDER — ALUM & MAG HYDROXIDE-SIMETH 200-200-20 MG/5ML PO SUSP: 30.0000 mL | ORAL | Status: DC | PRN

## 2017-05-10 MED ORDER — FENTANYL CITRATE (PF) 100 MCG/2ML IJ SOLN
50.0000 ug | Freq: Once | INTRAMUSCULAR | Status: AC
  Administered 2017-05-10: 50 ug via INTRAVENOUS

## 2017-05-10 MED ORDER — LIDOCAINE HCL (CARDIAC) 20 MG/ML IV SOLN
INTRAVENOUS | Status: AC
  Filled 2017-05-10: qty 10

## 2017-05-10 MED ORDER — OXYCODONE HCL 5 MG PO TABS
5.0000 mg | ORAL_TABLET | ORAL | Status: DC | PRN
  Administered 2017-05-10 – 2017-05-11 (×2): 10 mg via ORAL
  Filled 2017-05-10 (×2): qty 2

## 2017-05-10 MED ORDER — FENTANYL CITRATE (PF) 100 MCG/2ML IJ SOLN
INTRAMUSCULAR | Status: AC
  Administered 2017-05-10: 50 ug via INTRAVENOUS
  Filled 2017-05-10: qty 2

## 2017-05-10 MED ORDER — CEFAZOLIN SODIUM 1 G IJ SOLR
INTRAMUSCULAR | Status: AC
  Filled 2017-05-10: qty 30

## 2017-05-10 MED ORDER — PROPOFOL 10 MG/ML IV BOLUS
INTRAVENOUS | Status: DC | PRN
  Administered 2017-05-10: 20 mg via INTRAVENOUS

## 2017-05-10 MED ORDER — HYDROMORPHONE HCL 1 MG/ML IJ SOLN: 0.5000 mg | INTRAMUSCULAR | Status: DC | PRN

## 2017-05-10 MED ORDER — VANCOMYCIN HCL IN DEXTROSE 1-5 GM/200ML-% IV SOLN
INTRAVENOUS | Status: AC
  Administered 2017-05-10: 1000 mg via INTRAVENOUS
  Filled 2017-05-10: qty 200

## 2017-05-10 MED ORDER — LORATADINE 10 MG PO TABS
10.0000 mg | ORAL_TABLET | Freq: Every day | ORAL | Status: DC
  Administered 2017-05-11 – 2017-05-12 (×2): 10 mg via ORAL
  Filled 2017-05-10 (×2): qty 1

## 2017-05-10 MED ORDER — ONDANSETRON HCL 4 MG/2ML IJ SOLN: 4.0000 mg | Freq: Four times a day (QID) | INTRAMUSCULAR | Status: DC | PRN

## 2017-05-10 MED ORDER — ONDANSETRON HCL 4 MG PO TABS: 4.0000 mg | ORAL_TABLET | Freq: Four times a day (QID) | ORAL | Status: DC | PRN

## 2017-05-10 MED ORDER — PROMETHAZINE HCL 25 MG/ML IJ SOLN: 6.2500 mg | INTRAMUSCULAR | Status: DC | PRN

## 2017-05-10 MED ORDER — LACTATED RINGERS IV SOLN
INTRAVENOUS | Status: DC
  Administered 2017-05-10: 11:00:00 via INTRAVENOUS

## 2017-05-10 MED ORDER — BUPIVACAINE IN DEXTROSE 0.75-8.25 % IT SOLN
INTRATHECAL | Status: DC | PRN
  Administered 2017-05-10: 1.6 mL via INTRATHECAL

## 2017-05-10 MED ORDER — MEPERIDINE HCL 50 MG/ML IJ SOLN: 6.2500 mg | INTRAMUSCULAR | Status: DC | PRN

## 2017-05-10 MED ORDER — FAMOTIDINE 20 MG PO TABS
20.0000 mg | ORAL_TABLET | Freq: Two times a day (BID) | ORAL | Status: DC
  Administered 2017-05-10 – 2017-05-12 (×4): 20 mg via ORAL
  Filled 2017-05-10 (×4): qty 1

## 2017-05-10 MED ORDER — ROPIVACAINE HCL 7.5 MG/ML IJ SOLN
INTRAMUSCULAR | Status: DC | PRN
  Administered 2017-05-10 (×4): 5 mL via PERINEURAL

## 2017-05-10 MED ORDER — ONDANSETRON HCL 4 MG/2ML IJ SOLN
INTRAMUSCULAR | Status: DC | PRN
  Administered 2017-05-10: 4 mg via INTRAVENOUS

## 2017-05-10 MED ORDER — 0.9 % SODIUM CHLORIDE (POUR BTL) OPTIME
TOPICAL | Status: DC | PRN
  Administered 2017-05-10: 1000 mL

## 2017-05-10 MED ORDER — METHOCARBAMOL 500 MG PO TABS: 500.0000 mg | ORAL_TABLET | Freq: Four times a day (QID) | ORAL | Status: DC | PRN

## 2017-05-10 MED ORDER — APIXABAN 2.5 MG PO TABS: 2.5000 mg | ORAL_TABLET | Freq: Two times a day (BID) | ORAL | 0 refills | Status: DC

## 2017-05-10 MED ORDER — VANCOMYCIN HCL IN DEXTROSE 1-5 GM/200ML-% IV SOLN
1000.0000 mg | INTRAVENOUS | Status: AC
  Administered 2017-05-10: 1000 mg via INTRAVENOUS

## 2017-05-10 MED ORDER — HYDROMORPHONE HCL 1 MG/ML IJ SOLN: 0.2500 mg | INTRAMUSCULAR | Status: DC | PRN

## 2017-05-10 MED ORDER — OXYCODONE-ACETAMINOPHEN 5-325 MG PO TABS: 1.0000 | ORAL_TABLET | ORAL | 0 refills | Status: DC | PRN

## 2017-05-10 MED ORDER — BISACODYL 5 MG PO TBEC: 5.0000 mg | DELAYED_RELEASE_TABLET | Freq: Every day | ORAL | Status: DC | PRN

## 2017-05-10 SURGICAL SUPPLY — 51 items
BANDAGE ELASTIC 6 VELCRO ST LF (GAUZE/BANDAGES/DRESSINGS) ×2 IMPLANT
BANDAGE ESMARK 6X9 LF (GAUZE/BANDAGES/DRESSINGS) ×1 IMPLANT
BLADE SAG 18X100X1.27 (BLADE) ×2 IMPLANT
BLADE SAGITTAL 13X1.27X60 (BLADE) IMPLANT
BLADE SAW SGTL 13X75X1.27 (BLADE) ×2 IMPLANT
BNDG ELASTIC 6X10 VLCR STRL LF (GAUZE/BANDAGES/DRESSINGS) IMPLANT
BNDG ESMARK 6X9 LF (GAUZE/BANDAGES/DRESSINGS) ×2
BOWL SMART MIX CTS (DISPOSABLE) ×2 IMPLANT
CAPT KNEE TOTAL 3 ATTUNE ×2 IMPLANT
CEMENT HV SMART SET (Cement) ×4 IMPLANT
COVER SURGICAL LIGHT HANDLE (MISCELLANEOUS) ×2 IMPLANT
CUFF TOURNIQUET SINGLE 34IN LL (TOURNIQUET CUFF) ×2 IMPLANT
CUFF TOURNIQUET SINGLE 44IN (TOURNIQUET CUFF) IMPLANT
DRAPE EXTREMITY T 121X128X90 (DRAPE) ×2 IMPLANT
DRAPE U-SHAPE 47X51 STRL (DRAPES) ×2 IMPLANT
DRSG AQUACEL AG ADV 3.5X10 (GAUZE/BANDAGES/DRESSINGS) ×2 IMPLANT
DURAPREP 26ML APPLICATOR (WOUND CARE) ×4 IMPLANT
ELECT REM PT RETURN 9FT ADLT (ELECTROSURGICAL) ×2
ELECTRODE REM PT RTRN 9FT ADLT (ELECTROSURGICAL) ×1 IMPLANT
GLOVE BIO SURGEON STRL SZ7.5 (GLOVE) ×4 IMPLANT
GLOVE BIO SURGEON STRL SZ8.5 (GLOVE) ×4 IMPLANT
GLOVE BIOGEL PI IND STRL 8 (GLOVE) ×1 IMPLANT
GLOVE BIOGEL PI IND STRL 9 (GLOVE) ×1 IMPLANT
GLOVE BIOGEL PI INDICATOR 8 (GLOVE) ×1
GLOVE BIOGEL PI INDICATOR 9 (GLOVE) ×1
GOWN STRL REUS W/ TWL LRG LVL3 (GOWN DISPOSABLE) ×1 IMPLANT
GOWN STRL REUS W/ TWL XL LVL3 (GOWN DISPOSABLE) ×2 IMPLANT
GOWN STRL REUS W/TWL LRG LVL3 (GOWN DISPOSABLE) ×1
GOWN STRL REUS W/TWL XL LVL3 (GOWN DISPOSABLE) ×2
HANDPIECE INTERPULSE COAX TIP (DISPOSABLE) ×1
HOOD PEEL AWAY FACE SHEILD DIS (HOOD) ×4 IMPLANT
KIT BASIN OR (CUSTOM PROCEDURE TRAY) ×2 IMPLANT
KIT TURNOVER KIT B (KITS) ×2 IMPLANT
MANIFOLD NEPTUNE II (INSTRUMENTS) ×2 IMPLANT
NEEDLE 22X1 1/2 (OR ONLY) (NEEDLE) ×4 IMPLANT
NS IRRIG 1000ML POUR BTL (IV SOLUTION) ×2 IMPLANT
PACK TOTAL JOINT (CUSTOM PROCEDURE TRAY) ×2 IMPLANT
PAD ARMBOARD 7.5X6 YLW CONV (MISCELLANEOUS) ×4 IMPLANT
SET HNDPC FAN SPRY TIP SCT (DISPOSABLE) ×1 IMPLANT
SUT VIC AB 0 CT1 27 (SUTURE) ×1
SUT VIC AB 0 CT1 27XBRD ANBCTR (SUTURE) ×1 IMPLANT
SUT VIC AB 1 CTX 36 (SUTURE) ×1
SUT VIC AB 1 CTX36XBRD ANBCTR (SUTURE) ×1 IMPLANT
SUT VIC AB 2-0 CT1 27 (SUTURE) ×1
SUT VIC AB 2-0 CT1 TAPERPNT 27 (SUTURE) ×1 IMPLANT
SUT VIC AB 3-0 CT1 27 (SUTURE) ×1
SUT VIC AB 3-0 CT1 TAPERPNT 27 (SUTURE) ×1 IMPLANT
SYR CONTROL 10ML LL (SYRINGE) ×4 IMPLANT
TOWEL OR 17X24 6PK STRL BLUE (TOWEL DISPOSABLE) ×2 IMPLANT
TOWEL OR 17X26 10 PK STRL BLUE (TOWEL DISPOSABLE) ×2 IMPLANT
TRAY CATH 16FR W/PLASTIC CATH (SET/KITS/TRAYS/PACK) ×2 IMPLANT

## 2017-05-10 NOTE — Interval H&P Note (Signed)
History and Physical Interval Note:  05/10/2017 12:53 PM  Bailey Wilson  has presented today for surgery, with the diagnosis of RIGHT KNEE OSTEOARTHRITIS  The various methods of treatment have been discussed with the patient and family. After consideration of risks, benefits and other options for treatment, the patient has consented to  Procedure(s): RIGHT TOTAL KNEE ARTHROPLASTY (Right) as a surgical intervention .  The patient's history has been reviewed, patient examined, no change in status, stable for surgery.  I have reviewed the patient's chart and labs.  Questions were answered to the patient's satisfaction.     [name]

## 2017-05-10 NOTE — Anesthesia Postprocedure Evaluation (Signed)
Anesthesia Post Note  Patient: YAMINA LENIS  Procedure(s) Performed: RIGHT TOTAL KNEE ARTHROPLASTY (Right Knee)     Patient location during evaluation: PACU Anesthesia Type: Spinal Level of consciousness: awake Pain management: pain level controlled Vital Signs Assessment: post-procedure vital signs reviewed and stable Respiratory status: spontaneous breathing Cardiovascular status: stable Postop Assessment: no headache, no backache, spinal receding, patient able to bend at knees and no apparent nausea or vomiting Anesthetic complications: no    Last Vitals:  Vitals:   05/10/17 1600 05/10/17 1610  BP:    Pulse: 89   Resp: (!) 22   Temp:  36.6 C  SpO2: 100%     Last Pain:  Vitals:   05/10/17 1610  TempSrc:   PainSc: Asleep   Pain Goal:                 Nyeshia Mysliwiec JR,JOHN Zariah Cavendish

## 2017-05-10 NOTE — Anesthesia Procedure Notes (Signed)
Anesthesia Regional Block: Adductor canal block   Pre-Anesthetic Checklist: ,, timeout performed, Correct Patient, Correct Site, Correct Laterality, Correct Procedure, Correct Position, site marked, Risks and benefits discussed,  Surgical consent,  Pre-op evaluation,  At surgeon's request and post-op pain management  Laterality: Right and Lower  Prep: chloraprep       Needles:  Injection technique: Single-shot  Needle Type: Echogenic Stimulator Needle     Needle Length: 9cm  Needle Gauge: 21   Needle insertion depth: 3 cm   Additional Needles:   Procedures:,,,, ultrasound used (permanent image in chart),,,,  Narrative:  Start time: 05/10/2017 12:55 PM End time: 05/10/2017 1:01 PM Injection made incrementally with aspirations every 5 mL.  Performed by: Personally  Anesthesiologist: Lyn Hollingshead, MD

## 2017-05-10 NOTE — Discharge Instructions (Signed)

## 2017-05-10 NOTE — Anesthesia Preprocedure Evaluation (Addendum)
Anesthesia Evaluation  Patient identified by MRN, date of birth, ID band Patient awake    Reviewed: Allergy & Precautions, NPO status , Patient's Chart, lab work & pertinent test results  Airway Mallampati: I       Dental no notable dental hx. (+) Teeth Intact   Pulmonary COPD,  COPD inhaler,    Pulmonary exam normal breath sounds clear to auscultation       Cardiovascular hypertension, Pt. on medications + Peripheral Vascular Disease (non-obstructive carotid disease)  Normal cardiovascular exam Rhythm:Regular Rate:Normal  '18 ECHO: EF 55-60%, valves OK   Neuro/Psych    GI/Hepatic GERD  Medicated,  Endo/Other    Renal/GU      Musculoskeletal  (+) Arthritis , Osteoarthritis,    Abdominal Normal abdominal exam  (+)   Peds  Hematology  (+) Blood dyscrasia, anemia ,   Anesthesia Other Findings   Reproductive/Obstetrics                            Anesthesia Physical Anesthesia Plan  ASA: II  Anesthesia Plan: Spinal   Post-op Pain Management:  Regional for Post-op pain   Induction:   PONV Risk Score and Plan: 2 and Ondansetron and Dexamethasone  Airway Management Planned: Natural Airway and Simple Face Mask  Additional Equipment:   Intra-op Plan:   Post-operative Plan:   Informed Consent: I have reviewed the patients History and Physical, chart, labs and discussed the procedure including the risks, benefits and alternatives for the proposed anesthesia with the patient or authorized representative who has indicated his/her understanding and acceptance.     Plan Discussed with: CRNA and Surgeon  Anesthesia Plan Comments:         Anesthesia Quick Evaluation

## 2017-05-10 NOTE — Op Note (Signed)
PATIENT ID:      Bailey Wilson  MRN:     [id number] DOB/AGE:    [birth year] / 78 y.o.       OPERATIVE REPORT    DATE OF PROCEDURE:  05/10/2017       PREOPERATIVE DIAGNOSIS:   RIGHT KNEE OSTEOARTHRITIS      Estimated body mass index is 29.25 kg/m as calculated from the following:   Height as of 05/01/17: 5\' 4"  (1.626 m).   Weight as of 05/01/17: 170 lb 6.4 oz (77.3 kg).                                                        POSTOPERATIVE DIAGNOSIS:   RIGHT KNEE OSTEOARTHRITIS                                                                      PROCEDURE:  Procedure(s): RIGHT TOTAL KNEE ARTHROPLASTY Using DepuyAttune RP implants #6R Femur, #7Tibia, 5 mm Attune RP bearing, 38 Patella     SURGEON: [name]    ASSISTANT:   Kerry Hough. [employer]   (Present and scrubbed throughout the case, critical for assistance with exposure, retraction, instrumentation, and closure.)         ANESTHESIA: spinal, 20cc Exparel, 50cc 0.25% Marcaine  EBL: 300  FLUID REPLACEMENT: 1500 crystalloid  TOURNIQUET TIME: none  Drains: None  Tranexamic Acid: 1gm IV, 2gm topical  COMPLICATIONS:  None         INDICATIONS FOR PROCEDURE: The patient has  RIGHT KNEE OSTEOARTHRITIS, Val deformities, XR shows bone on bone arthritis, lateral subluxation of tibia. Patient has failed all conservative measures including anti-inflammatory medicines, narcotics, attempts at  exercise and weight loss, cortisone injections and viscosupplementation.  Risks and benefits of surgery have been discussed, questions answered.   DESCRIPTION OF PROCEDURE: The patient identified by armband, received  IV antibiotics, in the holding area at [facility]. Patient taken to the operating room, appropriate anesthetic  monitors were attached, and Spinal anesthesia was  induced. Tourniquet  applied high to the operative thigh. Lateral post and foot positioner  applied to the table, the lower extremity was then prepped and draped  in usual  sterile fashion from the toes to the tourniquet. Time-out procedure was performed. We began the operation, with the knee flexed 120 degrees, by making the anterior midline incision starting at handbreadth above the patella going over the patella 1 cm medial to and 4 cm distal to the tibial tubercle. Small bleeders in the skin and the  subcutaneous tissue identified and cauterized. Transverse retinaculum was incised and reflected medially and a medial parapatellar arthrotomy was accomplished. the patella was everted and theprepatellar fat pad resected. The superficial medial collateral  ligament was then elevated from anterior to posterior along the proximal  flare of the tibia and anterior half of the menisci resected. The knee was hyperflexed exposing bone on bone arthritis. Peripheral and notch osteophytes as well as the cruciate ligaments were then resected. We continued to  work our way around posteriorly along the  proximal tibia, and externally  rotated the tibia subluxing it out from underneath the femur. A McHale  retractor was placed through the notch and a lateral Hohmann retractor  placed, and we then drilled through the proximal tibia in line with the  axis of the tibia followed by an intramedullary guide rod and 2-degree  posterior slope cutting guide. The tibial cutting guide, 3 degree posterior sloped, was pinned into place allowing resection of 7 mm of bone medially and 0 mm of bone laterally. Satisfied with the tibial resection, we then  entered the distal femur 2 mm anterior to the PCL origin with the  intramedullary guide rod and applied the distal femoral cutting guide  set at 9 mm, with 5 degrees of valgus. This was pinned along the  epicondylar axis. At this point, the distal femoral cut was accomplished without difficulty. We then sized for a #6R femoral component and pinned the guide in 0 degrees of external rotation. The chamfer cutting guide was pinned into place. The anterior,  posterior, and chamfer cuts were accomplished without difficulty followed by  the Attune RP box cutting guide and the box cut. We also removed posterior osteophytes from the posterior femoral condyles. At this  time, the knee was brought into full extension. We checked our  extension and flexion gaps and found them symmetric for a 5 mm bearing. Distracting in extension with a lamina spreader, the posterior horns of the menisci were removed, and Exparel, diluted to 60 cc, with 20cc NS, and 20cc 0.5% Marcaine,was injected into the capsule and synovium of the knee. The posterior patella cut was accomplished with the 9.5 mm Attune cutting guide, sized for a 4mm dome, and the fixation pegs drilled.The knee  was then once again hyperflexed exposing the proximal tibia. We sized for a # 7 tibial base plate, applied the smokestack and the conical reamer followed by the the Delta fin keel punch. We then hammered into place the Attune RP trial femoral component, drilled the lugs, inserted a  5 mm trial bearing, trial patellar button, and took the knee through range of motion from 0-130 degrees. No thumb pressure was required for patellar Tracking. At this point, the limb was wrapped with an Esmarch bandage and the tourniquet inflated to 350 mmHg. All trial components were removed, mating surfaces irrigated with pulse lavage, and dried with suction and sponges. 10 cc of the Exparel solution was applied to the cancellus bone of the patella distal femur and proximal tibia.  After waiting 1 minute, the bony surfaces were again, dried with sponges. A double batch of DePuy HV cement with 1500 mg of Zinacef was mixed and applied to all bony metallic mating surfaces except for the posterior condyles of the femur itself. In order, we hammered into place the tibial tray and removed excess cement, the femoral component and removed excess cement. The final Attune RP bearing  was inserted, and the knee brought to full extension with  compression.  The patellar button was clamped into place, and excess cement  removed. While the cement cured the wound was irrigated out with normal saline solution pulse lavage. Ligament stability and patellar tracking were checked and found to be excellent. The parapatellar arthrotomy was closed with  running #1 Vicryl suture. The subcutaneous tissue with 0 and 2-0 undyed  Vicryl suture, and the skin with running 3-0 SQ vicryl. A dressing of Xeroform,  4 x 4, dressing sponges, Webril, and Ace wrap applied. The patient  awakened,  and taken to recovery room without difficulty.   [name] 05/10/2017, 2:54 PM

## 2017-05-10 NOTE — Anesthesia Procedure Notes (Signed)
Spinal  Start time: 05/10/2017 1:15 PM End time: 05/10/2017 1:17 PM Staffing Anesthesiologist: Lyn Hollingshead, MD Performed: anesthesiologist  Preanesthetic Checklist Completed: patient identified, site marked, surgical consent, pre-op evaluation, timeout performed, IV checked, risks and benefits discussed and monitors and equipment checked Spinal Block Patient position: sitting Prep: site prepped and draped and DuraPrep Patient monitoring: continuous pulse ox and blood pressure Approach: midline Location: L3-4 Injection technique: single-shot Needle Needle type: Pencan  Needle gauge: 24 G Needle length: 10 cm Needle insertion depth: 4 cm Assessment Sensory level: T8

## 2017-05-10 NOTE — Progress Notes (Signed)
Orthopedic Tech Progress Note Patient Details:  Bailey Wilson Jan 06, 1941 [id number]  Ortho Devices Type of Ortho Device: Bone foam zero knee Ortho Device/Splint Interventions: Criss Alvine 05/10/2017, 4:07 PM

## 2017-05-10 NOTE — Transfer of Care (Signed)
Immediate Anesthesia Transfer of Care Note  Patient: Bailey Wilson  Procedure(s) Performed: RIGHT TOTAL KNEE ARTHROPLASTY (Right Knee)  Patient Location: PACU  Anesthesia Type:MAC combined with regional for post-op pain  Level of Consciousness: awake and patient cooperative  Airway & Oxygen Therapy: Patient Spontanous Breathing  Post-op Assessment: Report given to RN and Post -op Vital signs reviewed and stable  Post vital signs: Reviewed and stable  Last Vitals:  Vitals Value Taken Time  BP 102/59 05/10/2017  3:12 PM  Temp    Pulse 100 05/10/2017  3:13 PM  Resp 15 05/10/2017  3:13 PM  SpO2 100 % 05/10/2017  3:13 PM  Vitals shown include unvalidated device data.  Last Pain:  Vitals:   05/10/17 1103  TempSrc: Oral  PainSc:          Complications: No apparent anesthesia complications

## 2017-05-11 ENCOUNTER — Encounter (HOSPITAL_COMMUNITY): Payer: Self-pay | Admitting: Orthopedic Surgery

## 2017-05-11 LAB — BASIC METABOLIC PANEL
Anion gap: 9 (ref 5–15)
BUN: 9 mg/dL (ref 6–20)
CHLORIDE: 96 mmol/L — AB (ref 101–111)
CO2: 27 mmol/L (ref 22–32)
CREATININE: 0.84 mg/dL (ref 0.44–1.00)
Calcium: 9.5 mg/dL (ref 8.9–10.3)
GFR calc Af Amer: >60 mL/min (ref >60)
GFR calc non Af Amer: >60 mL/min (ref >60)
GLUCOSE: 147 mg/dL — AB (ref 65–99)
POTASSIUM: 4.2 mmol/L (ref 3.5–5.1)
SODIUM: 132 mmol/L — AB (ref 135–145)

## 2017-05-11 LAB — CBC
HCT: 28.8 % — ABNORMAL LOW (ref 36.0–46.0)
HEMOGLOBIN: 9.6 g/dL — AB (ref 12.0–15.0)
MCH: 27.7 pg (ref 26.0–34.0)
MCHC: 33.3 g/dL (ref 30.0–36.0)
MCV: 83 fL (ref 78.0–100.0)
Platelets: 222 10*3/uL (ref 150–400)
RBC: 3.47 MIL/uL — AB (ref 3.87–5.11)
RDW: 13.8 % (ref 11.5–15.5)
WBC: 8.6 10*3/uL (ref 4.0–10.5)

## 2017-05-11 NOTE — Evaluation (Signed)
Physical Therapy Evaluation Patient Details Name: Bailey Wilson MRN: [id number] DOB: December 28, 1940 Today's Date: 05/11/2017   History of Present Illness  77 y.o. female s/p R TKA 3/27. PMH includes: Carotid Stenosis, GERD, HTN, R THA.   Clinical Impression  Patient is s/p above surgery resulting in functional limitations due to the deficits listed below (see PT Problem List). PTA, pt living alone independent with all mobility. Patient plans on returning to her daughters home with 3 stairs to enter and 24 hour care while she recovers. Upon eval, patient presents with mild post op pain and weakness. Mod I for bed mobility, min guard for OOB walking short distances. Next PT visit will progress activity and improve gait mechanics. Plan to initiate stair training tomorrow AM.  Patient will benefit from skilled PT to increase their independence and safety with mobility to allow discharge to the venue listed below.       Follow Up Recommendations Follow surgeon's recommendation for DC plan and follow-up therapies;Supervision/Assistance - 24 hour    Equipment Recommendations    None   Recommendations for Other Services       Precautions / Restrictions Precautions Precautions: Fall;Knee Precaution Booklet Issued: Yes (comment) Precaution Comments: reviewed no pillow under knee and supine therex Restrictions Weight Bearing Restrictions: Yes RLE Weight Bearing: Weight bearing as tolerated      Mobility  Bed Mobility Overal bed mobility: Modified Independent             General bed mobility comments: supine to sit without physical assistance.  Transfers Overall transfer level: Needs assistance Equipment used: Rolling walker (2 wheeled) Transfers: Sit to/from Stand Sit to Stand: Min guard         General transfer comment: Cues for hand placement, increased time and effort to stand. transfers to bathroom and bedside chair with cueing.   Ambulation/Gait Ambulation/Gait assistance:  Min guard Ambulation Distance (Feet): 25 Feet Assistive device: Rolling walker (2 wheeled) Gait Pattern/deviations: Step-to pattern;Antalgic Gait velocity: decreased   General Gait Details: walking in hospital room too bathroom and chair with step to gait.   Stairs            Wheelchair Mobility    Modified Rankin (Stroke Patients Only)       Balance Overall balance assessment: Needs assistance   Sitting balance-Leahy Scale: Good       Standing balance-Leahy Scale: Fair                               Pertinent Vitals/Pain      Home Living Family/patient expects to be discharged to:: Private residence Living Arrangements: Alone(staying with daughter) Available Help at Discharge: Available 24 hours/day Type of Home: House Home Access: Stairs to enter Entrance Stairs-Rails: Can reach both Entrance Stairs-Number of Steps: 3 Home Layout: One level Home Equipment: Grab bars - toilet;Shower seat      Prior Function Level of Independence: Independent         Comments: lives alone, drives, goes to exercise class, no AD     Hand Dominance   Dominant Hand: Right    Extremity/Trunk Assessment   Upper Extremity Assessment Upper Extremity Assessment: Overall WFL for tasks assessed    Lower Extremity Assessment Lower Extremity Assessment: (RLE strength 3-/5 post op, LLE strength WNL)    Cervical / Trunk Assessment Cervical / Trunk Assessment: Normal  Communication   Communication: No difficulties  Cognition Arousal/Alertness: Awake/alert Behavior During Therapy:  WFL for tasks assessed/performed Overall Cognitive Status: Within Functional Limits for tasks assessed                                        General Comments      Exercises Total Joint Exercises Ankle Circles/Pumps: 20 reps Quad Sets: 10 reps Heel Slides: 10 reps   Assessment/Plan    PT Assessment Patient needs continued PT services  PT Problem List  Decreased strength;Decreased range of motion;Decreased activity tolerance;Decreased balance;Decreased mobility;Pain       PT Treatment Interventions DME instruction;Gait training;Stair training;Functional mobility training;Therapeutic activities;Therapeutic exercise;Balance training    PT Goals (Current goals can be found in the Care Plan section)  Acute Rehab PT Goals Patient Stated Goal: stay with daugther and recover PT Goal Formulation: With patient Time For Goal Achievement: 05/18/17 Potential to Achieve Goals: Good    Frequency 7X/week   Barriers to discharge        Co-evaluation               AM-PAC PT "6 Clicks" Daily Activity  Outcome Measure Difficulty turning over in bed (including adjusting bedclothes, sheets and blankets)?: Unable Difficulty moving from lying on back to sitting on the side of the bed? : Unable Difficulty sitting down on and standing up from a chair with arms (e.g., wheelchair, bedside commode, etc,.)?: Unable Help needed moving to and from a bed to chair (including a wheelchair)?: A Little Help needed walking in hospital room?: A Little Help needed climbing 3-5 steps with a railing? : A Little 6 Click Score: 12    End of Session Equipment Utilized During Treatment: Gait belt Activity Tolerance: Patient tolerated treatment well Patient left: in bed;with call bell/phone within reach;with family/visitor present Nurse Communication: Mobility status PT Visit Diagnosis: Unsteadiness on feet (R26.81);Other abnormalities of gait and mobility (R26.89);Muscle weakness (generalized) (M62.81);Pain Pain - Right/Left: Right Pain - part of body: Knee    Time: 0931-1030 PT Time Calculation (min) (ACUTE ONLY): 59 min   Charges:   PT Evaluation $PT Eval Low Complexity: 1 Low PT Treatments $Gait Training: 8-22 mins $Therapeutic Exercise: 8-22 mins $Therapeutic Activity: 8-22 mins   PT G Codes:       Reinaldo Berber, PT, DPT Acute Rehab  Services Pager: [phone]    Reinaldo Berber 05/11/2017, 10:37 AM

## 2017-05-11 NOTE — Progress Notes (Signed)
PATIENT ID: Bailey Wilson  MRN: [id number]  DOB/AGE:  Mar 09, 1940 / 77 y.o.  1 Day Post-Op Procedure(s) (LRB): RIGHT TOTAL KNEE ARTHROPLASTY (Right)    PROGRESS NOTE Subjective: Patient is alert, oriented, no Nausea, no Vomiting, yes passing gas. Taking PO well. Denies SOB, Chest or Calf Pain. Using Incentive Spirometer, PAS in place. Ambulate no up yet, Patient reports pain as 3/10 .    Objective: Vital signs in last 24 hours: Vitals:   05/10/17 1721 05/10/17 2016 05/11/17 0009 05/11/17 0535  BP: 135/65 136/64 130/64 137/60  Pulse: 81 87 84 76  Resp: 18 18 17 18   Temp: 98.1 F (36.7 C) 98.4 F (36.9 C) 98.1 F (36.7 C) 98 F (36.7 C)  TempSrc: Oral Oral Oral Oral  SpO2: 99% 98% 98% 100%  Weight: 170 lb (77.1 kg)     Height: 5\' 4"  (1.626 m)         Intake/Output from previous day: I/O last 3 completed shifts: In: 2223.3 [P.O.:100; I.V.:2013.3; IV Piggyback:110] Out: 3474 [Urine:1450; Blood:200]   Intake/Output this shift: No intake/output data recorded.   LABORATORY DATA: Recent Labs    05/11/17 0526  WBC 8.6  HGB 9.6*  HCT 28.8*  PLT 222  NA 132*  K 4.2  CL 96*  CO2 27  BUN 9  CREATININE 0.84  GLUCOSE 147*  CALCIUM 9.5    Examination: Neurologically intact ABD soft Neurovascular intact Sensation intact distally Intact pulses distally Dorsiflexion/Plantar flexion intact Incision: dressing C/D/I No cellulitis present Compartment soft}  Assessment:   1 Day Post-Op Procedure(s) (LRB): RIGHT TOTAL KNEE ARTHROPLASTY (Right) ADDITIONAL DIAGNOSIS: Expected Acute Blood Loss Anemia, Hypertension, carotid stenosis Anticipated LOS equal to or greater than 2 midnights due to - Age 70 and older with one or more of the following:  - Obesity  - Expected need for hospital services (PT, OT, Nursing) required for safe  discharge  - Active co-morbidities: Anemia, carotid stenosis    Plan: PT/OT WBAT, AROM and PROM  DVT Prophylaxis:  SCDx72hrs, ASA 325 mg BID x  2 weeks DISCHARGE PLAN: Home, when patient passes physical therapy hopefully by tomorrow DISCHARGE NEEDS: HHPT, Walker and 3-in-1 comode seat     [name] 05/11/2017, 7:36 AM

## 2017-05-11 NOTE — Progress Notes (Signed)
Physical Therapy Treatment Patient Details Name: Bailey Wilson MRN: [id number] DOB: 1940/03/25 Today's Date: 05/11/2017    History of Present Illness 77 y.o. female s/p R TKA 3/27. PMH includes: Carotid Stenosis, GERD, HTN, R THA.     PT Comments    Patient progressing slowly with therapy today, due to sedation. AOx4 however was confused earlier today and unable to work with therapy due to dizziness, VSS, notified RN at time. Patient able to work this evening, ambulating hallway with improving mechanics. Patient likely to require two visits tomorrow as we were unable to initiate stair training today. Will plan to see early tomorrow AM to assess progress. Min guard for OOB mobility at this time.     Follow Up Recommendations  Follow surgeon's recommendation for DC plan and follow-up therapies;Supervision/Assistance - 24 hour     Equipment Recommendations       Recommendations for Other Services       Precautions / Restrictions Precautions Precautions: Fall;Knee Precaution Booklet Issued: Yes (comment) Precaution Comments: reviewed no pillow under knee and supine therex Restrictions Weight Bearing Restrictions: Yes RLE Weight Bearing: Weight bearing as tolerated    Mobility  Bed Mobility               General bed mobility comments: OOB at entry  Transfers Overall transfer level: Needs assistance Equipment used: Rolling walker (2 wheeled) Transfers: Sit to/from Stand Sit to Stand: Min assist;Min guard         General transfer comment:    Ambulation/Gait Ambulation/Gait assistance: Min guard Ambulation Distance (Feet): 150 Feet Assistive device: Rolling walker (2 wheeled) Gait Pattern/deviations: Step-to pattern;Antalgic Gait velocity: decreased   General Gait Details: increased distance, cues for step length  pt able to demo intermittent step through.    Stairs            Wheelchair Mobility    Modified Rankin (Stroke Patients Only)        Balance Overall balance assessment: Needs assistance   Sitting balance-Leahy Scale: Good       Standing balance-Leahy Scale: Fair                              Cognition Arousal/Alertness: Awake/alert Behavior During Therapy: WFL for tasks assessed/performed Overall Cognitive Status: Within Functional Limits for tasks assessed                                        Exercises      General Comments        Pertinent Vitals/Pain      Home Living                      Prior Function            PT Goals (current goals can now be found in the care plan section) Acute Rehab PT Goals Patient Stated Goal: stay with daugther and recover PT Goal Formulation: With patient Time For Goal Achievement: 05/18/17 Potential to Achieve Goals: Good    Frequency    7X/week      PT Plan Current plan remains appropriate    Co-evaluation              AM-PAC PT "6 Clicks" Daily Activity  Outcome Measure  Difficulty turning over in bed (including adjusting bedclothes, sheets and blankets)?: Unable  Difficulty moving from lying on back to sitting on the side of the bed? : Unable Difficulty sitting down on and standing up from a chair with arms (e.g., wheelchair, bedside commode, etc,.)?: Unable Help needed moving to and from a bed to chair (including a wheelchair)?: A Little Help needed walking in hospital room?: A Little Help needed climbing 3-5 steps with a railing? : A Little 6 Click Score: 12    End of Session Equipment Utilized During Treatment: Gait belt Activity Tolerance: Patient tolerated treatment well Patient left: in bed;with call bell/phone within reach;with family/visitor present Nurse Communication: Mobility status PT Visit Diagnosis: Unsteadiness on feet (R26.81);Other abnormalities of gait and mobility (R26.89);Muscle weakness (generalized) (M62.81);Pain Pain - Right/Left: Right Pain - part of body: Knee     Time:  0263-7858 PT Time Calculation (min) (ACUTE ONLY): 25 min  Charges:  $Gait Training: 23-37 mins                    G Codes:       Reinaldo Berber, PT, DPT Acute Rehab Services Pager: [phone]     Reinaldo Berber 05/11/2017, 6:14 PM

## 2017-05-12 LAB — CBC
HCT: 26.4 % — ABNORMAL LOW (ref 36.0–46.0)
Hemoglobin: 8.7 g/dL — ABNORMAL LOW (ref 12.0–15.0)
MCH: 27 pg (ref 26.0–34.0)
MCHC: 33 g/dL (ref 30.0–36.0)
MCV: 82 fL (ref 78.0–100.0)
PLATELETS: 205 10*3/uL (ref 150–400)
RBC: 3.22 MIL/uL — ABNORMAL LOW (ref 3.87–5.11)
RDW: 13.4 % (ref 11.5–15.5)
WBC: 10.3 10*3/uL (ref 4.0–10.5)

## 2017-05-12 MED ORDER — TRAMADOL HCL 50 MG PO TABS: 50.0000 mg | ORAL_TABLET | Freq: Four times a day (QID) | ORAL | 0 refills | Status: DC | PRN

## 2017-05-12 NOTE — Progress Notes (Signed)
Physical Therapy Treatment Patient Details Name: Bailey Wilson MRN: [id number] DOB: 07-13-1940 Today's Date: 05/12/2017    History of Present Illness 77 y.o. female s/p R TKA 3/27. PMH includes: Carotid Stenosis, GERD, HTN, R THA.     PT Comments    Patient progressing well with therapy this morning. Increased ambulation distance now supervision level. Introduced IT trainer patient requiring cues for sequencing during session, will reinforce therex and stairs once more today prior to safe return home once medically cleared.  Min guard on stairs this visit, daughter present and instructed for proper guarding.    Follow Up Recommendations  Follow surgeon's recommendation for DC plan and follow-up therapies;Supervision/Assistance - 24 hour     Equipment Recommendations       Recommendations for Other Services       Precautions / Restrictions Precautions Precautions: Fall;Knee Precaution Booklet Issued: Yes (comment) Precaution Comments: reviewed no pillow under knee and supine therex Restrictions Weight Bearing Restrictions: Yes RLE Weight Bearing: Weight bearing as tolerated    Mobility  Bed Mobility               General bed mobility comments: OOB at entry  Transfers Overall transfer level: Needs assistance Equipment used: Rolling walker (2 wheeled) Transfers: Sit to/from Stand Sit to Stand: Supervision         General transfer comment: increased time and effort, supervision for safety.   Ambulation/Gait Ambulation/Gait assistance: Supervision Ambulation Distance (Feet): 400 Feet Assistive device: Rolling walker (2 wheeled) Gait Pattern/deviations: Step-to pattern;Antalgic;Step-through pattern Gait velocity: decreased   General Gait Details: imporoved distance and independence from prior visit, cues for intermittent step through.    Stairs Stairs: Yes   Stair Management: Two rails;Step to pattern Number of Stairs: 12 General stair comments: cues  for sequencing needed x4 during session, 12 steps. will do 1 more session before d/c to reinforce. Min guard for safety. no LOB  Wheelchair Mobility    Modified Rankin (Stroke Patients Only)       Balance Overall balance assessment: Needs assistance   Sitting balance-Leahy Scale: Good       Standing balance-Leahy Scale: Fair                              Cognition Arousal/Alertness: Awake/alert Behavior During Therapy: WFL for tasks assessed/performed Overall Cognitive Status: Within Functional Limits for tasks assessed                                        Exercises Total Joint Exercises Ankle Circles/Pumps: 20 reps Quad Sets: 10 reps Heel Slides: 10 reps Long Arc Quad: 10 reps Knee Flexion: 10 reps    General Comments        Pertinent Vitals/Pain      Home Living                      Prior Function            PT Goals (current goals can now be found in the care plan section) Acute Rehab PT Goals Patient Stated Goal: stay with daugther and recover PT Goal Formulation: With patient Time For Goal Achievement: 05/18/17 Potential to Achieve Goals: Good    Frequency    7X/week      PT Plan Current plan remains appropriate    Co-evaluation  AM-PAC PT "6 Clicks" Daily Activity  Outcome Measure  Difficulty turning over in bed (including adjusting bedclothes, sheets and blankets)?: Unable Difficulty moving from lying on back to sitting on the side of the bed? : Unable Difficulty sitting down on and standing up from a chair with arms (e.g., wheelchair, bedside commode, etc,.)?: Unable Help needed moving to and from a bed to chair (including a wheelchair)?: A Little Help needed walking in hospital room?: A Little Help needed climbing 3-5 steps with a railing? : A Little 6 Click Score: 12    End of Session Equipment Utilized During Treatment: Gait belt Activity Tolerance: Patient tolerated  treatment well Patient left: in bed;with call bell/phone within reach;with family/visitor present Nurse Communication: Mobility status PT Visit Diagnosis: Unsteadiness on feet (R26.81);Other abnormalities of gait and mobility (R26.89);Muscle weakness (generalized) (M62.81);Pain Pain - Right/Left: Right Pain - part of body: Knee     Time: 0820-0900 PT Time Calculation (min) (ACUTE ONLY): 40 min  Charges:  $Gait Training: 23-37 mins                    G Codes:     Reinaldo Berber, PT, DPT Acute Rehab Services Pager: [phone]     Reinaldo Berber 05/12/2017, 9:02 AM

## 2017-05-12 NOTE — Progress Notes (Signed)
RN Gave pt and daughter discharge instructions and removed her IV. Pt comfortable and ready to DC

## 2017-05-12 NOTE — Progress Notes (Signed)
PATIENT ID: Bailey Wilson  MRN: [id number]  DOB/AGE:  23-Mar-1940 / 77 y.o.  2 Days Post-Op Procedure(s) (LRB): RIGHT TOTAL KNEE ARTHROPLASTY (Right)    PROGRESS NOTE Subjective: Patient is alert, oriented, no Nausea, no Vomiting, yes passing gas. Taking PO well. Denies SOB, Chest or Calf Pain. Using Incentive Spirometer, PAS in place. Ambulate WBAT with pt walking 400 ft with therapy, Patient reports pain as mild to moderate .    Objective: Vital signs in last 24 hours: Vitals:   05/11/17 0535 05/11/17 1444 05/11/17 2017 05/12/17 0432  BP: 137/60 107/69 123/62 (!) 125/58  Pulse: 76 80 90 (!) 102  Resp: 18 16 16 16   Temp: 98 F (36.7 C) 98.1 F (36.7 C) 97.6 F (36.4 C) 99.1 F (37.3 C)  TempSrc: Oral Oral Oral Oral  SpO2: 100% 98% 100% 97%  Weight:      Height:          Intake/Output from previous day: I/O last 3 completed shifts: In: 2300 [P.O.:1300; I.V.:1000] Out: 350 [Urine:350]   Intake/Output this shift: No intake/output data recorded.   LABORATORY DATA: Recent Labs    05/11/17 0526 05/12/17 0618  WBC 8.6 10.3  HGB 9.6* 8.7*  HCT 28.8* 26.4*  PLT 222 205  NA 132*  --   K 4.2  --   CL 96*  --   CO2 27  --   BUN 9  --   CREATININE 0.84  --   GLUCOSE 147*  --   CALCIUM 9.5  --     Examination: Neurologically intact Neurovascular intact Sensation intact distally Intact pulses distally Dorsiflexion/Plantar flexion intact Incision: dressing C/D/I and no drainage No cellulitis present Compartment soft}  Assessment:   2 Days Post-Op Procedure(s) (LRB): RIGHT TOTAL KNEE ARTHROPLASTY (Right) ADDITIONAL DIAGNOSIS: Expected Acute Blood Loss Anemia, Hypertension, heart murmur Anticipated LOS equal to or greater than 2 midnights due to - Age 78 and older with one or more of the following:  - Obesity  - Expected need for hospital services (PT, OT, Nursing) required for safe discharge    - Active co-morbidities: Cardiac Arrhythmia and Anemia OR   -  Unanticipated findings during/Post Surgery: None      Plan: PT/OT WBAT, AROM and PROM  DVT Prophylaxis:  SCDx72hrs, Eliquis 2.5 mg BID x 2 weeks DISCHARGE PLAN: Home DISCHARGE NEEDS: HHPT, Walker and 3-in-1 comode seat     Joanell Rising 05/12/2017, 9:08 AM

## 2017-05-12 NOTE — Progress Notes (Signed)
Physical Therapy Treatment and Discharge Patient Details Name: Bailey Wilson MRN: [id number] DOB: 11/25/1940 Today's Date: 05/12/2017    History of Present Illness 77 y.o. female s/p R TKA 3/27. PMH includes: Carotid Stenosis, GERD, HTN, R THA.     PT Comments    Patient has done well with therapy. Reinforced stairs with patient and daughter, daughter will continue to cue and guard patient at home today after d/c. Reviewed HEP and dosage with daughter to ensure cary over as patient has been displaying short term memory deficits. Pt and family educated on safety considerations for home and have no further questions or concerns at this time. Pt has met all functional goals.      Follow Up Recommendations  Follow surgeon's recommendation for DC plan and follow-up therapies;Supervision/Assistance - 24 hour     Equipment Recommendations       Recommendations for Other Services       Precautions / Restrictions Precautions Precautions: Fall;Knee Precaution Booklet Issued: Yes (comment) Precaution Comments: reviewed no pillow under knee and supine therex Restrictions Weight Bearing Restrictions: Yes RLE Weight Bearing: Weight bearing as tolerated    Mobility  Bed Mobility               General bed mobility comments: OOB at entry  Transfers Overall transfer level: Needs assistance Equipment used: Rolling walker (2 wheeled) Transfers: Sit to/from Stand Sit to Stand: Supervision         General transfer comment: increased time and effort, supervision for safety.   Ambulation/Gait Ambulation/Gait assistance: Supervision Ambulation Distance (Feet): 400 Feet Assistive device: Rolling walker (2 wheeled) Gait Pattern/deviations: Step-to pattern;Antalgic;Step-through pattern Gait velocity: decreased   General Gait Details: imporoved distance and independence from prior visit, cues for intermittent step through.    Stairs Stairs: Yes   Stair Management: Two  rails;Step to pattern Number of Stairs: 16 General stair comments: daughter able to cue patient safely on correct sequencing as she tends to forget. safe with supervision, daughter reports confidence in guarding patient  Wheelchair Mobility    Modified Rankin (Stroke Patients Only)       Balance Overall balance assessment: Needs assistance   Sitting balance-Leahy Scale: Good       Standing balance-Leahy Scale: Fair                              Cognition Arousal/Alertness: Awake/alert Behavior During Therapy: WFL for tasks assessed/performed Overall Cognitive Status: Within Functional Limits for tasks assessed                                        Exercises Total Joint Exercises Ankle Circles/Pumps: 20 reps Quad Sets: 10 reps Heel Slides: 10 reps Long Arc Quad: 10 reps Knee Flexion: 10 reps    General Comments        Pertinent Vitals/Pain      Home Living                      Prior Function            PT Goals (current goals can now be found in the care plan section) Acute Rehab PT Goals Patient Stated Goal: stay with daugther and recover PT Goal Formulation: With patient Time For Goal Achievement: 05/18/17 Potential to Achieve Goals: Good    Frequency  7X/week      PT Plan Current plan remains appropriate    Co-evaluation              AM-PAC PT "6 Clicks" Daily Activity  Outcome Measure  Difficulty turning over in bed (including adjusting bedclothes, sheets and blankets)?: A Lot Difficulty moving from lying on back to sitting on the side of the bed? : A Lot Difficulty sitting down on and standing up from a chair with arms (e.g., wheelchair, bedside commode, etc,.)?: A Lot Help needed moving to and from a bed to chair (including a wheelchair)?: A Little Help needed walking in hospital room?: A Little Help needed climbing 3-5 steps with a railing? : A Little 6 Click Score: 15    End of Session  Equipment Utilized During Treatment: Gait belt Activity Tolerance: Patient tolerated treatment well Patient left: in bed;with call bell/phone within reach;with family/visitor present Nurse Communication: Mobility status PT Visit Diagnosis: Unsteadiness on feet (R26.81);Other abnormalities of gait and mobility (R26.89);Muscle weakness (generalized) (M62.81);Pain Pain - Right/Left: Right Pain - part of body: Knee     Time: 1015-1040 PT Time Calculation (min) (ACUTE ONLY): 25 min  Charges:  $Gait Training: 8-22 mins                    G Codes:       Reinaldo Berber, PT, DPT Acute Rehab Services Pager: [phone]     Reinaldo Berber 05/12/2017, 10:53 AM

## 2017-05-12 NOTE — Discharge Summary (Signed)
Patient ID: Bailey Wilson MRN: [id number] DOB/AGE: 1941-01-04 77 y.o.  Admit date: 05/10/2017 Discharge date: 05/12/2017  Admission Diagnoses:  Active Problems:   Osteoarthritis of right knee   Primary osteoarthritis of right knee   Discharge Diagnoses:  Same  Past Medical History:  Diagnosis Date  . Anemia   . Arthritis    "qwhere/Dr. Noemi Chapel" (07/04/2016)  . Asthma    "been told I've had it; been told I don't" (07/04/2016)  . Carotid stenosis 06/03/2016   1-39% bilateral ICA stenosis 05/2016.  . Environmental and seasonal allergies   . Family history of adverse reaction to anesthesia    son may have had sleep apnea had problem waking him up  . GERD (gastroesophageal reflux disease)   . Hypertension   . Murmur 05/06/2016  . Pneumonia 1980s X1  . Primary localized osteoarthritis of right knee     Surgeries: Procedure(s): RIGHT TOTAL KNEE ARTHROPLASTY on 05/10/2017   Consultants:   Discharged Condition: Improved  Hospital Course: Bailey Wilson is an 77 y.o. female who was admitted 05/10/2017 for operative treatment of<principal problem not specified>. Patient has severe unremitting pain that affects sleep, daily activities, and work/hobbies. After pre-op clearance the patient was taken to the operating room on 05/10/2017 and underwent  Procedure(s): RIGHT TOTAL KNEE ARTHROPLASTY.    Patient was given perioperative antibiotics:  Anti-infectives (From admission, onward)   Start     Dose/Rate Route Frequency Ordered Stop   05/10/17 1051  vancomycin (VANCOCIN) IVPB 1000 mg/200 mL premix     1,000 mg 200 mL/hr over 60 Minutes Intravenous On call to O.R. 05/10/17 1051 05/10/17 1206       Patient was given sequential compression devices, early ambulation, and chemoprophylaxis to prevent DVT.  Patient benefited maximally from hospital stay and there were no complications.    Recent vital signs:  Patient Vitals for the past 24 hrs:  BP Temp Temp src Pulse Resp SpO2  05/12/17  0432 (!) 125/58 99.1 F (37.3 C) Oral (!) 102 16 97 %  05/11/17 2017 123/62 97.6 F (36.4 C) Oral 90 16 100 %  05/11/17 1444 107/69 98.1 F (36.7 C) Oral 80 16 98 %     Recent laboratory studies:  Recent Labs    05/11/17 0526 05/12/17 0618  WBC 8.6 10.3  HGB 9.6* 8.7*  HCT 28.8* 26.4*  PLT 222 205  NA 132*  --   K 4.2  --   CL 96*  --   CO2 27  --   BUN 9  --   CREATININE 0.84  --   GLUCOSE 147*  --   CALCIUM 9.5  --      Discharge Medications:   Allergies as of 05/12/2017      Reactions   Penicillins Hives, Itching   Has patient had a PCN reaction causing immediate rash, facial/tongue/throat swelling, SOB or lightheadedness with hypotension: YES Has patient had a PCN reaction causing severe rash involving mucus membranes or skin necrosis: No Has patient had a PCN reaction that required hospitalization No Has patient had a PCN reaction occurring within the last 10 years: No If all of the above answers are "NO", then may proceed with Cephalosporin use.   Ace Inhibitors Cough   Aspirin Other (See Comments)   Heart racing (large doses)   Sulfa Antibiotics Itching, Rash      Medication List    STOP taking these medications   acetaminophen 500 MG tablet Commonly known as:  TYLENOL  TAKE these medications   albuterol 108 (90 Base) MCG/ACT inhaler Commonly known as:  PROVENTIL HFA;VENTOLIN HFA Inhale 2 puffs into the lungs every 6 (six) hours as needed for wheezing or shortness of breath.   amLODipine 5 MG tablet Commonly known as:  NORVASC Take 5 mg by mouth daily.   apixaban 2.5 MG Tabs tablet Commonly known as:  ELIQUIS Take 1 tablet (2.5 mg total) by mouth 2 (two) times daily.   ARTIFICIAL TEAR OP Apply 2 drops to eye daily.   AZO-CRANBERRY PO Take 2 tablets by mouth daily.   cetirizine 10 MG tablet Commonly known as:  ZYRTEC Take 10 mg by mouth daily.   docusate sodium 100 MG capsule Commonly known as:  COLACE Take 100 mg by mouth daily as  needed for mild constipation.   hydrochlorothiazide 25 MG tablet Commonly known as:  HYDRODIURIL Take 25 mg by mouth daily.   ICAPS MV PO Take 1 capsule by mouth daily.   KP FERROUS SULFATE 325 (65 FE) MG tablet Generic drug:  ferrous sulfate Take 325 mg by mouth daily as needed (low iron).   oxyCODONE-acetaminophen 5-325 MG tablet Commonly known as:  PERCOCET/ROXICET Take 1 tablet by mouth every 4 (four) hours as needed for severe pain.   ranitidine 150 MG tablet Commonly known as:  ZANTAC Take 150 mg by mouth 2 (two) times daily.   sodium chloride 0.65 % Soln nasal spray Commonly known as:  OCEAN Place 1 spray into both nostrils as needed for congestion.   tiZANidine 2 MG tablet Commonly known as:  ZANAFLEX Take 1 tablet (2 mg total) by mouth every 6 (six) hours as needed for muscle spasms.   traMADol 50 MG tablet Commonly known as:  ULTRAM Take 1 tablet (50 mg total) by mouth every 6 (six) hours as needed.            Durable Medical Equipment  (From admission, onward)        Start     Ordered   05/10/17 1722  DME Walker rolling  Once    Question:  Patient needs a walker to treat with the following condition  Answer:  Status post right knee replacement   05/10/17 1721   05/10/17 1722  DME 3 n 1  Once     05/10/17 1721       Discharge Care Instructions  (From admission, onward)        Start     Ordered   05/12/17 0000  Weight bearing as tolerated     05/12/17 0913      Diagnostic Studies: Dg Chest 2 View  Result Date: 05/01/2017 CLINICAL DATA:  Preoperative examination prior to total knee joint replacement. History of asthma, cardiac murmur, hypertension, previous episodes of pneumonia. Nonsmoker. EXAM: CHEST - 2 VIEW COMPARISON:  Chest x-ray of April 22, 2016 FINDINGS: The lungs are adequately inflated and clear. The heart and pulmonary vascularity are normal. There is calcification in the wall of the tortuous descending thoracic aorta. There is  chronic compression of the body of approximately T4. IMPRESSION: There is no active cardiopulmonary disease. Thoracic aortic atherosclerosis. Chronic 50% anterior compression fracture of T4. Electronically Signed   By: David  Martinique M.D.   On: 05/01/2017 13:54    Disposition: Discharge disposition: 01-Home or Self Care       Discharge Instructions    Call MD / Call 911   Complete by:  As directed    If you experience chest pain  or shortness of breath, CALL 911 and be transported to the hospital emergency room.  If you develope a fever above 101 F, pus (white drainage) or increased drainage or redness at the wound, or calf pain, call your surgeon's office.   Constipation Prevention   Complete by:  As directed    Drink plenty of fluids.  Prune juice may be helpful.  You may use a stool softener, such as Colace (over the counter) 100 mg twice a day.  Use MiraLax (over the counter) for constipation as needed.   Diet - low sodium heart healthy   Complete by:  As directed    Driving restrictions   Complete by:  As directed    No driving for 2 weeks   Increase activity slowly as tolerated   Complete by:  As directed    Patient may shower   Complete by:  As directed    You may shower without a dressing once there is no drainage.  Do not wash over the wound.  If drainage remains, cover wound with plastic wrap and then shower.   Weight bearing as tolerated   Complete by:  As directed       Follow-up Information    Frederik Pear, MD In 2 weeks.   Specialty:  Orthopedic Surgery Contact information: [place] [address] [phone]            Signed: Joanell Rising 05/12/2017, 9:13 AM

## 2017-05-12 NOTE — Care Management Note (Signed)
Case Management Note  Patient Details  Name: Bailey Wilson MRN: [id number] Date of Birth: [birth year]  Subjective/Objective:   77 yr old female s/p right total knee arthroplasty.                 Action/Plan: Case manager spoke with patient and her daughter Bailey Wilson concerning discharge plan. Patient will be going to her daughters home - [address]. [place], [place]  [address] for recovery. [name] has arranged for her mother to have outpatient therapy at [facility] in [place]. Case manager will fax orders, OP note, PT notes, H&P to [facility] at [phone].  Expected Discharge Date:  05/12/17               Expected Discharge Plan:  [place]  In-House Referral:     Discharge planning Services  CM Consult  Post Acute Care Choice:    Choice offered to:  Patient  DME Arranged:  N/A(has RW,3in1 ) DME Agency:  NA  HH Arranged:  NA(patient going to outpatient therapy.) HH Agency:  NA  Status of Service:  Completed, signed off  If discussed at [place] of Stay Meetings, dates discussed:    Additional Comments:  Ninfa Meeker, RN 05/12/2017, 11:02 AM

## 2017-05-17 DIAGNOSIS — Z96651 Presence of right artificial knee joint: Secondary | ICD-10-CM | POA: Diagnosis not present

## 2017-05-18 DIAGNOSIS — Z96651 Presence of right artificial knee joint: Secondary | ICD-10-CM | POA: Diagnosis not present

## 2017-05-22 DIAGNOSIS — Z96651 Presence of right artificial knee joint: Secondary | ICD-10-CM | POA: Diagnosis not present

## 2017-05-23 DIAGNOSIS — M1711 Unilateral primary osteoarthritis, right knee: Secondary | ICD-10-CM | POA: Diagnosis not present

## 2017-05-23 DIAGNOSIS — Z471 Aftercare following joint replacement surgery: Secondary | ICD-10-CM | POA: Diagnosis not present

## 2017-05-23 DIAGNOSIS — Z96651 Presence of right artificial knee joint: Secondary | ICD-10-CM | POA: Diagnosis not present

## 2017-05-24 DIAGNOSIS — Z96651 Presence of right artificial knee joint: Secondary | ICD-10-CM | POA: Diagnosis not present

## 2017-05-26 ENCOUNTER — Encounter (HOSPITAL_COMMUNITY): Payer: Self-pay

## 2017-05-26 DIAGNOSIS — Z96651 Presence of right artificial knee joint: Secondary | ICD-10-CM | POA: Diagnosis not present

## 2017-05-29 DIAGNOSIS — Z96651 Presence of right artificial knee joint: Secondary | ICD-10-CM | POA: Diagnosis not present

## 2017-05-31 DIAGNOSIS — Z96651 Presence of right artificial knee joint: Secondary | ICD-10-CM | POA: Diagnosis not present

## 2017-06-02 DIAGNOSIS — Z96651 Presence of right artificial knee joint: Secondary | ICD-10-CM | POA: Diagnosis not present

## 2017-06-05 DIAGNOSIS — Z96651 Presence of right artificial knee joint: Secondary | ICD-10-CM | POA: Diagnosis not present

## 2017-06-08 DIAGNOSIS — Z96651 Presence of right artificial knee joint: Secondary | ICD-10-CM | POA: Diagnosis not present

## 2017-06-12 DIAGNOSIS — Z96651 Presence of right artificial knee joint: Secondary | ICD-10-CM | POA: Diagnosis not present

## 2017-06-15 DIAGNOSIS — Z96651 Presence of right artificial knee joint: Secondary | ICD-10-CM | POA: Diagnosis not present

## 2017-06-19 DIAGNOSIS — Z96651 Presence of right artificial knee joint: Secondary | ICD-10-CM | POA: Diagnosis not present

## 2017-06-21 DIAGNOSIS — L570 Actinic keratosis: Secondary | ICD-10-CM | POA: Diagnosis not present

## 2017-06-21 DIAGNOSIS — M25561 Pain in right knee: Secondary | ICD-10-CM | POA: Diagnosis not present

## 2017-06-21 DIAGNOSIS — D0462 Carcinoma in situ of skin of left upper limb, including shoulder: Secondary | ICD-10-CM | POA: Diagnosis not present

## 2017-06-21 DIAGNOSIS — C44222 Squamous cell carcinoma of skin of right ear and external auricular canal: Secondary | ICD-10-CM | POA: Diagnosis not present

## 2017-06-21 DIAGNOSIS — X32XXXD Exposure to sunlight, subsequent encounter: Secondary | ICD-10-CM | POA: Diagnosis not present

## 2017-06-30 ENCOUNTER — Ambulatory Visit (HOSPITAL_COMMUNITY)
Admission: RE | Admit: 2017-06-30 | Discharge: 2017-06-30 | Disposition: A | Discharge status: Home / Self Care | Payer: Medicare HMO | Source: Ambulatory Visit | Attending: Cardiovascular Disease | Admitting: Cardiovascular Disease

## 2017-06-30 DIAGNOSIS — I6523 Occlusion and stenosis of bilateral carotid arteries: Secondary | ICD-10-CM | POA: Diagnosis not present

## 2017-07-06 DIAGNOSIS — I951 Orthostatic hypotension: Secondary | ICD-10-CM | POA: Diagnosis not present

## 2017-07-06 DIAGNOSIS — J302 Other seasonal allergic rhinitis: Secondary | ICD-10-CM | POA: Diagnosis not present

## 2017-07-06 DIAGNOSIS — M810 Age-related osteoporosis without current pathological fracture: Secondary | ICD-10-CM | POA: Diagnosis not present

## 2017-07-06 DIAGNOSIS — D649 Anemia, unspecified: Secondary | ICD-10-CM | POA: Diagnosis not present

## 2017-07-06 DIAGNOSIS — I10 Essential (primary) hypertension: Secondary | ICD-10-CM | POA: Diagnosis not present

## 2017-07-06 DIAGNOSIS — K5900 Constipation, unspecified: Secondary | ICD-10-CM | POA: Diagnosis not present

## 2017-07-06 DIAGNOSIS — K08109 Complete loss of teeth, unspecified cause, unspecified class: Secondary | ICD-10-CM | POA: Diagnosis not present

## 2017-07-06 DIAGNOSIS — K219 Gastro-esophageal reflux disease without esophagitis: Secondary | ICD-10-CM | POA: Diagnosis not present

## 2017-07-06 DIAGNOSIS — R609 Edema, unspecified: Secondary | ICD-10-CM | POA: Diagnosis not present

## 2017-07-06 DIAGNOSIS — R269 Unspecified abnormalities of gait and mobility: Secondary | ICD-10-CM | POA: Diagnosis not present

## 2017-07-12 ENCOUNTER — Telehealth: Payer: Self-pay | Admitting: Cardiovascular Disease

## 2017-07-12 NOTE — Telephone Encounter (Signed)
ollow Up:    Pt returning call from yesterday.She said she did not know who called her.

## 2017-07-12 NOTE — Telephone Encounter (Signed)
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  Unchanged from prior.  No changes recommended at this time.

## 2017-07-12 NOTE — Telephone Encounter (Signed)
Advised patient of results.  

## 2017-07-19 DIAGNOSIS — X32XXXD Exposure to sunlight, subsequent encounter: Secondary | ICD-10-CM | POA: Diagnosis not present

## 2017-07-19 DIAGNOSIS — C44622 Squamous cell carcinoma of skin of right upper limb, including shoulder: Secondary | ICD-10-CM | POA: Diagnosis not present

## 2017-07-19 DIAGNOSIS — Z85828 Personal history of other malignant neoplasm of skin: Secondary | ICD-10-CM | POA: Diagnosis not present

## 2017-07-19 DIAGNOSIS — Z08 Encounter for follow-up examination after completed treatment for malignant neoplasm: Secondary | ICD-10-CM | POA: Diagnosis not present

## 2017-07-19 DIAGNOSIS — L570 Actinic keratosis: Secondary | ICD-10-CM | POA: Diagnosis not present

## 2017-07-27 DIAGNOSIS — J22 Unspecified acute lower respiratory infection: Secondary | ICD-10-CM | POA: Diagnosis not present

## 2017-07-27 DIAGNOSIS — Z0001 Encounter for general adult medical examination with abnormal findings: Secondary | ICD-10-CM | POA: Diagnosis not present

## 2017-07-27 DIAGNOSIS — E663 Overweight: Secondary | ICD-10-CM | POA: Diagnosis not present

## 2017-07-27 DIAGNOSIS — Z6827 Body mass index (BMI) 27.0-27.9, adult: Secondary | ICD-10-CM | POA: Diagnosis not present

## 2017-07-28 DIAGNOSIS — Z0001 Encounter for general adult medical examination with abnormal findings: Secondary | ICD-10-CM | POA: Diagnosis not present

## 2017-07-28 DIAGNOSIS — Z0000 Encounter for general adult medical examination without abnormal findings: Secondary | ICD-10-CM | POA: Diagnosis not present

## 2017-07-28 DIAGNOSIS — Z1389 Encounter for screening for other disorder: Secondary | ICD-10-CM | POA: Diagnosis not present

## 2017-07-28 DIAGNOSIS — J22 Unspecified acute lower respiratory infection: Secondary | ICD-10-CM | POA: Diagnosis not present

## 2017-07-28 DIAGNOSIS — Z136 Encounter for screening for cardiovascular disorders: Secondary | ICD-10-CM | POA: Diagnosis not present

## 2017-09-01 DIAGNOSIS — Z961 Presence of intraocular lens: Secondary | ICD-10-CM | POA: Diagnosis not present

## 2017-09-01 DIAGNOSIS — H353131 Nonexudative age-related macular degeneration, bilateral, early dry stage: Secondary | ICD-10-CM | POA: Diagnosis not present

## 2017-09-01 DIAGNOSIS — H52203 Unspecified astigmatism, bilateral: Secondary | ICD-10-CM | POA: Diagnosis not present

## 2017-09-01 DIAGNOSIS — H524 Presbyopia: Secondary | ICD-10-CM | POA: Diagnosis not present

## 2017-10-05 ENCOUNTER — Other Ambulatory Visit (HOSPITAL_COMMUNITY): Payer: Self-pay | Admitting: Family Medicine

## 2017-10-05 DIAGNOSIS — Z1231 Encounter for screening mammogram for malignant neoplasm of breast: Secondary | ICD-10-CM

## 2017-10-24 DIAGNOSIS — Z85828 Personal history of other malignant neoplasm of skin: Secondary | ICD-10-CM | POA: Diagnosis not present

## 2017-10-24 DIAGNOSIS — Z08 Encounter for follow-up examination after completed treatment for malignant neoplasm: Secondary | ICD-10-CM | POA: Diagnosis not present

## 2017-10-24 DIAGNOSIS — L820 Inflamed seborrheic keratosis: Secondary | ICD-10-CM | POA: Diagnosis not present

## 2017-10-24 DIAGNOSIS — X32XXXD Exposure to sunlight, subsequent encounter: Secondary | ICD-10-CM | POA: Diagnosis not present

## 2017-10-24 DIAGNOSIS — L570 Actinic keratosis: Secondary | ICD-10-CM | POA: Diagnosis not present

## 2017-11-06 ENCOUNTER — Ambulatory Visit (HOSPITAL_COMMUNITY): Payer: Self-pay

## 2017-11-08 ENCOUNTER — Ambulatory Visit (HOSPITAL_COMMUNITY)
Admission: RE | Admit: 2017-11-08 | Discharge: 2017-11-08 | Disposition: A | Discharge status: Home / Self Care | Payer: Medicare HMO | Source: Ambulatory Visit | Attending: Family Medicine | Admitting: Family Medicine

## 2017-11-08 DIAGNOSIS — Z1231 Encounter for screening mammogram for malignant neoplasm of breast: Secondary | ICD-10-CM | POA: Diagnosis not present

## 2017-11-21 DIAGNOSIS — M25561 Pain in right knee: Secondary | ICD-10-CM | POA: Diagnosis not present

## 2017-12-06 DIAGNOSIS — Z6828 Body mass index (BMI) 28.0-28.9, adult: Secondary | ICD-10-CM | POA: Diagnosis not present

## 2017-12-06 DIAGNOSIS — E663 Overweight: Secondary | ICD-10-CM | POA: Diagnosis not present

## 2017-12-06 DIAGNOSIS — J069 Acute upper respiratory infection, unspecified: Secondary | ICD-10-CM | POA: Diagnosis not present

## 2017-12-19 ENCOUNTER — Encounter (HOSPITAL_COMMUNITY): Payer: Self-pay | Admitting: Emergency Medicine

## 2017-12-19 ENCOUNTER — Other Ambulatory Visit: Payer: Self-pay

## 2017-12-19 ENCOUNTER — Emergency Department (HOSPITAL_COMMUNITY)
Admission: EM | Admit: 2017-12-19 | Discharge: 2017-12-19 | Disposition: A | Discharge status: Home / Self Care | Payer: Medicare HMO | Attending: Emergency Medicine | Admitting: Emergency Medicine

## 2017-12-19 ENCOUNTER — Emergency Department (HOSPITAL_COMMUNITY): Payer: Medicare HMO

## 2017-12-19 DIAGNOSIS — Y92481 Parking lot as the place of occurrence of the external cause: Secondary | ICD-10-CM | POA: Insufficient documentation

## 2017-12-19 DIAGNOSIS — S79911A Unspecified injury of right hip, initial encounter: Secondary | ICD-10-CM | POA: Diagnosis not present

## 2017-12-19 DIAGNOSIS — I10 Essential (primary) hypertension: Secondary | ICD-10-CM | POA: Diagnosis not present

## 2017-12-19 DIAGNOSIS — Z79899 Other long term (current) drug therapy: Secondary | ICD-10-CM | POA: Diagnosis not present

## 2017-12-19 DIAGNOSIS — Y998 Other external cause status: Secondary | ICD-10-CM | POA: Diagnosis not present

## 2017-12-19 DIAGNOSIS — J45909 Unspecified asthma, uncomplicated: Secondary | ICD-10-CM | POA: Insufficient documentation

## 2017-12-19 DIAGNOSIS — W19XXXA Unspecified fall, initial encounter: Secondary | ICD-10-CM

## 2017-12-19 DIAGNOSIS — S5001XA Contusion of right elbow, initial encounter: Secondary | ICD-10-CM | POA: Diagnosis not present

## 2017-12-19 DIAGNOSIS — S59901A Unspecified injury of right elbow, initial encounter: Secondary | ICD-10-CM | POA: Diagnosis not present

## 2017-12-19 DIAGNOSIS — S6991XA Unspecified injury of right wrist, hand and finger(s), initial encounter: Secondary | ICD-10-CM | POA: Diagnosis not present

## 2017-12-19 DIAGNOSIS — S8991XA Unspecified injury of right lower leg, initial encounter: Secondary | ICD-10-CM | POA: Diagnosis not present

## 2017-12-19 DIAGNOSIS — Y9389 Activity, other specified: Secondary | ICD-10-CM | POA: Insufficient documentation

## 2017-12-19 DIAGNOSIS — Z7901 Long term (current) use of anticoagulants: Secondary | ICD-10-CM | POA: Insufficient documentation

## 2017-12-19 DIAGNOSIS — W010XXA Fall on same level from slipping, tripping and stumbling without subsequent striking against object, initial encounter: Secondary | ICD-10-CM | POA: Diagnosis not present

## 2017-12-19 DIAGNOSIS — M25551 Pain in right hip: Secondary | ICD-10-CM

## 2017-12-19 DIAGNOSIS — R69 Illness, unspecified: Secondary | ICD-10-CM | POA: Diagnosis not present

## 2017-12-19 DIAGNOSIS — S4991XA Unspecified injury of right shoulder and upper arm, initial encounter: Secondary | ICD-10-CM | POA: Diagnosis not present

## 2017-12-19 MED ORDER — ACETAMINOPHEN 325 MG PO TABS
650.0000 mg | ORAL_TABLET | Freq: Once | ORAL | Status: AC
  Administered 2017-12-19: 650 mg via ORAL
  Filled 2017-12-19: qty 2

## 2017-12-19 NOTE — ED Notes (Signed)
EDP at bedside updating patient and family. 

## 2017-12-19 NOTE — Discharge Instructions (Signed)
Take over the counter tylenol, as directed on packaging, as needed for discomfort.  Apply moist heat or ice to the area(s) of discomfort, for 15 minutes at a time, several times per day for the next few days.  Do not fall asleep on a heating or ice pack.  Call your regular medical doctor tomorrow to schedule a follow up appointment in the next 2 days.  Return to the Emergency Department immediately if worsening.

## 2017-12-19 NOTE — ED Provider Notes (Signed)
[facility] EMERGENCY DEPARTMENT Provider Note   CSN: [id number] Arrival date & time: 12/19/17  1622     History   Chief Complaint Chief Complaint  Patient presents with  . Fall    HPI Bailey Wilson is a 77 y.o. female.  HPI  Pt was seen at 1630. Per pt, c/o sudden onset and resolution of one episode of slip and fall that occurred PTA. Pt states she was out shopping and went to put her things in the car when she slipped and fell onto her right side. Pt states she fell onto her right arm and right hip. Pt c/o right elbow pain and right hip pain. Pt has been ambulatory since the fall. Denies hitting head, no LOC, no AMS, no neck or back pain, no CP/SOB, no abd pain, no focal motor weakness, no tingling/numbness in extremities, no prodromal symptoms before fall.   Td UTD Past Medical History:  Diagnosis Date  . Anemia   . Arthritis    "qwhere/Dr. Noemi Chapel" (07/04/2016)  . Asthma    "been told I've had it; been told I don't" (07/04/2016)  . Carotid stenosis 06/03/2016   1-39% bilateral ICA stenosis 05/2016.  . Environmental and seasonal allergies   . Family history of adverse reaction to anesthesia    son may have had sleep apnea had problem waking him up  . GERD (gastroesophageal reflux disease)   . Hypertension   . Murmur 05/06/2016  . Pneumonia 1980s X1  . Primary localized osteoarthritis of right knee     Patient Active Problem List   Diagnosis Date Noted  . Primary osteoarthritis of right knee 05/10/2017  . 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 Arthritis of right hip 07/04/2016  . Carotid stenosis 06/03/2016  . Murmur 05/06/2016  . Essential hypertension 04/20/2016  . Environmental and seasonal allergies   . Osteoarthritis of right knee     Past Surgical History:  Procedure Laterality Date  . ABDOMINAL HYSTERECTOMY  1985  . CATARACT EXTRACTION W/ INTRAOCULAR LENS  IMPLANT, BILATERAL Bilateral 2002-2007   "left-right"  . COLONOSCOPY    . [place]   "prior to hysterectomy"  . FRACTURE SURGERY    . JOINT REPLACEMENT    . NASAL FRACTURE SURGERY  2016  . REPLACEMENT TOTAL KNEE Right   . TONSILLECTOMY    . 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 TOTAL HIP ARTHROPLASTY Right 07/04/2016   Procedure: TOTAL HIP ARTHROPLASTY ANTERIOR APPROACH;  Surgeon: Frederik Pear, MD;  Location: [facility];  Service: Orthopedics;  Laterality: Right;  . TOTAL KNEE ARTHROPLASTY Right 05/10/2017   Procedure: RIGHT TOTAL KNEE ARTHROPLASTY;  Surgeon: Frederik Pear, MD;  Location: [facility];  Service: Orthopedics;  Laterality: Right;     OB History   None      Home Medications    Prior to Admission medications   Medication Sig Start Date End Date Taking? Authorizing Provider  albuterol (PROVENTIL HFA;VENTOLIN HFA) 108 (90 Base) MCG/ACT inhaler Inhale 2 puffs into the lungs every 6 (six) hours as needed for wheezing or shortness of breath.    [provider]  amLODipine (NORVASC) 5 MG tablet Take 5 mg by mouth daily.    [provider]  apixaban (ELIQUIS) 2.5 MG TABS tablet Take 1 tablet (2.5 mg total) by mouth 2 (two) times daily. 05/10/17   Leighton Parody, PA-C  ARTIFICIAL TEAR OP Apply 2 drops to eye daily.  [provider]  AZO-CRANBERRY PO Take 2 tablets by mouth daily.    [provider]  cetirizine (ZYRTEC) 10 MG tablet Take 10 mg by mouth daily.    [provider]  docusate sodium (COLACE) 100 MG capsule Take 100 mg by mouth daily as needed for mild constipation.     [provider]  ferrous sulfate (KP FERROUS SULFATE) 325 (65 FE) MG tablet Take 325 mg by mouth daily as needed (low iron).     [provider]  hydrochlorothiazide (HYDRODIURIL) 25 MG tablet Take 25 mg by mouth daily.    [provider]  Multiple Vitamins-Minerals (ICAPS MV PO) Take 1 capsule by mouth daily.    [provider]  oxyCODONE-acetaminophen (PERCOCET/ROXICET) 5-325 MG  tablet Take 1 tablet by mouth every 4 (four) hours as needed for severe pain. 05/10/17   Leighton Parody, PA-C  ranitidine (ZANTAC) 150 MG tablet Take 150 mg by mouth 2 (two) times daily.    [provider]  sodium chloride (OCEAN) 0.65 % SOLN nasal spray Place 1 spray into both nostrils as needed for congestion.    [provider]  tiZANidine (ZANAFLEX) 2 MG tablet Take 1 tablet (2 mg total) by mouth every 6 (six) hours as needed for muscle spasms. 05/10/17   Leighton Parody, PA-C  traMADol (ULTRAM) 50 MG tablet Take 1 tablet (50 mg total) by mouth every 6 (six) hours as needed. 05/12/17 05/12/18  Leighton Parody, PA-C    Family History Family History  Problem Relation Age of Onset  . Asthma Mother   . Arrhythmia Mother   . Heart disease Mother   . Heart disease Father   . Clotting disorder Father   . Hypertension Paternal Grandfather   . Blindness Paternal Grandmother     Social History Social History   Tobacco Use  . Smoking status: Never Smoker  . Smokeless tobacco: Never Used  Substance Use Topics  . Alcohol use: No  . Drug use: No     Allergies   Penicillins; Ace inhibitors; Aspirin; and Sulfa antibiotics   Review of Systems Review of Systems ROS: Statement: All systems negative except as marked or noted in the HPI; Constitutional: Negative for fever and chills. ; ; Eyes: Negative for eye pain, redness and discharge. ; ; ENMT: Negative for ear pain, hoarseness, nasal congestion, sinus pressure and sore throat. ; ; Cardiovascular: Negative for chest pain, palpitations, diaphoresis, dyspnea and peripheral edema. ; ; Respiratory: Negative for cough, wheezing and stridor. ; ; Gastrointestinal: Negative for nausea, vomiting, diarrhea, abdominal pain, blood in stool, hematemesis, jaundice and rectal bleeding. . ; ; Genitourinary: Negative for dysuria, flank pain and hematuria. ; ; Musculoskeletal: +right elbow and hip pain. Negative for back pain and neck pain.  Negative for swelling and deformity..; ; Skin: Negative for pruritus, rash, abrasions, blisters, bruising and skin lesion.; ; Neuro: Negative for headache, lightheadedness and neck stiffness. Negative for weakness, altered level of consciousness, altered mental status, extremity weakness, paresthesias, involuntary movement, seizure and syncope.       Physical Exam Updated Vital Signs BP (!) 135/115 (BP Location: Left Arm)   Pulse 88   Temp 98 F (36.7 C) (Tympanic)   Resp 16   Ht 5\' 6"  (1.676 m)   Wt 78 kg   SpO2 94%   BMI 27.76 kg/m   Physical Exam 1635: Physical examination: Vital signs and O2 SAT: Reviewed; Constitutional: Well developed, Well nourished, Well hydrated, In no  acute distress; Head and Face: Normocephalic, Atraumatic; Eyes: EOMI, PERRL, No scleral icterus; ENMT: Mouth and pharynx normal, Left TM normal, Right TM normal, Mucous membranes moist; Neck: Supple, Trachea midline. No abrasions or ecchymosis.; Spine: No midline CS, TS, LS tenderness.; Cardiovascular: Regular rate and rhythm, No gallop; Respiratory: Breath sounds clear & equal bilaterally, No wheezes, Normal respiratory effort/excursion; Chest: Nontender, No deformity, Movement normal, No crepitus, No abrasions or ecchymosis.; Abdomen: Soft, Nontender, Nondistended, Normal bowel sounds, No abrasions or ecchymosis.; Genitourinary: No CVA tenderness;; Extremities: +mild TTP right olecranon area with localized ecchymosis, no deformity, FROM without pain, muscles compartments soft, NMS intact distally RUE. NT right shoulder/wrist/hand. +small ecchymosis noted right dorsal medial wrist. +mild TTP right hip, no deformity. NT right knee/ankle/foot. Full range of motion major/large joints of bilat UE's and LE's without pain or tenderness to palp, Neurovascularly intact, Pulses normal, No deformity. No tenderness, +tr pedal edema bilat. Pelvis stable; Neuro: AA&Ox3, GCS 15.  No facial droop. Major CN grossly intact. Speech clear.  No gross focal motor or sensory deficits in extremities.; Skin: Color normal, Warm, Dry    ED Treatments / Results  Labs (all labs ordered are listed, but only abnormal results are displayed)   EKG None  Radiology   Procedures Procedures (including critical care time)  Medications Ordered in ED Medications - No data to display   Initial Impression / Assessment and Plan / ED Course  I have reviewed the triage vital signs and the nursing notes.  Pertinent labs & imaging results that were available during my care of the patient were reviewed by me and considered in my medical decision making (see chart for details).  MDM Reviewed: previous chart, vitals and nursing note Interpretation: x-ray   Dg Shoulder Right Result Date: 12/19/2017 CLINICAL DATA:  Initial evaluation for acute trauma, fall. EXAM: RIGHT SHOULDER - 2+ VIEW COMPARISON:  None. FINDINGS: No acute fracture or dislocation. Humeral head in normal alignment within the glenoid. AC joint approximated. Mild osteoarthritic changes about the right AC joint. No periarticular calcification. No acute soft tissue abnormality. Partially visualized right hemithorax grossly clear. IMPRESSION: No acute osseous abnormality about the right shoulder. Electronically Signed   By: Jeannine Boga M.D.   On: 12/19/2017 17:34   Dg Elbow Complete Right Result Date: 12/19/2017 CLINICAL DATA:  Initial evaluation for acute trauma, fall. EXAM: RIGHT ELBOW - COMPLETE 3+ VIEW COMPARISON:  None. FINDINGS: There is no evidence of fracture, dislocation, or joint effusion. There is no evidence of arthropathy or other focal bone abnormality. Soft tissues are unremarkable. IMPRESSION: Negative. Electronically Signed   By: Jeannine Boga M.D.   On: 12/19/2017 17:24   Dg Wrist Complete Right Result Date: 12/19/2017 CLINICAL DATA:  Initial evaluation for acute trauma, fall. EXAM: RIGHT WRIST - COMPLETE 3+ VIEW COMPARISON:  None. FINDINGS: No  acute fracture or dislocation. Normal radiocarpal and distal radioulnar articulations maintained. Chondrocalcinosis noted at the level of the TFC. Prominent osteoarthritic changes present at the radial aspect of the hand. Irregular lucencies within the distal radius without associated soft tissue mass or periosteal reaction, likely chronic in nature. No acute soft tissue abnormality. IMPRESSION: 1. No acute osseous abnormality about the right wrist. 2. 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 Electronically Signed   By: Jeannine Boga M.D.   On: 12/19/2017 17:29   Dg Knee Complete 4 Views Right Result Date: 12/19/2017 CLINICAL DATA:  Initial evaluation for acute trauma,  fall. EXAM: RIGHT KNEE - COMPLETE 4+ VIEW COMPARISON:  None. FINDINGS: Right total knee arthroplasty in place. No periprosthetic lucency to suggest loosening or other complication. No acute fracture or dislocation. Small joint effusion noted. No acute soft tissue abnormality. IMPRESSION: 1. No acute osseous abnormality about the right knee. 2. Right total knee arthroplasty in place without complication. Electronically Signed   By: Jeannine Boga M.D.   On: 12/19/2017 17:32   Dg Hip Unilat With Pelvis 2-3 Views Right Result Date: 12/19/2017 CLINICAL DATA:  Initial evaluation for acute trauma, fall. EXAM: DG HIP (WITH OR WITHOUT PELVIS) 2-3V RIGHT COMPARISON:  None. FINDINGS: Right total hip arthroplasty in place. No periprosthetic lucency to suggest loosening or other complication. No acute periprosthetic fracture. Bony pelvis intact. No pubic diastasis. SI joints approximated. Limited views of the left hip demonstrate no acute finding. Prominent degenerative changes noted within the lower lumbar spine. No acute soft tissue abnormality. IMPRESSION: 1. No acute osseous abnormality about the right hip. 2. Right total hip arthroplasty in place  without complication. Electronically Signed   By: Jeannine Boga M.D.   On: 12/19/2017 17:30     1810:  XR reassuring. Tx symptomatically at this time. Dx and testing d/w pt and family.  Questions answered.  Verb understanding, agreeable to d/c home with outpt f/u.   Final Clinical Impressions(s) / ED Diagnoses   Final diagnoses:  None    ED Discharge Orders    None       Francine Graven, DO 12/23/17 0981

## 2017-12-19 NOTE — ED Triage Notes (Signed)
Pt reports she lost her balance trying to get into the car while shopping today and fell onto her R hip and arm. Denies blood thinner use or hitting head.

## 2017-12-19 NOTE — ED Notes (Signed)
Patient transported to X-ray 

## 2017-12-19 NOTE — ED Notes (Signed)
EDP at bedside  

## 2017-12-21 DIAGNOSIS — Z6828 Body mass index (BMI) 28.0-28.9, adult: Secondary | ICD-10-CM | POA: Diagnosis not present

## 2017-12-21 DIAGNOSIS — E663 Overweight: Secondary | ICD-10-CM | POA: Diagnosis not present

## 2017-12-21 DIAGNOSIS — Z1389 Encounter for screening for other disorder: Secondary | ICD-10-CM | POA: Diagnosis not present

## 2017-12-21 DIAGNOSIS — S7000XA Contusion of unspecified hip, initial encounter: Secondary | ICD-10-CM | POA: Diagnosis not present

## 2018-01-02 DIAGNOSIS — M25551 Pain in right hip: Secondary | ICD-10-CM | POA: Diagnosis not present

## 2018-01-02 DIAGNOSIS — M1991 Primary osteoarthritis, unspecified site: Secondary | ICD-10-CM | POA: Diagnosis not present

## 2018-01-02 DIAGNOSIS — Z6828 Body mass index (BMI) 28.0-28.9, adult: Secondary | ICD-10-CM | POA: Diagnosis not present

## 2018-01-02 DIAGNOSIS — E663 Overweight: Secondary | ICD-10-CM | POA: Diagnosis not present

## 2018-02-21 DIAGNOSIS — X32XXXD Exposure to sunlight, subsequent encounter: Secondary | ICD-10-CM | POA: Diagnosis not present

## 2018-02-21 DIAGNOSIS — L570 Actinic keratosis: Secondary | ICD-10-CM | POA: Diagnosis not present

## 2018-02-21 DIAGNOSIS — D0462 Carcinoma in situ of skin of left upper limb, including shoulder: Secondary | ICD-10-CM | POA: Diagnosis not present

## 2018-03-01 ENCOUNTER — Other Ambulatory Visit (HOSPITAL_COMMUNITY): Payer: Self-pay | Admitting: Family Medicine

## 2018-03-01 DIAGNOSIS — E2839 Other primary ovarian failure: Secondary | ICD-10-CM

## 2018-04-04 DIAGNOSIS — X32XXXD Exposure to sunlight, subsequent encounter: Secondary | ICD-10-CM | POA: Diagnosis not present

## 2018-04-04 DIAGNOSIS — Z08 Encounter for follow-up examination after completed treatment for malignant neoplasm: Secondary | ICD-10-CM | POA: Diagnosis not present

## 2018-04-04 DIAGNOSIS — L570 Actinic keratosis: Secondary | ICD-10-CM | POA: Diagnosis not present

## 2018-04-04 DIAGNOSIS — Z85828 Personal history of other malignant neoplasm of skin: Secondary | ICD-10-CM | POA: Diagnosis not present

## 2018-04-16 DIAGNOSIS — Z6828 Body mass index (BMI) 28.0-28.9, adult: Secondary | ICD-10-CM | POA: Diagnosis not present

## 2018-04-16 DIAGNOSIS — E663 Overweight: Secondary | ICD-10-CM | POA: Diagnosis not present

## 2018-04-16 DIAGNOSIS — J22 Unspecified acute lower respiratory infection: Secondary | ICD-10-CM | POA: Diagnosis not present

## 2018-04-16 DIAGNOSIS — J209 Acute bronchitis, unspecified: Secondary | ICD-10-CM | POA: Diagnosis not present

## 2018-06-05 DIAGNOSIS — M25561 Pain in right knee: Secondary | ICD-10-CM | POA: Diagnosis not present

## 2018-07-17 DIAGNOSIS — L570 Actinic keratosis: Secondary | ICD-10-CM | POA: Diagnosis not present

## 2018-07-17 DIAGNOSIS — X32XXXD Exposure to sunlight, subsequent encounter: Secondary | ICD-10-CM | POA: Diagnosis not present

## 2018-08-22 DIAGNOSIS — D0462 Carcinoma in situ of skin of left upper limb, including shoulder: Secondary | ICD-10-CM | POA: Diagnosis not present

## 2018-09-03 DIAGNOSIS — H353131 Nonexudative age-related macular degeneration, bilateral, early dry stage: Secondary | ICD-10-CM | POA: Diagnosis not present

## 2018-09-03 DIAGNOSIS — Z961 Presence of intraocular lens: Secondary | ICD-10-CM | POA: Diagnosis not present

## 2018-09-03 DIAGNOSIS — H5213 Myopia, bilateral: Secondary | ICD-10-CM | POA: Diagnosis not present

## 2018-09-03 DIAGNOSIS — H52203 Unspecified astigmatism, bilateral: Secondary | ICD-10-CM | POA: Diagnosis not present

## 2018-09-03 DIAGNOSIS — H524 Presbyopia: Secondary | ICD-10-CM | POA: Diagnosis not present

## 2018-09-20 DIAGNOSIS — Z1389 Encounter for screening for other disorder: Secondary | ICD-10-CM | POA: Diagnosis not present

## 2018-09-20 DIAGNOSIS — M159 Polyosteoarthritis, unspecified: Secondary | ICD-10-CM | POA: Diagnosis not present

## 2018-09-20 DIAGNOSIS — I10 Essential (primary) hypertension: Secondary | ICD-10-CM | POA: Diagnosis not present

## 2018-09-20 DIAGNOSIS — Z0001 Encounter for general adult medical examination with abnormal findings: Secondary | ICD-10-CM | POA: Diagnosis not present

## 2018-09-20 DIAGNOSIS — Z0000 Encounter for general adult medical examination without abnormal findings: Secondary | ICD-10-CM | POA: Diagnosis not present

## 2018-09-20 DIAGNOSIS — E6609 Other obesity due to excess calories: Secondary | ICD-10-CM | POA: Diagnosis not present

## 2018-09-20 DIAGNOSIS — Z6831 Body mass index (BMI) 31.0-31.9, adult: Secondary | ICD-10-CM | POA: Diagnosis not present

## 2018-09-20 DIAGNOSIS — J45909 Unspecified asthma, uncomplicated: Secondary | ICD-10-CM | POA: Diagnosis not present

## 2018-10-16 ENCOUNTER — Other Ambulatory Visit (HOSPITAL_COMMUNITY): Payer: Self-pay | Admitting: Family Medicine

## 2018-10-16 DIAGNOSIS — Z1231 Encounter for screening mammogram for malignant neoplasm of breast: Secondary | ICD-10-CM

## 2018-10-17 DIAGNOSIS — R05 Cough: Secondary | ICD-10-CM | POA: Diagnosis not present

## 2018-10-17 DIAGNOSIS — R0982 Postnasal drip: Secondary | ICD-10-CM | POA: Diagnosis not present

## 2018-10-17 DIAGNOSIS — Z6831 Body mass index (BMI) 31.0-31.9, adult: Secondary | ICD-10-CM | POA: Diagnosis not present

## 2018-10-17 DIAGNOSIS — E6609 Other obesity due to excess calories: Secondary | ICD-10-CM | POA: Diagnosis not present

## 2018-10-23 DIAGNOSIS — Z08 Encounter for follow-up examination after completed treatment for malignant neoplasm: Secondary | ICD-10-CM | POA: Diagnosis not present

## 2018-10-23 DIAGNOSIS — C44622 Squamous cell carcinoma of skin of right upper limb, including shoulder: Secondary | ICD-10-CM | POA: Diagnosis not present

## 2018-10-23 DIAGNOSIS — Z85828 Personal history of other malignant neoplasm of skin: Secondary | ICD-10-CM | POA: Diagnosis not present

## 2018-10-23 DIAGNOSIS — X32XXXD Exposure to sunlight, subsequent encounter: Secondary | ICD-10-CM | POA: Diagnosis not present

## 2018-10-23 DIAGNOSIS — C499 Malignant neoplasm of connective and soft tissue, unspecified: Secondary | ICD-10-CM | POA: Diagnosis not present

## 2018-10-23 DIAGNOSIS — L570 Actinic keratosis: Secondary | ICD-10-CM | POA: Diagnosis not present

## 2018-11-14 ENCOUNTER — Other Ambulatory Visit (HOSPITAL_COMMUNITY): Payer: Medicare HMO

## 2018-11-14 ENCOUNTER — Inpatient Hospital Stay (HOSPITAL_COMMUNITY): Admission: RE | Admit: 2018-11-14 | Payer: Medicare HMO | Source: Ambulatory Visit

## 2018-11-15 ENCOUNTER — Other Ambulatory Visit: Payer: Self-pay

## 2018-11-15 ENCOUNTER — Ambulatory Visit (HOSPITAL_COMMUNITY)
Admission: RE | Admit: 2018-11-15 | Discharge: 2018-11-15 | Disposition: A | Discharge status: Home / Self Care | Payer: Medicare HMO | Source: Ambulatory Visit | Attending: Family Medicine | Admitting: Family Medicine

## 2018-11-15 DIAGNOSIS — M85852 Other specified disorders of bone density and structure, left thigh: Secondary | ICD-10-CM | POA: Diagnosis not present

## 2018-11-15 DIAGNOSIS — M810 Age-related osteoporosis without current pathological fracture: Secondary | ICD-10-CM | POA: Diagnosis not present

## 2018-11-15 DIAGNOSIS — Z780 Asymptomatic menopausal state: Secondary | ICD-10-CM | POA: Diagnosis not present

## 2018-11-15 DIAGNOSIS — Z1231 Encounter for screening mammogram for malignant neoplasm of breast: Secondary | ICD-10-CM | POA: Diagnosis not present

## 2018-11-15 DIAGNOSIS — E2839 Other primary ovarian failure: Secondary | ICD-10-CM

## 2018-11-15 DIAGNOSIS — C44692 Other specified malignant neoplasm of skin of right upper limb, including shoulder: Secondary | ICD-10-CM | POA: Diagnosis not present

## 2018-11-29 ENCOUNTER — Ambulatory Visit (INDEPENDENT_AMBULATORY_CARE_PROVIDER_SITE_OTHER): Payer: Medicare HMO | Admitting: Plastic Surgery

## 2018-11-29 ENCOUNTER — Other Ambulatory Visit: Payer: Self-pay

## 2018-11-29 ENCOUNTER — Encounter: Payer: Self-pay | Admitting: Plastic Surgery

## 2018-11-29 VITALS — BP 147/78 | HR 96 | Temp 97.3°F | Ht 65.0 in | Wt 180.8 lb

## 2018-11-29 DIAGNOSIS — L989 Disorder of the skin and subcutaneous tissue, unspecified: Secondary | ICD-10-CM

## 2018-11-29 DIAGNOSIS — Z23 Encounter for immunization: Secondary | ICD-10-CM | POA: Diagnosis not present

## 2018-11-29 NOTE — Progress Notes (Signed)
Referring Provider Sharilyn Sites, MD [address] [place],  [place] [address]   CC:  Chief Complaint  Patient presents with  . Advice Only    for MOHS repair on (R) middle finger on 12/14/18      Bailey Wilson is an 78 y.o. female.  HPI: Patient is here for preoperative consult for reconstruction on the right dorsal ring finger after Mohs excision.  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  She said it started as a blister and then was ultimately biopsied by her dermatologist.  Mohs excision is planned for later this month.  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  She is interested in the potential reconstructive options and what the care will be like afterwards.  Allergies  Allergen Reactions  . Penicillins Hives and Itching    Has patient had a PCN reaction causing immediate rash, facial/tongue/throat swelling, SOB or lightheadedness with hypotension: YES Has patient had a PCN reaction causing severe rash involving mucus membranes or skin necrosis: No Has patient had a PCN reaction that required hospitalization No Has patient had a PCN reaction occurring within the last 10 years: No If all of the above answers are "NO", then may proceed with Cephalosporin use.   . Ace Inhibitors Cough  . Aspirin Other (See Comments)    Heart racing (large doses)  . Sulfa Antibiotics Itching and Rash    Outpatient Encounter Medications as of 11/29/2018  Medication Sig Note  . albuterol (PROVENTIL HFA;VENTOLIN HFA) 108 (90 Base) MCG/ACT inhaler Inhale 2 puffs into the lungs every 6 (six) hours as needed for wheezing or shortness of breath. 12/19/2017: Patient states that she needs an HFA  . amLODipine (NORVASC) 5 MG tablet Take 5 mg by mouth every morning.    [name] ARTIFICIAL TEAR OP Apply 2 drops to eye every morning.    [name] aspirin EC 81 MG tablet Take  81 mg by mouth at bedtime.   . AZO-CRANBERRY PO Take 2 tablets by mouth every morning.    . Calcium 600-200 MG-UNIT tablet Take 1 tablet by mouth daily.   . calcium carbonate (TUMS - DOSED IN MG ELEMENTAL CALCIUM) 500 MG chewable tablet Chew 1 tablet by mouth daily as needed for indigestion or heartburn.   . cetirizine (ZYRTEC) 10 MG tablet Take 10 mg by mouth every morning.    . docusate sodium (COLACE) 100 MG capsule Take 100 mg by mouth daily as needed for mild constipation.    [name] guaiFENesin-codeine 100-10 MG/5ML syrup Take 5 mLs by mouth every 6 (six) hours as needed for cough.    . hydrochlorothiazide (HYDRODIURIL) 25 MG tablet Take 25 mg by mouth every morning.    . Multiple Vitamins-Minerals (ICAPS MV PO) Take 1 capsule by mouth every morning.    . sodium chloride (OCEAN) 0.65 % SOLN nasal spray Place 1 spray into both nostrils as needed for congestion.    No facility-administered encounter medications on file as of 11/29/2018.      Past Medical History:  Diagnosis Date  . Anemia   . Arthritis    "qwhere/Dr. Noemi Chapel" (07/04/2016)  . Asthma    "been told I've had it; been told I don't" (07/04/2016)  . Carotid stenosis 06/03/2016   1-39% bilateral ICA stenosis 05/2016.  . Environmental and seasonal allergies   . Family history  of adverse reaction to anesthesia    son may have had sleep apnea had problem waking him up  . GERD (gastroesophageal reflux disease)   . Hypertension   . Murmur 05/06/2016  . Pneumonia 1980s X1  . Primary localized osteoarthritis of right knee     Past Surgical History:  Procedure Laterality Date  . ABDOMINAL HYSTERECTOMY  1985  . CATARACT EXTRACTION W/ INTRAOCULAR LENS  IMPLANT, BILATERAL Bilateral 2002-2007   "left-right"  . COLONOSCOPY    . [place]   "prior to hysterectomy"  . FRACTURE SURGERY    . JOINT REPLACEMENT    . NASAL FRACTURE SURGERY  2016  . REPLACEMENT TOTAL KNEE Right   . TONSILLECTOMY    . TOTAL HIP  ARTHROPLASTY Right 07/04/2016  . TOTAL HIP ARTHROPLASTY Right 07/04/2016   Procedure: TOTAL HIP ARTHROPLASTY ANTERIOR APPROACH;  Surgeon: Frederik Pear, MD;  Location: [facility];  Service: Orthopedics;  Laterality: Right;  . TOTAL KNEE ARTHROPLASTY Right 05/10/2017   Procedure: RIGHT TOTAL KNEE ARTHROPLASTY;  Surgeon: Frederik Pear, MD;  Location: [facility];  Service: Orthopedics;  Laterality: Right;    Family History  Problem Relation Age of Onset  . Asthma Mother   . Arrhythmia Mother   . Heart disease Mother   . Heart disease Father   . Clotting disorder Father   . Hypertension Paternal Grandfather   . Blindness Paternal Grandmother     Social History   Social History Narrative  . Not on file     Review of Systems General: Denies fevers, chills, weight loss CV: Denies chest pain, shortness of breath, palpitations  Physical Exam Vitals with BMI 11/29/2018 12/19/2017 05/12/2017  Height 5\' 5"  5\' 6"  -  Weight 180 lbs 13 oz 172 lbs -  BMI 99991111 [id number] -  Systolic [id number]  Diastolic 78 [id number]  Pulse 96 88 102    General:  No acute distress,  Alert and oriented, Non-Toxic, Normal speech and affect Focused exam of the right hand: Fingers well-perfused normal cap refill palp radial pulse.  Sensation is intact throughout.  Range of motion on flexion is limited likely by arthritis.  Dorsal aspect of the ring finger distal to the mid MP joint approaching the third webspace is a centimeter to a centimeter and a half diameter scabbed over lesion with mild surrounding erythema.  This corresponds to the area of concern.  Specifically on the ring finger her PIP flexion is limited to about 60 degrees.  She appears to be able to fully extend.  Assessment/Plan Patient is here for preoperative consultation for planned Mohs excision dorsal aspect of the right hand.  I explained the choice of reconstruction will depend on the size of the defect and can range from primary closure to skin graft to the  potential need for flap closure.  I explained anticipate that any of these options could be performed in the clinic.  I explained that afterwards she would likely require a period of either immobilization or at least bulky bandaging to limit her range of motion as the wound is healing.  We discussed the risk of the various options that include bleeding, infection, damage surrounding structures, partial total graft or flap loss, need for additional procedures.  She seems fully understanding is anxious to get this process underway.  Cindra Presume 11/29/2018, 9:52 AM

## 2018-12-04 DIAGNOSIS — Z96641 Presence of right artificial hip joint: Secondary | ICD-10-CM | POA: Diagnosis not present

## 2018-12-04 DIAGNOSIS — Z96651 Presence of right artificial knee joint: Secondary | ICD-10-CM | POA: Diagnosis not present

## 2018-12-05 ENCOUNTER — Institutional Professional Consult (permissible substitution): Payer: Medicare HMO | Admitting: Plastic Surgery

## 2018-12-11 DIAGNOSIS — L989 Disorder of the skin and subcutaneous tissue, unspecified: Secondary | ICD-10-CM | POA: Diagnosis not present

## 2018-12-11 DIAGNOSIS — C44692 Other specified malignant neoplasm of skin of right upper limb, including shoulder: Secondary | ICD-10-CM | POA: Diagnosis not present

## 2018-12-14 ENCOUNTER — Encounter: Payer: Self-pay | Admitting: Plastic Surgery

## 2018-12-14 ENCOUNTER — Other Ambulatory Visit: Payer: Self-pay

## 2018-12-14 ENCOUNTER — Ambulatory Visit (INDEPENDENT_AMBULATORY_CARE_PROVIDER_SITE_OTHER): Payer: Medicare HMO | Admitting: Plastic Surgery

## 2018-12-14 VITALS — BP 141/84 | HR 110 | Temp 96.8°F | Wt 182.8 lb

## 2018-12-14 DIAGNOSIS — L989 Disorder of the skin and subcutaneous tissue, unspecified: Secondary | ICD-10-CM | POA: Diagnosis not present

## 2018-12-14 NOTE — Progress Notes (Signed)
Operative Note   DATE OF OPERATION: 12/14/2018  SURGICAL DEPARTMENT: Plastic Surgery  PREOPERATIVE DIAGNOSES: Right dorsal ring finger and third webspace defect measuring 3 x 2 and half centimeters.  POSTOPERATIVE DIAGNOSES:  same  PROCEDURE:  1. Preparation for skin graft right dorsal ring finger measuring 3 x 2.5 cm 2.  Full-thickness skin graft from right AC fossa to defect measuring 3 x 2.5 cm  SURGEON: Talmadge Coventry, MD  ASSISTANT: None  ANESTHESIA:  General.   COMPLICATIONS: None.   INDICATIONS FOR PROCEDURE:  The patient, Bailey Wilson is a 78 y.o. female born on [birth year], is here for treatment of full-thickness wound on the dorsal aspect of her ring finger extending into the third webspace.  This is from Mohs excision and she was sent here for reconstruction.  There is a small amount of soft tissue soft tissue over the dorsal extensor tendon. MRN: [id number]  CONSENT:  Informed consent was obtained directly from the patient. Risks, benefits and alternatives were fully discussed. Specific risks including but not limited to bleeding, infection, hematoma, seroma, scarring, pain, contracture, asymmetry, wound healing problems, and need for further surgery were all discussed. The patient did have an ample opportunity to have questions answered to satisfaction.   DESCRIPTION OF PROCEDURE:  Local anesthesia was administered.  The patient's operative site was prepped and draped in a sterile fashion. A time out was performed and all information was confirmed to be correct.  I started by debriding the dorsal ring finger and third webspace wound.  This was done with a 15 blade.  It revealed a healthy wound bed with no totally exposed tendon.  I then harvested the skin graft from the [facility] fossa with a 15 blade and the defect was closed with interrupted buried 4-0 Monocryl and a running 4-0 Monocryl.  The graft was then defatted and inset with 4-0 chromic.  Nylon and Xeroform were then  used to fashion a bolster.  A bulky soft dressing was then applied.  She tolerated the procedure well.

## 2018-12-26 ENCOUNTER — Other Ambulatory Visit: Payer: Self-pay

## 2018-12-26 ENCOUNTER — Ambulatory Visit (INDEPENDENT_AMBULATORY_CARE_PROVIDER_SITE_OTHER): Payer: Medicare HMO | Admitting: Plastic Surgery

## 2018-12-26 ENCOUNTER — Encounter: Payer: Self-pay | Admitting: Plastic Surgery

## 2018-12-26 VITALS — BP 139/84 | HR 103 | Temp 97.1°F | Ht 65.0 in | Wt 182.0 lb

## 2018-12-26 DIAGNOSIS — L989 Disorder of the skin and subcutaneous tissue, unspecified: Secondary | ICD-10-CM

## 2018-12-26 NOTE — Progress Notes (Signed)
Doing great after full-thickness skin graft.  Bolster was removed revealing complete take.  Donor site is healing fine.  We will try to protect this for another week or so.  Plan to see her back in 2 weeks.  She knows she can get into the shower at this point.  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

## 2018-12-27 DIAGNOSIS — C801 Malignant (primary) neoplasm, unspecified: Secondary | ICD-10-CM | POA: Diagnosis not present

## 2019-01-09 ENCOUNTER — Ambulatory Visit (INDEPENDENT_AMBULATORY_CARE_PROVIDER_SITE_OTHER): Payer: Medicare HMO | Admitting: Plastic Surgery

## 2019-01-09 ENCOUNTER — Other Ambulatory Visit: Payer: Self-pay

## 2019-01-09 VITALS — BP 125/78 | HR 91 | Temp 97.5°F | Ht 66.0 in | Wt 180.0 lb

## 2019-01-09 DIAGNOSIS — L989 Disorder of the skin and subcutaneous tissue, unspecified: Secondary | ICD-10-CM

## 2019-01-09 NOTE — Progress Notes (Signed)
Patient is doing well postop from her full-thickness skin graft to the dorsal aspect of her ring finger.  She feels good about her healing process and has no complaints.  On exam the graft is completely healed and is a full tape.  The donor site is also completely healed.  I did discuss her case with the skin cancer tumor board and everyone agreed that there is nothing further required for her in terms of work-up or additional treatment.  I explained this to her and she was very appreciative.  I will plan to see her again as needed.

## 2019-01-17 DIAGNOSIS — J069 Acute upper respiratory infection, unspecified: Secondary | ICD-10-CM | POA: Diagnosis not present

## 2019-01-17 DIAGNOSIS — Z20828 Contact with and (suspected) exposure to other viral communicable diseases: Secondary | ICD-10-CM | POA: Diagnosis not present

## 2019-01-17 DIAGNOSIS — Z681 Body mass index (BMI) 19 or less, adult: Secondary | ICD-10-CM | POA: Diagnosis not present

## 2019-02-24 IMAGING — CR DG CHEST 2V
2 series · 2 of 2 positions shown · non-contrast
Comparison: Chest x-ray of April 22, 2016

CLINICAL DATA: Preoperative examination prior to total knee joint
replacement. History of asthma, cardiac murmur, hypertension,
previous episodes of pneumonia. Nonsmoker.

EXAM:
CHEST - 2 VIEW

[w chest pa]
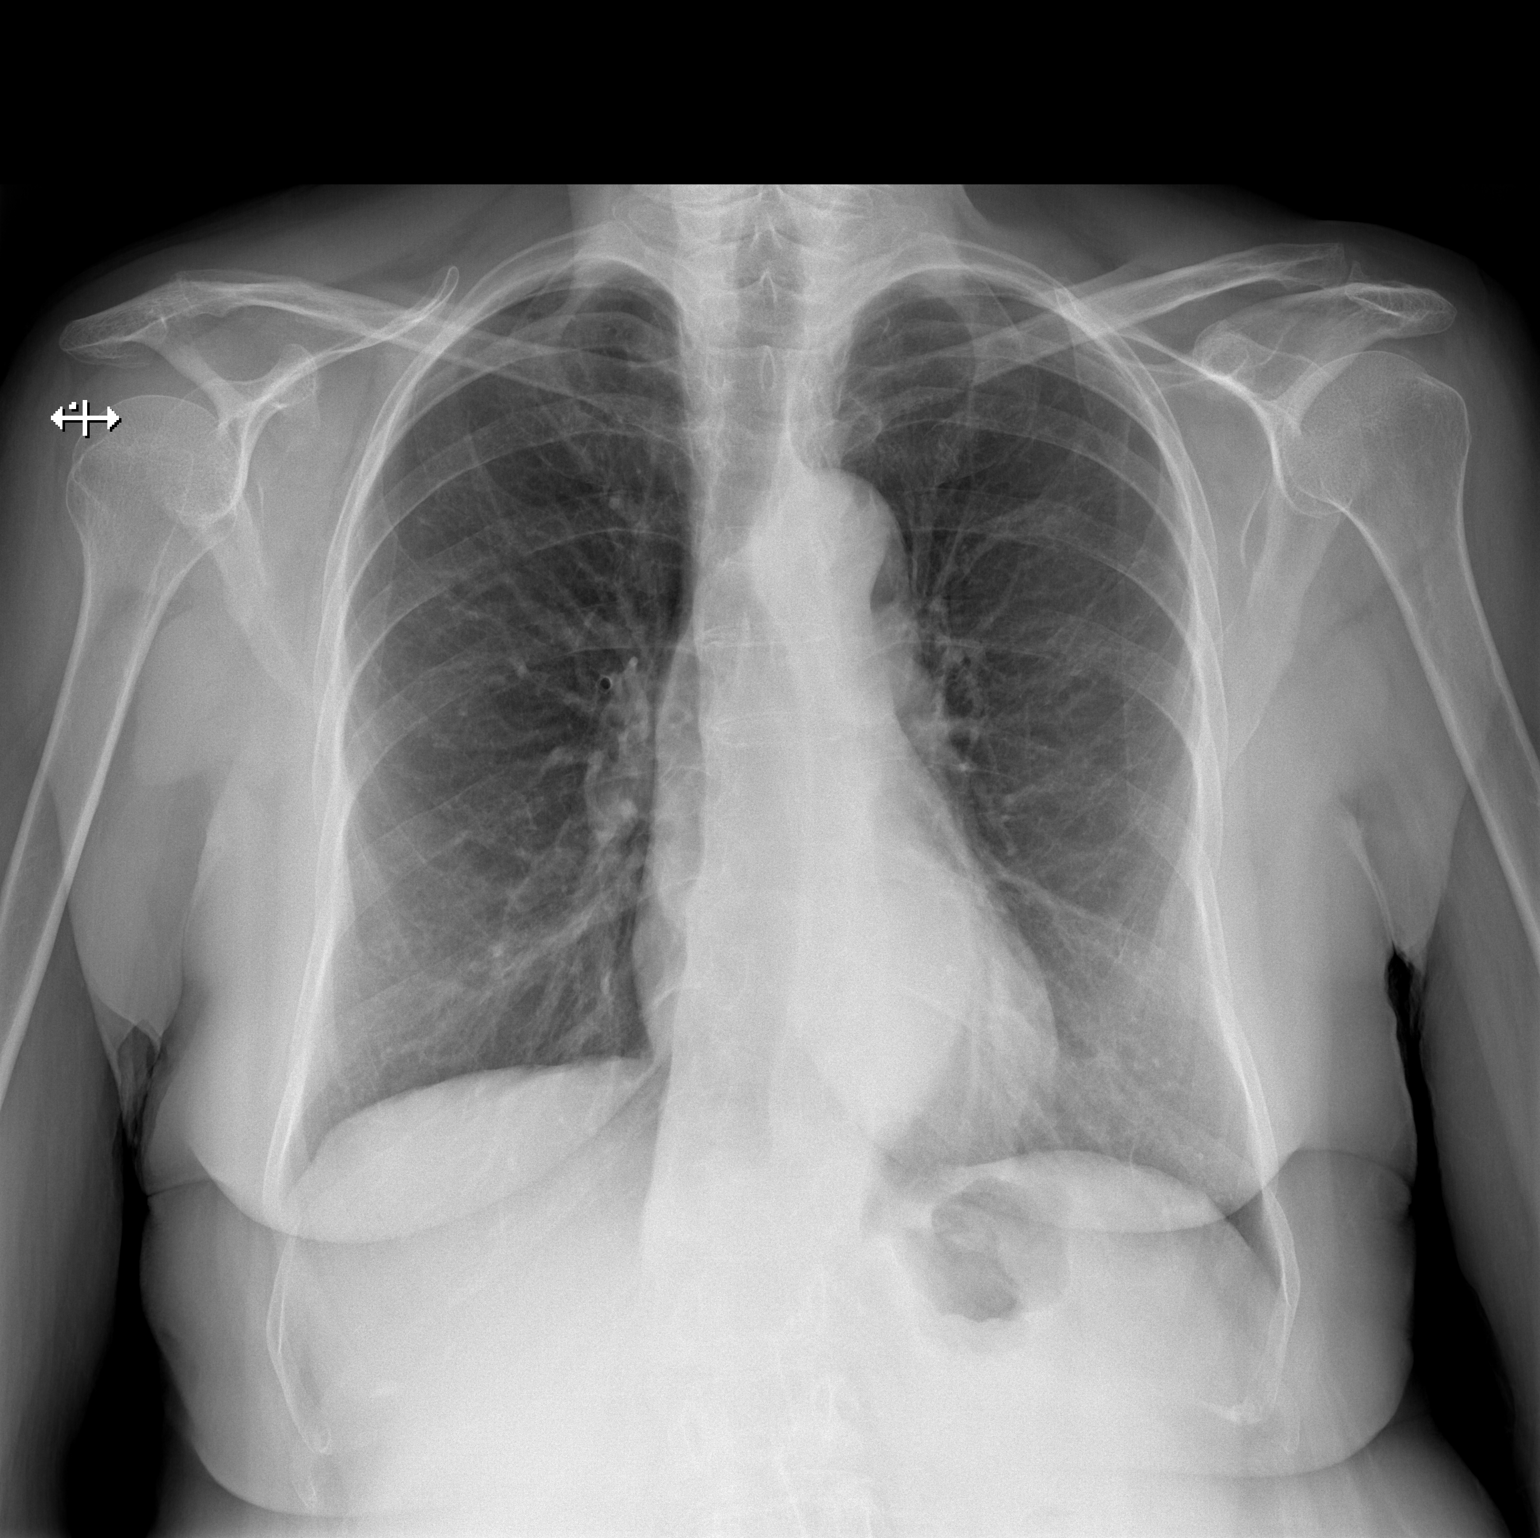

[w chest lat]
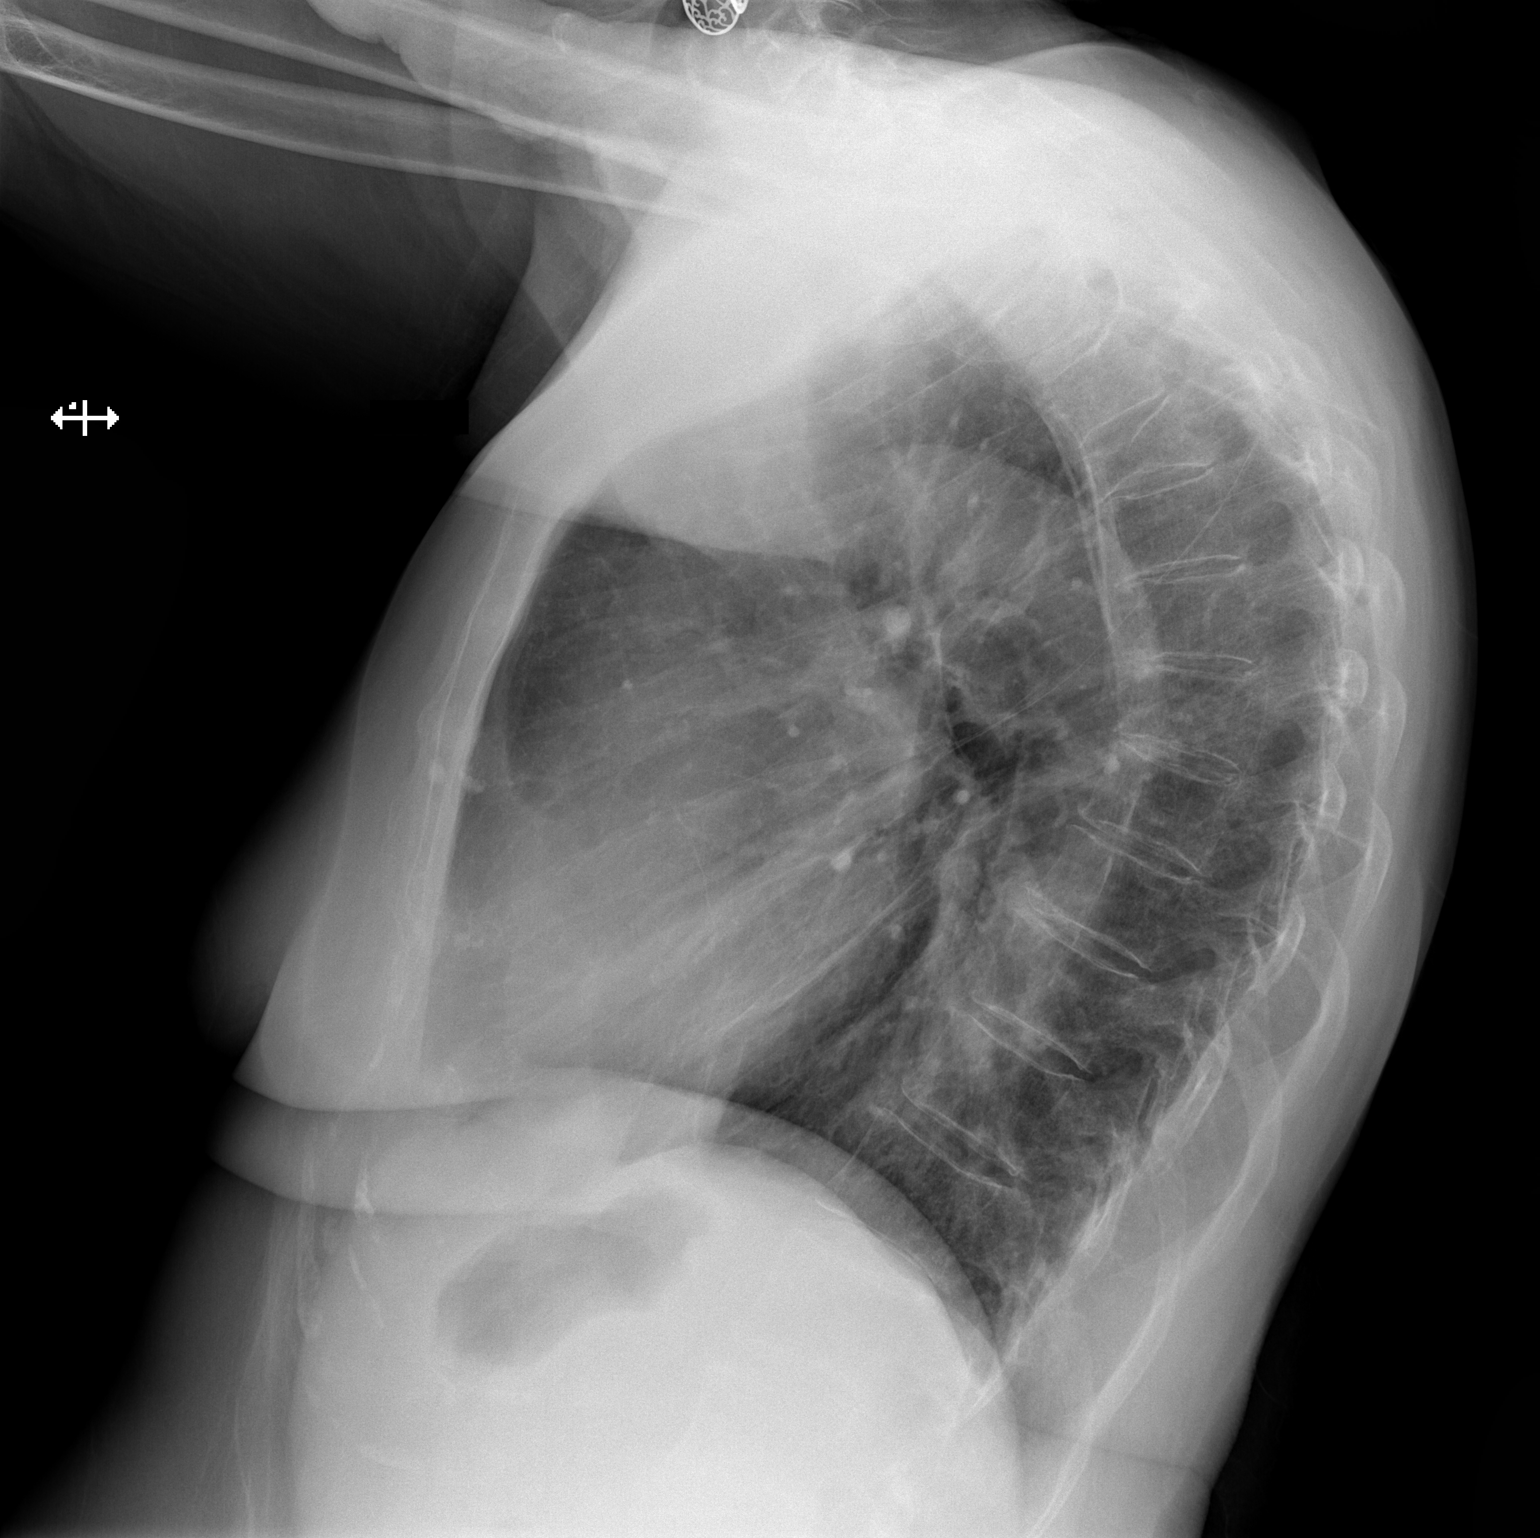

[2 of 2 positions shown; findings below may reference images not displayed]

FINDINGS: The lungs are adequately inflated and clear. The heart and pulmonary
vascularity are normal. There is calcification in the wall of the
tortuous descending thoracic aorta. There is chronic compression of
the body of approximately T4.
IMPRESSION: There is no active cardiopulmonary disease.

Thoracic aortic atherosclerosis. Chronic 50% anterior compression
fracture of T4.

## 2019-02-28 ENCOUNTER — Encounter: Payer: Self-pay | Admitting: Cardiovascular Disease

## 2019-02-28 ENCOUNTER — Other Ambulatory Visit: Payer: Self-pay

## 2019-02-28 ENCOUNTER — Ambulatory Visit: Payer: Medicare HMO | Admitting: Cardiovascular Disease

## 2019-02-28 VITALS — BP 150/92 | HR 105 | Ht 66.0 in | Wt 179.8 lb

## 2019-02-28 DIAGNOSIS — Z79899 Other long term (current) drug therapy: Secondary | ICD-10-CM

## 2019-02-28 DIAGNOSIS — I6523 Occlusion and stenosis of bilateral carotid arteries: Secondary | ICD-10-CM | POA: Diagnosis not present

## 2019-02-28 DIAGNOSIS — I10 Essential (primary) hypertension: Secondary | ICD-10-CM | POA: Diagnosis not present

## 2019-02-28 DIAGNOSIS — I4891 Unspecified atrial fibrillation: Secondary | ICD-10-CM

## 2019-02-28 MED ORDER — METOPROLOL TARTRATE 25 MG PO TABS: 25.0000 mg | ORAL_TABLET | Freq: Two times a day (BID) | ORAL | 3 refills | Status: DC

## 2019-02-28 MED ORDER — APIXABAN 5 MG PO TABS: 5.0000 mg | ORAL_TABLET | Freq: Two times a day (BID) | ORAL | 11 refills | Status: DC

## 2019-02-28 NOTE — Addendum Note (Signed)
Addended by: Alvina Filbert B on: 02/28/2019 09:50 AM   Modules accepted: Orders

## 2019-02-28 NOTE — Progress Notes (Signed)
Cardiology Office Note   Date:  02/28/2019   ID:  Bailey Wilson, DOB 10/08/1940, MRN [id number]  PCP:  Sharilyn Sites, MD  Cardiologist:   Skeet Latch, MD  Orthopedic Surgeon: Elsie Saas, MD  No chief complaint on file.     History of Present Illness: Bailey Wilson is a 79 y.o. female with paroxysmal atrial fibrillation, hypertension, mild bilateral carotid stenosis, and mild ascending aortic aneurysm who presents for follow-up. Bailey Wilson was first seen 04/2016 for presurgical risk assessment.  Bailey Wilson was seen 04/20/16 by Vonda Antigua, PA-C, prior to knee surgery. She was noted to have a murmur on exam. EKG at that time revealed sinus rhythm with frequent PACs. She was referred for an echo 05/25/16 that revealed LVEF 55-60% with grade 1 diastolic dysfunction. She also had a mild ascending aortic aneurysm of 3.9 cm. She had no significant valvular abnormalities. Carotid Dopplers revealed 1-39% ICA stenosis bilaterally. She was referred to cardiology for presurgical risk assessment. Given that she is not very physically active she was referred for [facility] 05/2016 that showed LVEF 75% and no ischemia. At her last appointment her blood pressure was poorly controlled. Amlodipine was increased to 10 mg daily.  Since her last appointment Bailey Wilson has been doing well.  In November she had an episode of bronchitis that has subsequently resolved.  When she saw Dr. Hilma Favors her blood pressure was low so amlodipine was reduced to 2.5 mg.  Lately she has been feeling okay.  She denies any palpitations, chest pain, shortness of breath, lower extremity edema, orthopnea, or PND.  She is not getting much exercise.   Past Medical History:  Diagnosis Date  . Anemia   . Arthritis    "qwhere/Dr. Noemi Chapel" (07/04/2016)  . Asthma    "been told I've had it; been told I don't" (07/04/2016)  . Carotid stenosis 06/03/2016   1-39% bilateral ICA stenosis 05/2016.  . Environmental and seasonal  allergies   . Family history of adverse reaction to anesthesia    son may have had sleep apnea had problem waking him up  . GERD (gastroesophageal reflux disease)   . Hypertension   . Murmur 05/06/2016  . Pneumonia 1980s X1  . Primary localized osteoarthritis of right knee     Past Surgical History:  Procedure Laterality Date  . ABDOMINAL HYSTERECTOMY  1985  . CATARACT EXTRACTION W/ INTRAOCULAR LENS  IMPLANT, BILATERAL Bilateral 2002-2007   "left-right"  . COLONOSCOPY    . [place]   "prior to hysterectomy"  . FRACTURE SURGERY    . JOINT REPLACEMENT    . NASAL FRACTURE SURGERY  2016  . REPLACEMENT TOTAL KNEE Right   . TONSILLECTOMY    . 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 TOTAL HIP ARTHROPLASTY Right 07/04/2016   Procedure: TOTAL HIP ARTHROPLASTY ANTERIOR APPROACH;  Surgeon: Frederik Pear, MD;  Location: [place];  Service: Orthopedics;  Laterality: Right;  . TOTAL KNEE ARTHROPLASTY Right 05/10/2017   Procedure: RIGHT TOTAL KNEE ARTHROPLASTY;  Surgeon: Frederik Pear, MD;  Location: [facility];  Service: Orthopedics;  Laterality: Right;     Current Outpatient Medications  Medication Sig Dispense Refill  . albuterol (PROVENTIL HFA;VENTOLIN HFA) 108 (90 Base) MCG/ACT inhaler Inhale 2 puffs into the lungs every 6 (six) hours as needed for wheezing or shortness of breath.    [name] amLODipine (NORVASC) 5 MG tablet Take 2.5 mg by mouth every morning.     [name]  ARTIFICIAL TEAR OP Apply 2 drops to eye every morning.     [name] aspirin EC 81 MG tablet Take 81 mg by mouth at bedtime.    . AZO-CRANBERRY PO Take 2 tablets by mouth every morning.     . Calcium 600-200 MG-UNIT tablet Take 1 tablet by mouth daily.    . calcium carbonate (TUMS - DOSED IN MG ELEMENTAL CALCIUM) 500 MG chewable tablet Chew 1 tablet by mouth daily as needed for indigestion or heartburn.    . cetirizine (ZYRTEC) 10 MG tablet Take 10 mg by mouth every morning.     . docusate sodium (COLACE) 100 MG  capsule Take 100 mg by mouth daily as needed for mild constipation.     . hydrochlorothiazide (HYDRODIURIL) 25 MG tablet Take 25 mg by mouth every morning.     . Multiple Vitamins-Minerals (ICAPS MV PO) Take 1 capsule by mouth every morning.     . sodium chloride (OCEAN) 0.65 % SOLN nasal spray Place 1 spray into both nostrils as needed for congestion.    [name] apixaban (ELIQUIS) 5 MG TABS tablet Take 1 tablet (5 mg total) by mouth 2 (two) times daily. 60 tablet 11  . metoprolol tartrate (LOPRESSOR) 25 MG tablet Take 1 tablet (25 mg total) by mouth 2 (two) times daily. 180 tablet 3   No current facility-administered medications for this visit.    Allergies:   Penicillins, Ace inhibitors, Aspirin, and Sulfa antibiotics    Social History:  The patient  reports that she has never smoked. She has never used smokeless tobacco. She reports that she does not drink alcohol or use drugs.   Family History:  The patient's family history includes Arrhythmia in her mother; Asthma in her mother; Blindness in her paternal grandmother; Clotting disorder in her father; Heart disease in her father and mother; Hypertension in her paternal grandfather.    ROS:  Please see the history of present illness.   Otherwise, review of systems are positive for none.   All other systems are reviewed and negative.    PHYSICAL EXAM: VS:  BP (!) 150/92   Pulse (!) 105   Ht 5\' 6"  (1.676 m)   Wt 179 lb 12.8 oz (81.6 kg)   BMI 29.02 kg/m  , BMI Body mass index is 29.02 kg/m. GENERAL:  Well appearing HEENT: Pupils equal round and reactive, fundi not visualized, oral mucosa unremarkable NECK:  No jugular venous distention, waveform within normal limits, carotid upstroke brisk and symmetric, no bruits, no thyromegaly LYMPHATICS:  No cervical adenopathy LUNGS:  Clear to auscultation bilaterally HEART:  Irregularly irregular.  PMI not displaced or sustained,S1 and S2 within normal limits, no S3, no S4, no clicks, no rubs, no  murmurs ABD:  Flat, positive bowel sounds normal in frequency in pitch, no bruits, no rebound, no guarding, no midline pulsatile mass, no hepatomegaly, no splenomegaly EXT:  2 plus pulses throughout, no edema, no cyanosis no clubbing SKIN:  No rashes no nodules NEURO:  Cranial nerves II through XII grossly intact, motor grossly intact throughout PSYCH:  Cognitively intact, oriented to person place and time   EKG:  EKG is ordered today. The ekg ordered today demonstrates sinus rhythm.  Rate 95 bpm. 04/22/16: Sinus rhythm. Rate 98 bpm. Frequent PACs. LVH with repolarization abnormalities. 02/28/2019: Atrial fibrillation.  Rate 105 bpm.  PVC.  Cannot rule out prior anteroseptal infarct.  Echo 05/25/16: Study Conclusions  - Left ventricle: The cavity size was normal. There was mild  concentric hypertrophy. Systolic function was normal. The   estimated ejection fraction was in the range of 55% to 60%. Wall   motion was normal; there were no regional wall motion   abnormalities. Doppler parameters are consistent with abnormal   left ventricular relaxation (grade 1 diastolic dysfunction). - Aorta: Ascending aortic diameter: 39 mm (S). - Ascending aorta: The ascending aorta was mildly dilated. - Mitral valve: There was trivial regurgitation.  Carotid Doppler 05/26/16: 1-39% ICA stenosis bilaterally.  Lexiscan Myoview 05/25/16: The left ventricular ejection fraction is hyperdynamic (>65%).  Nuclear stress EF: 75%.  There was no ST segment deviation noted during stress.  No T wave inversion was noted during stress.  The study is normal. This is a low risk study   Recent Labs: No results found for requested labs within last 8760 hours.   04/22/16: WBC 9.7, hemoglobin 11.0, hematocrit 32.9, platelets 291  Lipid Panel No results found for: CHOL, TRIG, HDL, CHOLHDL, VLDL, LDLCALC, LDLDIRECT    Wt Readings from Last 3 Encounters:  02/28/19 179 lb 12.8 oz (81.6 kg)  01/09/19 180 lb  (81.6 kg)  12/26/18 182 lb (82.6 kg)      ASSESSMENT AND PLAN:  # Paroxysmal atrial fibrillation:  Bailey Wilson was noted to be in atrial fibrillation today.  She is very minimally symptomatic.  We will start metoprolol tartrate 25 mg twice daily.  She will also start Eliquis 5 mg twice daily.  She does report a history of anemia.  She denies any recent melena or hematochezia.  We will check a CBC today prior to Eliquis initiation.  Check an echocardiogram.  [facility] would like to [facility] at [facility].  Given that she lives in [place] it is hard for her to come to [place].  We will arrange for her to follow-up in our [place] office.  # Carotid stenosis: Mild ICA stenosis bilaterally.  Continue aspirin.  She already ate today.  She will need lipids checked at follow-up.  # Hypertension:  Blood pressure is above goal today.  Amlodipine was recently reduced in the setting of illness.  We will continue 2.5 mg daily for now.  Add metoprolol as above.  It is quite likely the amlodipine will need to be increased again. Continue HCTZ.     Current medicines are reviewed at length with the patient today.  The patient does not have concerns regarding medicines.  The following changes have been made: start metoprolol and Eliquis.  Stop aspirin.   Labs/ tests ordered today include:   Orders Placed This Encounter  Procedures  . CBC  . Magnesium  . TSH  . Comprehensive Metabolic Panel (CMET)  . EKG 12-Lead  . ECHOCARDIOGRAM COMPLETE     Disposition:   [facility] in [place] in 2 weeks   This note was written with the assistance of speech recognition software.  Please excuse any transcriptional errors.  Signed, Sanad Fearnow C. Oval Linsey, MD, [facility]  02/28/2019 9:43 AM    [facility]

## 2019-02-28 NOTE — Patient Instructions (Addendum)
Medication Instructions:  STOP ASPIRIN   START ELIQUIS 5 MG TWICE A DAY   START METOPROLOL 25 MG TWICE A DAY   *If you need a refill on your cardiac medications before your next appointment, please call your pharmacy*  Lab Work: CMET/CBC/MAGNESIUM/TSH TODAY   If you have labs (blood work) drawn today and your tests are completely normal, you will receive your results only by: [name] MyChart Message (if you have MyChart) OR . A paper copy in the mail If you have any lab test that is abnormal or we need to change your treatment, we will call you to review the results.  Testing/Procedures: Your physician has requested that you have an echocardiogram. Echocardiography is a painless test that uses sound waves to create images of your heart. It provides your doctor with information about the size and shape of your heart and how well your heart's chambers and valves are working. This procedure takes approximately one hour. There are no restrictions for this procedure. AT [place]: At [facility], you and your health needs are our priority.  As part of our continuing mission to provide you with exceptional heart care, we have created designated Provider Care Teams.  These Care Teams include your primary Cardiologist (physician) and Advanced Practice Providers (APPs -  Physician Assistants and Nurse Practitioners) who all work together to provide you with the care you need, when you need it.  Your next appointment:   2 week(s) IN [place]   The format for your next appointment:   In Person  Provider:   You may see one of the following Advanced Practice Providers on your designated Care Team:    [place], PA-C   Ermalinda Barrios, [place]

## 2019-03-01 LAB — CBC
Hematocrit: 38.7 % (ref 34.0–46.6)
Hemoglobin: 12.4 g/dL (ref 11.1–15.9)
MCH: 26.8 pg (ref 26.6–33.0)
MCHC: 32 g/dL (ref 31.5–35.7)
MCV: 84 fL (ref 79–97)
Platelets: 260 10*3/uL (ref 150–450)
RBC: 4.62 x10E6/uL (ref 3.77–5.28)
RDW: 14 % (ref 11.7–15.4)
WBC: 8.4 10*3/uL (ref 3.4–10.8)

## 2019-03-01 LAB — COMPREHENSIVE METABOLIC PANEL
ALT: 11 IU/L (ref 0–32)
AST: 17 IU/L (ref 0–40)
Albumin/Globulin Ratio: 1.3 (ref 1.2–2.2)
Albumin: 4.2 g/dL (ref 3.7–4.7)
Alkaline Phosphatase: 100 IU/L (ref 39–117)
BUN/Creatinine Ratio: 16 (ref 12–28)
BUN: 16 mg/dL (ref 8–27)
Bilirubin Total: 0.5 mg/dL (ref 0.0–1.2)
CO2: 27 mmol/L (ref 20–29)
Calcium: 10.7 mg/dL — ABNORMAL HIGH (ref 8.7–10.3)
Chloride: 95 mmol/L — ABNORMAL LOW (ref 96–106)
Creatinine, Ser: 1 mg/dL (ref 0.57–1.00)
GFR calc Af Amer: 62 mL/min/{1.73_m2} (ref >59)
GFR calc non Af Amer: 54 mL/min/{1.73_m2} — ABNORMAL LOW (ref >59)
Globulin, Total: 3.2 g/dL (ref 1.5–4.5)
Glucose: 86 mg/dL (ref 65–99)
Potassium: 4.1 mmol/L (ref 3.5–5.2)
Sodium: 137 mmol/L (ref 134–144)
Total Protein: 7.4 g/dL (ref 6.0–8.5)

## 2019-03-01 LAB — MAGNESIUM: Magnesium: 1.6 mg/dL (ref 1.6–2.3)

## 2019-03-01 LAB — TSH: TSH: 1.37 u[IU]/mL (ref 0.450–4.500)

## 2019-03-12 ENCOUNTER — Other Ambulatory Visit: Payer: Self-pay

## 2019-03-12 ENCOUNTER — Ambulatory Visit (HOSPITAL_COMMUNITY): Payer: Medicare HMO | Attending: Cardiovascular Disease

## 2019-03-12 DIAGNOSIS — I4891 Unspecified atrial fibrillation: Secondary | ICD-10-CM | POA: Insufficient documentation

## 2019-03-13 NOTE — Progress Notes (Signed)
Cardiology Office Note    Date:  03/18/2019   ID:  Bailey Wilson, DOB 1940/03/22, MRN [id number]  PCP:  Sharilyn Sites, MD  Cardiologist: No primary care provider on file. EPS: None  Chief Complaint  Patient presents with  . Follow-up    History of Present Illness:  Bailey Wilson is a 79 y.o. female with paroxysmal atrial fibrillation, hypertension, mild bilateral carotid stenosis, and mild ascending aortic aneurysm 3.9cm,echo 05/25/16 that revealed LVEF 55-60% with grade 1 diastolic dysfunction. She had no significant valvular abnormalities. Carotid Dopplers revealed 1-39% ICA stenosis bilaterally. Lexiscan Myoview 05/2016 that showed LVEF 75% and no ischemia.  Patient saw [name] 02/28/2019 at which time she was in atrial fibrillation and was started on Eliquis.  2D echo ordered.  Metoprolol was added for rate control.  Blood pressure was up that day and her amlodipine was recently reduced in the setting of illness but thoughts where she may need increased.Echo 03/12/19 normal LVEF, mild LVH, elevated PASP,   Patient comes in for follow-up.  Blood pressure still running high.  She gets short of breath with activity.  She tries to avoid salt but she does get a biscuit regularly at [employer] and is eating some canned food.       Past Medical History:  Diagnosis Date  . Anemia   . Arthritis    "qwhere/Dr. Noemi Chapel" (07/04/2016)  . Asthma    "been told I've had it; been told I don't" (07/04/2016)  . Carotid stenosis 06/03/2016   1-39% bilateral ICA stenosis 05/2016.  . Environmental and seasonal allergies   . Family history of adverse reaction to anesthesia    son may have had sleep apnea had problem waking him up  . GERD (gastroesophageal reflux disease)   . Hypertension   . Murmur 05/06/2016  . Pneumonia 1980s X1  . Primary localized osteoarthritis of right knee     Past Surgical History:  Procedure Laterality Date  . ABDOMINAL HYSTERECTOMY  1985  . CATARACT EXTRACTION W/  INTRAOCULAR LENS  IMPLANT, BILATERAL Bilateral 2002-2007   "left-right"  . COLONOSCOPY    . [place]   "prior to hysterectomy"  . FRACTURE SURGERY    . JOINT REPLACEMENT    . NASAL FRACTURE SURGERY  2016  . REPLACEMENT TOTAL KNEE Right   . TONSILLECTOMY    . 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 TOTAL HIP ARTHROPLASTY Right 07/04/2016   Procedure: TOTAL HIP ARTHROPLASTY ANTERIOR APPROACH;  Surgeon: Frederik Pear, MD;  Location: [facility];  Service: Orthopedics;  Laterality: Right;  . TOTAL KNEE ARTHROPLASTY Right 05/10/2017   Procedure: RIGHT TOTAL KNEE ARTHROPLASTY;  Surgeon: Frederik Pear, MD;  Location: [place];  Service: Orthopedics;  Laterality: Right;    Current Medications: Current Meds  Medication Sig  . albuterol (PROVENTIL HFA;VENTOLIN HFA) 108 (90 Base) MCG/ACT inhaler Inhale 2 puffs into the lungs every 6 (six) hours as needed for wheezing or shortness of breath.  [name] amLODipine (NORVASC) 5 MG tablet Take 1 tablet (5 mg total) by mouth every morning.  [name] apixaban (ELIQUIS) 5 MG TABS tablet Take 1 tablet (5 mg total) by mouth 2 (two) times daily.  . ARTIFICIAL TEAR OP Apply 2 drops to eye every morning.   . AZO-CRANBERRY PO Take 2 tablets by mouth every morning.   . Calcium 600-200 MG-UNIT tablet Take 1 tablet by mouth daily.  . calcium carbonate (TUMS - DOSED IN MG ELEMENTAL CALCIUM) 500  MG chewable tablet Chew 1 tablet by mouth daily as needed for indigestion or heartburn.  . cetirizine (ZYRTEC) 10 MG tablet Take 10 mg by mouth every morning.   . docusate sodium (COLACE) 100 MG capsule Take 100 mg by mouth daily as needed for mild constipation.   . hydrochlorothiazide (HYDRODIURIL) 25 MG tablet Take 25 mg by mouth every morning.   . metoprolol tartrate (LOPRESSOR) 25 MG tablet Take 1 tablet (25 mg total) by mouth 2 (two) times daily.  . Multiple Vitamins-Minerals (ICAPS MV PO) Take 1 capsule by mouth every morning.   . sodium chloride (OCEAN) 0.65 %  SOLN nasal spray Place 1 spray into both nostrils as needed for congestion.  . [DISCONTINUED] amLODipine (NORVASC) 5 MG tablet Take 2.5 mg by mouth every morning.      Allergies:   Penicillins, Ace inhibitors, Aspirin, and Sulfa antibiotics   Social History   Socioeconomic History  . Marital status: Widowed    Spouse name: Not on file  . Number of children: Not on file  . Years of education: Not on file  . Highest education level: Not on file  Occupational History  . Not on file  Tobacco Use  . Smoking status: Never Smoker  . Smokeless tobacco: Never Used  Substance and Sexual Activity  . Alcohol use: No  . Drug use: No  . Sexual activity: Never  Other Topics Concern  . Not on file  Social History Narrative  . Not on file   Social Determinants of Health   Financial Resource Strain:   . Difficulty of Paying Living Expenses: Not on file  Food Insecurity:   . Worried About Charity fundraiser in the Last Year: Not on file  . Ran Out of Food in the Last Year: Not on file  Transportation Needs:   . Lack of Transportation (Medical): Not on file  . Lack of Transportation (Non-Medical): Not on file  Physical Activity:   . Days of Exercise per Week: Not on file  . Minutes of Exercise per Session: Not on file  Stress:   . Feeling of Stress : Not on file  Social Connections:   . Frequency of Communication with Friends and Family: Not on file  . Frequency of Social Gatherings with Friends and Family: Not on file  . Attends Religious Services: Not on file  . Active Member of Clubs or Organizations: Not on file  . Attends Archivist Meetings: Not on file  . Marital Status: Not on file     Family History:  The patient's family history includes Arrhythmia in her mother; Asthma in her mother; Blindness in her paternal grandmother; Clotting disorder in her father; Heart disease in her father and mother; Hypertension in her paternal grandfather.   ROS:   Please see the  history of present illness.    ROS All other systems reviewed and are negative.   PHYSICAL EXAM:   VS:  BP (!) 141/91   Pulse 85   Temp 99.2 F (37.3 C)   Ht 5\' 6"  (1.676 m)   Wt 183 lb (83 kg)   SpO2 100%   BMI 29.54 kg/m   Physical Exam  GEN: Well nourished, well developed, in no acute distress  Neck: Slight increase JVD, no carotid bruits, or masses Cardiac:RRR; 1/6 systolic murmur in the left sternal border Respiratory:  clear to auscultation bilaterally, normal work of breathing GI: soft, nontender, nondistended, + BS Ext: without cyanosis, clubbing, or edema,  Good distal pulses bilaterally Neuro:  Alert and Oriented x 3 Psych: euthymic mood, full affect  Wt Readings from Last 3 Encounters:  03/18/19 183 lb (83 kg)  02/28/19 179 lb 12.8 oz (81.6 kg)  01/09/19 180 lb (81.6 kg)      Studies/Labs Reviewed:   EKG:  EKG is not ordered today.    Recent Labs: 02/28/2019: ALT 11; BUN 16; Creatinine, Ser 1.00; Hemoglobin 12.4; Magnesium 1.6; Platelets 260; Potassium 4.1; Sodium 137; TSH 1.370   Lipid Panel No results found for: CHOL, TRIG, HDL, CHOLHDL, VLDL, LDLCALC, LDLDIRECT  Additional studies/ records that were reviewed today include:  2D echo 03/12/2019 IMPRESSIONS      1. Left ventricular ejection fraction, by visual estimation, is 55 to 60%. The left ventricle has normal function. There is mildly increased left ventricular hypertrophy.  2. Left ventricular diastolic parameters are indeterminate.  3. The left ventricle has no regional wall motion abnormalities.  4. Global right ventricle has normal systolic function.The right ventricular size is normal. No increase in right ventricular wall thickness.  5. Left atrial size was moderately dilated.  6. Right atrial size was mildly dilated.  7. Moderate calcification of the mitral valve leaflet(s).  8. Moderate mitral annular calcification.  9. Moderate thickening of the mitral valve leaflet(s). 10. The mitral valve  is normal in structure. Mild mitral valve regurgitation. No evidence of mitral stenosis. 11. The tricuspid valve is normal in structure. 12. The tricuspid valve is normal in structure. Tricuspid valve regurgitation is moderate. 13. The aortic valve is tricuspid. Aortic valve regurgitation is mild. Mild aortic valve sclerosis without stenosis. 14. Pulmonic regurgitation is moderate. 15. The pulmonic valve was normal in structure. Pulmonic valve regurgitation is moderate. 16. Aortic dilatation noted. 17. There is mild dilatation of the aortic root measuring 39 mm. 18. Moderately elevated pulmonary artery systolic pressure. 19. The inferior vena cava is normal in size with greater than 50% respiratory variability, suggesting right atrial pressure of 3 mmHg.      Echo 05/25/16: Study Conclusions   - Left ventricle: The cavity size was normal. There was mild   concentric hypertrophy. Systolic function was normal. The   estimated ejection fraction was in the range of 55% to 60%. Wall   motion was normal; there were no regional wall motion   abnormalities. Doppler parameters are consistent with abnormal   left ventricular relaxation (grade 1 diastolic dysfunction). - Aorta: Ascending aortic diameter: 39 mm (S). - Ascending aorta: The ascending aorta was mildly dilated. - Mitral valve: There was trivial regurgitation.   Carotid Doppler 05/26/16: 1-39% ICA stenosis bilaterally.   Lexiscan Myoview 05/25/16: The left ventricular ejection fraction is hyperdynamic (>65%).  Nuclear stress EF: 75%.  There was no ST segment deviation noted during stress.  No T wave inversion was noted during stress.  The study is normal. This is a low risk study   Carotid Dopplers 5/17/2019Final Interpretation:  Right Carotid: Velocities in the right ICA are consistent with a 1-39%  stenosis.   Left Carotid: Velocities in the left ICA are consistent with a 1-39%  stenosis.   Vertebrals: Bilateral  vertebral arteries demonstrate antegrade flow.  Subclavians: Normal flow hemodynamics were seen in bilateral subclavian               arteries.     ASSESSMENT:    1. Paroxysmal atrial fibrillation (HCC)   2. Bilateral carotid artery stenosis   3. Essential hypertension      PLAN:  In order of problems listed above:  PAF CHA2DS2-VASc equals5 started on Eliquis and metoprolol.  2D echo 03/12/2019 LVEF 55 to 60% with mild LVH, LA moderately dilated, mild dilatation of the aortic root 39 mm moderately elevated pulmonary artery systolic pressures.  We will have her take extra diuretic today and tomorrow and reevaluate at follow-up.  To establish care here.  Mild bilateral carotid stenosis 1 to 39% 06/2017  Essential hypertension on HCTZ amlodipine recently reduced and setting of acute illness but may need increased.  Blood pressure still running high.  Will increase amlodipine to 5 mg once daily.  2 g sodium diet.  Take 2 HCTZ today and tomorrow then back to 1.    Medication Adjustments/Labs and Tests Ordered: Current medicines are reviewed at length with the patient today.  Concerns regarding medicines are outlined above.  Medication changes, Labs and Tests ordered today are listed in the Patient Instructions below. Patient Instructions   Medication Instructions:  INCREASE Amlodipine to 5 mg daily   Take your HCTZ 2 tablets TODAY and TONORROW, then resume one daily.3 *If you need a refill on your cardiac medications before your next appointment, please call your pharmacy*  Lab Work: None If you have labs (blood work) drawn today and your tests are completely normal, you will receive your results only by: [name] MyChart Message (if you have MyChart) OR . A paper copy in the mail If you have any lab test that is abnormal or we need to change your treatment, we will call you to review the results.  Testing/Procedures: None  Follow-Up: At [facility], you and your health needs are  our priority.  As part of our continuing mission to provide you with exceptional heart care, we have created designated Provider Care Teams.  These Care Teams include your primary Cardiologist (physician) and Advanced Practice Providers (APPs -  Physician Assistants and Nurse Practitioners) who all work together to provide you with the care you need, when you need it.  Your next appointment:   1 month(s)  The format for your next appointment:   In Person  Provider:   Carlyle Dolly, MD  Other Instructions Two Gram Sodium Diet 2000 mg  What is Sodium? Sodium is a mineral found naturally in many foods. The most significant source of sodium in the diet is table salt, which is about 40% sodium.  Processed, convenience, and preserved foods also contain a large amount of sodium.  The body needs only 500 mg of sodium daily to function,  A normal diet provides more than enough sodium even if you do not use salt.  Why Limit Sodium? A build up of sodium in the body can cause thirst, increased blood pressure, shortness of breath, and water retention.  Decreasing sodium in the diet can reduce edema and risk of heart attack or stroke associated with high blood pressure.  Keep in mind that there are many other factors involved in these health problems.  Heredity, obesity, lack of exercise, cigarette smoking, stress and what you eat all play a role.  General Guidelines:  Do not add salt at the table or in cooking.  One teaspoon of salt contains over 2 grams of sodium.  Read food labels  Avoid processed and convenience foods  Ask your dietitian before eating any foods not dicussed in the menu planning guidelines  Consult your physician if you wish to use a salt substitute or a sodium containing medication such as antacids.  Limit milk and  milk products to 16 oz (2 cups) per day.  Shopping Hints:  READ LABELS!! "Dietetic" does not necessarily mean low sodium.  Salt and other sodium ingredients are  often added to foods during processing.   Menu Planning Guidelines Food Group Choose More Often Avoid  Beverages (see also the milk group All fruit juices, low-sodium, salt-free vegetables juices, low-sodium carbonated beverages Regular vegetable or tomato juices, commercially softened water used for drinking or cooking  Breads and Cereals Enriched white, wheat, rye and pumpernickel bread, hard rolls and dinner rolls; muffins, cornbread and waffles; most dry cereals, cooked cereal without added salt; unsalted crackers and breadsticks; low sodium or homemade bread crumbs Bread, rolls and crackers with salted tops; quick breads; instant hot cereals; pancakes; commercial bread stuffing; self-rising flower and biscuit mixes; regular bread crumbs or cracker crumbs  Desserts and Sweets Desserts and sweets mad with mild should be within allowance Instant pudding mixes and cake mixes  Fats Butter or margarine; vegetable oils; unsalted salad dressings, regular salad dressings limited to 1 Tbs; light, sour and heavy cream Regular salad dressings containing bacon fat, bacon bits, and salt pork; snack dips made with instant soup mixes or processed cheese; salted nuts  Fruits Most fresh, frozen and canned fruits Fruits processed with salt or sodium-containing ingredient (some dried fruits are processed with sodium sulfites        Vegetables Fresh, frozen vegetables and low- sodium canned vegetables Regular canned vegetables, sauerkraut, pickled vegetables, and others prepared in brine; frozen vegetables in sauces; vegetables seasoned with ham, bacon or salt pork  Condiments, Sauces, Miscellaneous  Salt substitute with physician's approval; pepper, herbs, spices; vinegar, lemon or lime juice; hot pepper sauce; garlic powder, onion powder, low sodium soy sauce (1 Tbs.); low sodium condiments (ketchup, chili sauce, mustard) in limited amounts (1 tsp.) fresh ground horseradish; unsalted tortilla chips, pretzels,  potato chips, popcorn, salsa (1/4 cup) Any seasoning made with salt including garlic salt, celery salt, onion salt, and seasoned salt; sea salt, rock salt, kosher salt; meat tenderizers; monosodium glutamate; mustard, regular soy sauce, barbecue, sauce, chili sauce, teriyaki sauce, steak sauce, Worcestershire sauce, and most flavored vinegars; canned gravy and mixes; regular condiments; salted snack foods, olives, picles, relish, horseradish sauce, catsup   Food preparation: Try these seasonings Meats:    Pork Sage, onion Serve with applesauce  Chicken Poultry seasoning, thyme, parsley Serve with cranberry sauce  Lamb Curry powder, rosemary, garlic, thyme Serve with mint sauce or jelly  Veal Marjoram, basil Serve with current jelly, cranberry sauce  Beef Pepper, bay leaf Serve with dry mustard, unsalted chive butter  Fish [place] leaf, dill Serve with unsalted lemon butter, unsalted parsley butter  Vegetables:    Asparagus Lemon juice   Broccoli Lemon juice   Carrots Mustard dressing parsley, mint, nutmeg, glazed with unsalted butter and sugar   Green beans Marjoram, lemon juice, nutmeg,dill seed   Tomatoes Basil, marjoram, onion   Spice /blend for [employer]" 4 tsp ground thyme 1 tsp ground sage 3 tsp ground rosemary 4 tsp ground marjoram   Test your knowledge 1. A product that says "Salt Free" may still contain sodium. True or False 2. Garlic Powder and Hot Pepper Sauce an be used as alternative seasonings.True or False 3. Processed foods have more sodium than fresh foods.  True or False 4. Canned Vegetables have less sodium than froze True or False  WAYS TO DECREASE YOUR SODIUM INTAKE 1. Avoid the use of added salt in cooking and at  the table.  Table salt (and other prepared seasonings which contain salt) is probably one of the greatest sources of sodium in the diet.  Unsalted foods can gain flavor from the sweet, sour, and butter taste sensations of herbs and spices.  Instead of using  salt for seasoning, try the following seasonings with the foods listed.  Remember: how you use them to enhance natural food flavors is limited only by your creativity... Allspice-Meat, fish, eggs, fruit, peas, red and yellow vegetables Almond Extract-Fruit baked goods Anise Seed-Sweet breads, fruit, carrots, beets, cottage cheese, cookies (tastes like licorice) Basil-Meat, fish, eggs, vegetables, rice, vegetables salads, soups, sauces Bay Leaf-Meat, fish, stews, poultry Burnet-Salad, vegetables (cucumber-like flavor) Caraway Seed-Bread, cookies, cottage cheese, meat, vegetables, cheese, rice Cardamon-Baked goods, fruit, soups Celery Powder or seed-Salads, salad dressings, sauces, meatloaf, soup, bread.Do not use  celery salt Chervil-Meats, salads, fish, eggs, vegetables, cottage cheese (parsley-like flavor) Chili Power-Meatloaf, chicken cheese, corn, eggplant, egg dishes Chives-Salads cottage cheese, egg dishes, soups, vegetables, sauces Cilantro-Salsa, casseroles Cinnamon-Baked goods, fruit, pork, lamb, chicken, carrots Cloves-Fruit, baked goods, fish, pot roast, green beans, beets, carrots Coriander-Pastry, cookies, meat, salads, cheese (lemon-orange flavor) Cumin-Meatloaf, fish,cheese, eggs, cabbage,fruit pie (caraway flavor) [employer], fruit, eggs, fish, poultry, cottage cheese, vegetables Dill Seed-Meat, cottage cheese, poultry, vegetables, fish, salads, bread Fennel Seed-Bread, cookies, apples, pork, eggs, fish, beets, cabbage, cheese, Licorice-like flavor Garlic-(buds or powder) Salads, meat, poultry, fish, bread, butter, vegetables, potatoes.Do not  use garlic salt Ginger-Fruit, vegetables, baked goods, meat, fish, poultry Horseradish Root-Meet, vegetables, butter Lemon Juice or Extract-Vegetables, fruit, tea, baked goods, fish salads Mace-Baked goods fruit, vegetables, fish, poultry (taste like nutmeg) Maple Extract-Syrups Marjoram-Meat, chicken, fish, vegetables, breads,  green salads (taste like Sage) Mint-Tea, lamb, sherbet, vegetables, desserts, carrots, cabbage Mustard, Dry or Seed-Cheese, eggs, meats, vegetables, poultry Nutmeg-Baked goods, fruit, chicken, eggs, vegetables, desserts Onion Powder-Meat, fish, poultry, vegetables, cheese, eggs, bread, rice salads (Do not use   Onion salt) Orange Extract-Desserts, baked goods Oregano-Pasta, eggs, cheese, onions, pork, lamb, fish, chicken, vegetables, green salads Paprika-Meat, fish, poultry, eggs, cheese, vegetables Parsley Flakes-Butter, vegetables, meat fish, poultry, eggs, bread, salads (certain forms may   Contain sodium Pepper-Meat fish, poultry, vegetables, eggs Peppermint Extract-Desserts, baked goods Poppy Seed-Eggs, bread, cheese, fruit dressings, baked goods, noodles, vegetables, cottage  [employer], poultry, meat, fish, cauliflower, turnips,eggs bread Saffron-Rice, bread, veal, chicken, fish, eggs Sage-Meat, fish, poultry, onions, eggplant, tomateos, pork, stews Savory-Eggs, salads, poultry, meat, rice, vegetables, soups, pork Tarragon-Meat, poultry, fish, eggs, butter, vegetables (licorice-like flavor)  Thyme-Meat, poultry, fish, eggs, vegetables, (clover-like flavor), sauces, soups Tumeric-Salads, butter, eggs, fish, rice, vegetables (saffron-like flavor) Vanilla Extract-Baked goods, candy Vinegar-Salads, vegetables, meat marinades Walnut Extract-baked goods, candy  2. Choose your Foods Wisely   The following is a list of foods to avoid which are high in sodium:  Meats-Avoid all smoked, canned, salt cured, dried and kosher meat and fish as well as Anchovies   [employer] meats:Bologna, Liverwurst, Pastrami Canned meat or fish  Marinated herring Caviar    Pepperoni Corned Beef   Pizza Dried chipped beef  Salami Frozen breaded fish or meat Salt pork Frankfurters or hot dogs  Sardines Gefilte fish   Sausage Ham (boiled ham,  Proscuitto Smoked butt    spiced ham)   Spam      TV Dinners Vegetables Canned vegetables (Regular) Relish Canned mushrooms  Sauerkraut Olives    Tomato juice Pickles  Bakery and Dessert Products Canned puddings  Cream pies Cheesecake  Decorated cakes Cookies  Beverages/Juices Tomato juice, regular  Gatorade   V-8 vegetable juice, regular  Breads and Cereals Biscuit mixes   Salted potato chips, corn chips, pretzels Bread stuffing mixes  Salted crackers and rolls Pancake and waffle mixes Self-rising flour  Seasonings Accent    Meat sauces Barbecue sauce  Meat tenderizer Catsup    Monosodium glutamate (MSG) Celery salt   Onion salt Chili sauce   Prepared mustard Garlic salt   Salt, seasoned salt, sea salt Gravy mixes   Soy sauce Horseradish   Steak sauce Ketchup   Tartar sauce Lite salt    Teriyaki sauce Marinade mixes   Worcestershire sauce  Others Baking powder   Cocoa and cocoa mixes Baking soda   Commercial casserole mixes Candy-caramels, chocolate  Dehydrated soups    Bars, fudge,nougats  Instant rice and pasta mixes Canned broth or soup  Maraschino cherries Cheese, aged and processed cheese and cheese spreads  Learning Assessment Quiz  Indicated T (for True) or F (for False) for each of the following statements:  1. _____ Fresh fruits and vegetables and unprocessed grains are generally low in sodium 2. _____ Water may contain a considerable amount of sodium, depending on the source 3. _____ You can always tell if a food is high in sodium by tasting it 4. _____ Certain laxatives my be high in sodium and should be avoided unless prescribed   by a physician or pharmacist 5. _____ Salt substitutes may be used freely by anyone on a sodium restricted diet 6. _____ Sodium is present in table salt, food additives and as a natural component of   most foods 7. _____ Table salt is approximately 90% sodium 8. _____ Limiting sodium intake may help prevent excess fluid  accumulation in the body 9. _____ On a sodium-restricted diet, seasonings such as bouillon soy sauce, and    cooking wine should be used in place of table salt 10. _____ On an ingredient list, a product which lists monosodium glutamate as the first   ingredient is an appropriate food to include on a low sodium diet  Circle the best answer(s) to the following statements (Hint: there may be more than one correct answer)  11. On a low-sodium diet, some acceptable snack items are:    A. Olives  F. Bean dip   K. Grapefruit juice    B. Salted Pretzels G. Commercial Popcorn   L. Canned peaches    C. Carrot Sticks  H. Bouillon   M. Unsalted nuts   D. [place] fries  I. Peanut butter crackers N. Salami   E. Sweet pickles J. Tomato Juice   O. Pizza  12.  Seasonings that may be used freely on a reduced - sodium diet include   A. Lemon wedges F.Monosodium glutamate K. Celery seed    B.Soysauce   G. Pepper   L. Mustard powder   C. Sea salt  H. Cooking wine  M. Onion flakes   D. Vinegar  E. Prepared horseradish N. Salsa   E. Sage   J. Worcestershire sauce  O. Chutney       Thank you for choosing [place] [facility] !            Sumner Boast, PA-C  03/18/2019 12:12 PM    [facility] [place], [place], [place]  [address] Phone: [phone]; Fax: [phone]

## 2019-03-18 ENCOUNTER — Encounter: Payer: Self-pay | Admitting: Physician Assistant

## 2019-03-18 ENCOUNTER — Other Ambulatory Visit: Payer: Self-pay

## 2019-03-18 ENCOUNTER — Ambulatory Visit: Payer: Medicare HMO | Admitting: Physician Assistant

## 2019-03-18 VITALS — BP 141/91 | HR 85 | Temp 99.2°F | Ht 66.0 in | Wt 183.0 lb

## 2019-03-18 DIAGNOSIS — I6523 Occlusion and stenosis of bilateral carotid arteries: Secondary | ICD-10-CM

## 2019-03-18 DIAGNOSIS — I480 Paroxysmal atrial fibrillation: Secondary | ICD-10-CM | POA: Diagnosis not present

## 2019-03-18 DIAGNOSIS — I10 Essential (primary) hypertension: Secondary | ICD-10-CM

## 2019-03-18 MED ORDER — AMLODIPINE BESYLATE 5 MG PO TABS: 5.0000 mg | ORAL_TABLET | Freq: Every morning | ORAL | 3 refills | Status: DC

## 2019-03-18 NOTE — Patient Instructions (Addendum)
Medication Instructions:  INCREASE Amlodipine to 5 mg daily   Take your HCTZ 2 tablets TODAY and TONORROW, then resume one daily.3 *If you need a refill on your cardiac medications before your next appointment, please call your pharmacy*  Lab Work: None If you have labs (blood work) drawn today and your tests are completely normal, you will receive your results only by: [name] MyChart Message (if you have MyChart) OR . A paper copy in the mail If you have any lab test that is abnormal or we need to change your treatment, we will call you to review the results.  Testing/Procedures: None  Follow-Up: At [facility], you and your health needs are our priority.  As part of our continuing mission to provide you with exceptional heart care, we have created designated Provider Care Teams.  These Care Teams include your primary Cardiologist (physician) and Advanced Practice Providers (APPs -  Physician Assistants and Nurse Practitioners) who all work together to provide you with the care you need, when you need it.  Your next appointment:   1 month(s)  The format for your next appointment:   In Person  Provider:   Carlyle Dolly, MD  Other Instructions Two Gram Sodium Diet 2000 mg  What is Sodium? Sodium is a mineral found naturally in many foods. The most significant source of sodium in the diet is table salt, which is about 40% sodium.  Processed, convenience, and preserved foods also contain a large amount of sodium.  The body needs only 500 mg of sodium daily to function,  A normal diet provides more than enough sodium even if you do not use salt.  Why Limit Sodium? A build up of sodium in the body can cause thirst, increased blood pressure, shortness of breath, and water retention.  Decreasing sodium in the diet can reduce edema and risk of heart attack or stroke associated with high blood pressure.  Keep in mind that there are many other factors involved in these health problems.   Heredity, obesity, lack of exercise, cigarette smoking, stress and what you eat all play a role.  General Guidelines:  Do not add salt at the table or in cooking.  One teaspoon of salt contains over 2 grams of sodium.  Read food labels  Avoid processed and convenience foods  Ask your dietitian before eating any foods not dicussed in the menu planning guidelines  Consult your physician if you wish to use a salt substitute or a sodium containing medication such as antacids.  Limit milk and milk products to 16 oz (2 cups) per day.  Shopping Hints:  READ LABELS!! "Dietetic" does not necessarily mean low sodium.  Salt and other sodium ingredients are often added to foods during processing.   Menu Planning Guidelines Food Group Choose More Often Avoid  Beverages (see also the milk group All fruit juices, low-sodium, salt-free vegetables juices, low-sodium carbonated beverages Regular vegetable or tomato juices, commercially softened water used for drinking or cooking  Breads and Cereals Enriched white, wheat, rye and pumpernickel bread, hard rolls and dinner rolls; muffins, cornbread and waffles; most dry cereals, cooked cereal without added salt; unsalted crackers and breadsticks; low sodium or homemade bread crumbs Bread, rolls and crackers with salted tops; quick breads; instant hot cereals; pancakes; commercial bread stuffing; self-rising flower and biscuit mixes; regular bread crumbs or cracker crumbs  Desserts and Sweets Desserts and sweets mad with mild should be within allowance Instant pudding mixes and cake mixes  Fats Butter  or margarine; vegetable oils; unsalted salad dressings, regular salad dressings limited to 1 Tbs; light, sour and heavy cream Regular salad dressings containing bacon fat, bacon bits, and salt pork; snack dips made with instant soup mixes or processed cheese; salted nuts  Fruits Most fresh, frozen and canned fruits Fruits processed with salt or sodium-containing  ingredient (some dried fruits are processed with sodium sulfites        Vegetables Fresh, frozen vegetables and low- sodium canned vegetables Regular canned vegetables, sauerkraut, pickled vegetables, and others prepared in brine; frozen vegetables in sauces; vegetables seasoned with ham, bacon or salt pork  Condiments, Sauces, Miscellaneous  Salt substitute with physician's approval; pepper, herbs, spices; vinegar, lemon or lime juice; hot pepper sauce; garlic powder, onion powder, low sodium soy sauce (1 Tbs.); low sodium condiments (ketchup, chili sauce, mustard) in limited amounts (1 tsp.) fresh ground horseradish; unsalted tortilla chips, pretzels, potato chips, popcorn, salsa (1/4 cup) Any seasoning made with salt including garlic salt, celery salt, onion salt, and seasoned salt; sea salt, rock salt, kosher salt; meat tenderizers; monosodium glutamate; mustard, regular soy sauce, barbecue, sauce, chili sauce, teriyaki sauce, steak sauce, Worcestershire sauce, and most flavored vinegars; canned gravy and mixes; regular condiments; salted snack foods, olives, picles, relish, horseradish sauce, catsup   Food preparation: Try these seasonings Meats:    Pork Sage, onion Serve with applesauce  Chicken Poultry seasoning, thyme, parsley Serve with cranberry sauce  Lamb Curry powder, rosemary, garlic, thyme Serve with mint sauce or jelly  Veal Marjoram, basil Serve with current jelly, cranberry sauce  Beef Pepper, bay leaf Serve with dry mustard, unsalted chive butter  Fish Bay leaf, dill Serve with unsalted lemon butter, unsalted parsley butter  Vegetables:    Asparagus Lemon juice   Broccoli Lemon juice   Carrots Mustard dressing parsley, mint, nutmeg, glazed with unsalted butter and sugar   Green beans Marjoram, lemon juice, nutmeg,dill seed   Tomatoes Basil, marjoram, onion   Spice /blend for [employer]" 4 tsp ground thyme 1 tsp ground sage 3 tsp ground rosemary 4 tsp ground marjoram    Test your knowledge 1. A product that says "Salt Free" may still contain sodium. True or False 2. Garlic Powder and Hot Pepper Sauce an be used as alternative seasonings.True or False 3. Processed foods have more sodium than fresh foods.  True or False 4. Canned Vegetables have less sodium than froze True or False  WAYS TO DECREASE YOUR SODIUM INTAKE 1. Avoid the use of added salt in cooking and at the table.  Table salt (and other prepared seasonings which contain salt) is probably one of the greatest sources of sodium in the diet.  Unsalted foods can gain flavor from the sweet, sour, and butter taste sensations of herbs and spices.  Instead of using salt for seasoning, try the following seasonings with the foods listed.  Remember: how you use them to enhance natural food flavors is limited only by your creativity... Allspice-Meat, fish, eggs, fruit, peas, red and yellow vegetables Almond Extract-Fruit baked goods Anise Seed-Sweet breads, fruit, carrots, beets, cottage cheese, cookies (tastes like licorice) Basil-Meat, fish, eggs, vegetables, rice, vegetables salads, soups, sauces Bay Leaf-Meat, fish, stews, poultry Burnet-Salad, vegetables (cucumber-like flavor) Caraway Seed-Bread, cookies, cottage cheese, meat, vegetables, cheese, rice Cardamon-Baked goods, fruit, soups Celery Powder or seed-Salads, salad dressings, sauces, meatloaf, soup, bread.Do not use  celery salt Chervil-Meats, salads, fish, eggs, vegetables, cottage cheese (parsley-like flavor) Chili Power-Meatloaf, chicken cheese, corn, eggplant, egg dishes Chives-Salads cottage  cheese, egg dishes, soups, vegetables, sauces Cilantro-Salsa, casseroles Cinnamon-Baked goods, fruit, pork, lamb, chicken, carrots Cloves-Fruit, baked goods, fish, pot roast, green beans, beets, carrots Coriander-Pastry, cookies, meat, salads, cheese (lemon-orange flavor) Cumin-Meatloaf, fish,cheese, eggs, cabbage,fruit pie (caraway flavor) [employer], fruit, eggs, fish, poultry, cottage cheese, vegetables Dill Seed-Meat, cottage cheese, poultry, vegetables, fish, salads, bread Fennel Seed-Bread, cookies, apples, pork, eggs, fish, beets, cabbage, cheese, Licorice-like flavor Garlic-(buds or powder) Salads, meat, poultry, fish, bread, butter, vegetables, potatoes.Do not  use garlic salt Ginger-Fruit, vegetables, baked goods, meat, fish, poultry Horseradish Root-Meet, vegetables, butter Lemon Juice or Extract-Vegetables, fruit, tea, baked goods, fish salads Mace-Baked goods fruit, vegetables, fish, poultry (taste like nutmeg) Maple Extract-Syrups Marjoram-Meat, chicken, fish, vegetables, breads, green salads (taste like Sage) Mint-Tea, lamb, sherbet, vegetables, desserts, carrots, cabbage Mustard, Dry or Seed-Cheese, eggs, meats, vegetables, poultry Nutmeg-Baked goods, fruit, chicken, eggs, vegetables, desserts Onion Powder-Meat, fish, poultry, vegetables, cheese, eggs, bread, rice salads (Do not use   Onion salt) Orange Extract-Desserts, baked goods Oregano-Pasta, eggs, cheese, onions, pork, lamb, fish, chicken, vegetables, green salads Paprika-Meat, fish, poultry, eggs, cheese, vegetables Parsley Flakes-Butter, vegetables, meat fish, poultry, eggs, bread, salads (certain forms may   Contain sodium Pepper-Meat fish, poultry, vegetables, eggs Peppermint Extract-Desserts, baked goods Poppy Seed-Eggs, bread, cheese, fruit dressings, baked goods, noodles, vegetables, cottage  [employer], poultry, meat, fish, cauliflower, turnips,eggs bread Saffron-Rice, bread, veal, chicken, fish, eggs Sage-Meat, fish, poultry, onions, eggplant, tomateos, pork, stews Savory-Eggs, salads, poultry, meat, rice, vegetables, soups, pork Tarragon-Meat, poultry, fish, eggs, butter, vegetables (licorice-like flavor)  Thyme-Meat, poultry, fish, eggs, vegetables, (clover-like flavor), sauces,  soups Tumeric-Salads, butter, eggs, fish, rice, vegetables (saffron-like flavor) Vanilla Extract-Baked goods, candy Vinegar-Salads, vegetables, meat marinades Walnut Extract-baked goods, candy  2. Choose your Foods Wisely   The following is a list of foods to avoid which are high in sodium:  Meats-Avoid all smoked, canned, salt cured, dried and kosher meat and fish as well as Anchovies   [employer] meats:Bologna, Liverwurst, Pastrami Canned meat or fish  Marinated herring Caviar    Pepperoni Corned Beef   Pizza Dried chipped beef  Salami Frozen breaded fish or meat Salt pork Frankfurters or hot dogs  Sardines Gefilte fish   Sausage Ham (boiled ham, Proscuitto Smoked butt    spiced ham)   Spam      TV Dinners Vegetables Canned vegetables (Regular) Relish Canned mushrooms  Sauerkraut Olives    Tomato juice Pickles  Bakery and Dessert Products Canned puddings  Cream pies Cheesecake   Decorated cakes Cookies  Beverages/Juices Tomato juice, regular  Gatorade   V-8 vegetable juice, regular  Breads and Cereals Biscuit mixes   Salted potato chips, corn chips, pretzels Bread stuffing mixes  Salted crackers and rolls Pancake and waffle mixes Self-rising flour  Seasonings Accent    Meat sauces Barbecue sauce  Meat tenderizer Catsup    Monosodium glutamate (MSG) Celery salt   Onion salt Chili sauce   Prepared mustard Garlic salt   Salt, seasoned salt, sea salt Gravy mixes   Soy sauce Horseradish   Steak sauce Ketchup   Tartar sauce Lite salt    Teriyaki sauce Marinade mixes   Worcestershire sauce  Others Baking powder   Cocoa and cocoa mixes Baking soda   Commercial casserole mixes Candy-caramels, chocolate  Dehydrated soups    Bars, fudge,nougats  Instant rice and pasta mixes Canned broth or soup  Maraschino cherries Cheese, aged and processed cheese and cheese spreads  Learning  Assessment Quiz  Indicated T (for True) or F (for False) for each of the  following statements:  1. _____ Fresh fruits and vegetables and unprocessed grains are generally low in sodium 2. _____ Water may contain a considerable amount of sodium, depending on the source 3. _____ You can always tell if a food is high in sodium by tasting it 4. _____ Certain laxatives my be high in sodium and should be avoided unless prescribed   by a physician or pharmacist 5. _____ Salt substitutes may be used freely by anyone on a sodium restricted diet 6. _____ Sodium is present in table salt, food additives and as a natural component of   most foods 7. _____ Table salt is approximately 90% sodium 8. _____ Limiting sodium intake may help prevent excess fluid accumulation in the body 9. _____ On a sodium-restricted diet, seasonings such as bouillon soy sauce, and    cooking wine should be used in place of table salt 10. _____ On an ingredient list, a product which lists monosodium glutamate as the first   ingredient is an appropriate food to include on a low sodium diet  Circle the best answer(s) to the following statements (Hint: there may be more than one correct answer)  11. On a low-sodium diet, some acceptable snack items are:    A. Olives  F. Bean dip   K. Grapefruit juice    B. Salted Pretzels G. Commercial Popcorn   L. Canned peaches    C. Carrot Sticks  H. Bouillon   M. Unsalted nuts   D. [place] fries  I. Peanut butter crackers N. Salami   E. Sweet pickles J. Tomato Juice   O. Pizza  12.  Seasonings that may be used freely on a reduced - sodium diet include   A. Lemon wedges F.Monosodium glutamate K. Celery seed    B.Soysauce   G. Pepper   L. Mustard powder   C. Sea salt  H. Cooking wine  M. Onion flakes   D. Vinegar  E. Prepared horseradish N. Salsa   E. Sage   J. Worcestershire sauce  O. Chutney       Thank you for choosing [facility] !

## 2019-03-19 DIAGNOSIS — Z85828 Personal history of other malignant neoplasm of skin: Secondary | ICD-10-CM | POA: Diagnosis not present

## 2019-03-19 DIAGNOSIS — L570 Actinic keratosis: Secondary | ICD-10-CM | POA: Diagnosis not present

## 2019-03-19 DIAGNOSIS — X32XXXD Exposure to sunlight, subsequent encounter: Secondary | ICD-10-CM | POA: Diagnosis not present

## 2019-03-19 DIAGNOSIS — C44622 Squamous cell carcinoma of skin of right upper limb, including shoulder: Secondary | ICD-10-CM | POA: Diagnosis not present

## 2019-03-19 DIAGNOSIS — Z08 Encounter for follow-up examination after completed treatment for malignant neoplasm: Secondary | ICD-10-CM | POA: Diagnosis not present

## 2019-04-15 ENCOUNTER — Encounter: Payer: Self-pay | Admitting: Cardiology

## 2019-04-15 ENCOUNTER — Ambulatory Visit: Payer: Medicare HMO | Admitting: Cardiology

## 2019-04-15 VITALS — BP 134/83 | HR 86 | Temp 98.2°F | Ht 66.0 in | Wt 178.0 lb

## 2019-04-15 DIAGNOSIS — I480 Paroxysmal atrial fibrillation: Secondary | ICD-10-CM

## 2019-04-15 DIAGNOSIS — I10 Essential (primary) hypertension: Secondary | ICD-10-CM | POA: Diagnosis not present

## 2019-04-15 DIAGNOSIS — I6523 Occlusion and stenosis of bilateral carotid arteries: Secondary | ICD-10-CM | POA: Diagnosis not present

## 2019-04-15 DIAGNOSIS — Z79899 Other long term (current) drug therapy: Secondary | ICD-10-CM | POA: Diagnosis not present

## 2019-04-15 DIAGNOSIS — I4821 Permanent atrial fibrillation: Secondary | ICD-10-CM

## 2019-04-15 NOTE — Progress Notes (Signed)
Cardiology Office Note  Date: 04/15/2019   ID: Bailey Wilson, DOB [birth year], MRN [id number]  PCP:  Sharilyn Sites, MD  Cardiologist:  No primary care provider on file. Electrophysiologist:  None   Chief Complaint  Patient presents with  . Atrial Fibrillation    History of Present Illness: Bailey Wilson is a 79 y.o. female last seen in the office by [name] in early February, previously by [name].  This is our first meeting today.  I reviewed her records and updated the chart.  She presents for a routine visit, denies any sense of palpitations or chest pain.  She reports stable energy capacity with typical ADLs.  CHA2DS2-VASc  score is 5.  She has been taking Eliquis since January.  She notes occasional mild hemorrhoidal bleeding, no melena.  I reviewed the remainder of her medications which are stable and outlined below.  Norvasc dose was increased to 5 mg daily at the last visit.  Blood pressure is better controlled today.  Past Medical History:  Diagnosis Date  . Anemia   . Carotid stenosis 06/03/2016   1-39% bilateral ICA stenosis 05/2016  . Essential hypertension   . GERD (gastroesophageal reflux disease)   . History of pneumonia   . Osteoarthritis    Right knee  . Paroxysmal atrial fibrillation (HCC)   . Seasonal allergies     Past Surgical History:  Procedure Laterality Date  . ABDOMINAL HYSTERECTOMY  1985  . CATARACT EXTRACTION W/ INTRAOCULAR LENS  IMPLANT, BILATERAL Bilateral 2002-2007   "left-right"  . COLONOSCOPY    . [place]   "prior to hysterectomy"  . FRACTURE SURGERY    . JOINT REPLACEMENT    . NASAL FRACTURE SURGERY  2016  . REPLACEMENT TOTAL KNEE Right   . TONSILLECTOMY    . 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 TOTAL HIP ARTHROPLASTY Right 07/04/2016   Procedure: TOTAL HIP ARTHROPLASTY ANTERIOR APPROACH;  Surgeon: Frederik Pear, MD;  Location: [facility];  Service: Orthopedics;  Laterality: Right;  .  TOTAL KNEE ARTHROPLASTY Right 05/10/2017   Procedure: RIGHT TOTAL KNEE ARTHROPLASTY;  Surgeon: Frederik Pear, MD;  Location: [facility];  Service: Orthopedics;  Laterality: Right;    Current Outpatient Medications  Medication Sig Dispense Refill  . albuterol (PROVENTIL HFA;VENTOLIN HFA) 108 (90 Base) MCG/ACT inhaler Inhale 2 puffs into the lungs every 6 (six) hours as needed for wheezing or shortness of breath.    [name] amLODipine (NORVASC) 5 MG tablet Take 1 tablet (5 mg total) by mouth every morning. 90 tablet 3  . apixaban (ELIQUIS) 5 MG TABS tablet Take 1 tablet (5 mg total) by mouth 2 (two) times daily. 60 tablet 11  . ARTIFICIAL TEAR OP Apply 2 drops to eye every morning.     . AZO-CRANBERRY PO Take 2 tablets by mouth every morning.     . Calcium 600-200 MG-UNIT tablet Take 1 tablet by mouth daily.    . calcium carbonate (TUMS - DOSED IN MG ELEMENTAL CALCIUM) 500 MG chewable tablet Chew 1 tablet by mouth daily as needed for indigestion or heartburn.    . cetirizine (ZYRTEC) 10 MG tablet Take 10 mg by mouth every morning.     . docusate sodium (COLACE) 100 MG capsule Take 100 mg by mouth daily as needed for mild constipation.     . hydrochlorothiazide (HYDRODIURIL) 25 MG tablet Take 25 mg by mouth every morning.     [name]  metoprolol tartrate (LOPRESSOR) 25 MG tablet Take 1 tablet (25 mg total) by mouth 2 (two) times daily. 180 tablet 3  . Multiple Vitamins-Minerals (ICAPS MV PO) Take 1 capsule by mouth every morning.     . sodium chloride (OCEAN) 0.65 % SOLN nasal spray Place 1 spray into both nostrils as needed for congestion.     No current facility-administered medications for this visit.   Allergies:  Penicillins, Ace inhibitors, Aspirin, and Sulfa antibiotics   Social History: The patient  reports that she has never smoked. She has never used smokeless tobacco. She reports that she does not drink alcohol or use drugs.   Family History: The patient's family history includes Arrhythmia in her  mother; Asthma in her mother; Blindness in her paternal grandmother; Clotting disorder in her father; Heart disease in her father and mother; Hypertension in her paternal grandfather.   ROS:   No dizziness or syncope.  Physical Exam: VS:  BP 134/83   Pulse 86   Temp 98.2 F (36.8 C)   Ht 5\' 6"  (1.676 m)   Wt 178 lb (80.7 kg)   SpO2 98%   BMI 28.73 kg/m , BMI Body mass index is 28.73 kg/m.  Wt Readings from Last 3 Encounters:  04/15/19 178 lb (80.7 kg)  03/18/19 183 lb (83 kg)  02/28/19 179 lb 12.8 oz (81.6 kg)    General: Elderly woman, appears comfortable at rest. HEENT: Conjunctiva and lids normal, wearing a mask. Neck: Supple, no elevated JVP or carotid bruits, no thyromegaly. Lungs: Clear to auscultation, nonlabored breathing at rest. Cardiac: Irregularly irregular, no S3, soft systolic murmur. Abdomen: Soft, nontender, bowel sounds present. Extremities: No pitting edema, distal pulses 2+. Skin: Warm and dry. Musculoskeletal: No kyphosis. Neuropsychiatric: Alert and oriented x3, affect grossly appropriate.  ECG:  An ECG dated 02/28/2019 was personally reviewed today and demonstrated:  Atrial fibrillation at 105 bpm, poor R wave progression, nonspecific ST changes.  Recent Labwork: 02/28/2019: ALT 11; AST 17; BUN 16; Creatinine, Ser 1.00; Hemoglobin 12.4; Magnesium 1.6; Platelets 260; Potassium 4.1; Sodium 137; TSH 1.370   Other Studies Reviewed Today:  Echocardiogram 03/12/2019: 1. Left ventricular ejection fraction, by visual estimation, is 55 to  60%. The left ventricle has normal function. There is mildly increased  left ventricular hypertrophy.  2. Left ventricular diastolic parameters are indeterminate.  3. The left ventricle has no regional wall motion abnormalities.  4. Global right ventricle has normal systolic function.The right  ventricular size is normal. No increase in right ventricular wall  thickness.  5. Left atrial size was moderately dilated.    6. Right atrial size was mildly dilated.  7. Moderate calcification of the mitral valve leaflet(s).  8. Moderate mitral annular calcification.  9. Moderate thickening of the mitral valve leaflet(s).  10. The mitral valve is normal in structure. Mild mitral valve  regurgitation. No evidence of mitral stenosis.  11. The tricuspid valve is normal in structure.  12. The tricuspid valve is normal in structure. Tricuspid valve  regurgitation is moderate.  13. The aortic valve is tricuspid. Aortic valve regurgitation is mild.  Mild aortic valve sclerosis without stenosis.  14. Pulmonic regurgitation is moderate.  15. The pulmonic valve was normal in structure. Pulmonic valve  regurgitation is moderate.  16. Aortic dilatation noted.  17. There is mild dilatation of the aortic root measuring 39 mm.  18. Moderately elevated pulmonary artery systolic pressure.  19. The inferior vena cava is normal in size with greater than  50%  respiratory variability, suggesting right atrial pressure of 3 mmHg.   Carotid Dopplers 06/30/2017: Final Interpretation:  Right Carotid: Velocities in the right ICA are consistent with a 1-39%  stenosis.   Left Carotid: Velocities in the left ICA are consistent with a 1-39%  stenosis.   Vertebrals: Bilateral vertebral arteries demonstrate antegrade flow.  Subclavians: Normal flow hemodynamics were seen in bilateral subclavian        arteries.   Assessment and Plan:  1.  Persistent atrial fibrillation based on irregular heartbeat today and diagnosis made in January.  We will manage this as permanent atrial fibrillation with heart rate control and anticoagulation.  She does not report any palpitations.  Continue Eliquis and Lopressor.  Follow-up CBC and BMET for next office visit.  2.  Mild, nonobstructive carotid artery disease, last assessed in 2019.  I note that she is not on statin therapy at this time, lipid status is uncertain.  She has been  following at [facility].  Will address this further with her at follow-up.  3.  Essential hypertension, blood pressure trend is better.  Continue current medications including Norvasc, HCTZ, and Lopressor.  Medication Adjustments/Labs and Tests Ordered: Current medicines are reviewed at length with the patient today.  Concerns regarding medicines are outlined above.   Tests Ordered: Orders Placed This Encounter  Procedures  . CBC  . Basic Metabolic Panel (BMET)    Medication Changes: No orders of the defined types were placed in this encounter.   Disposition:  Follow up 3 months in the [place] office.  Signed, Satira Sark, MD, [name] 04/15/2019 10:07 AM    [facility] [place]. [address], [place], [place] [address] Phone: [phone]; Fax: [phone]

## 2019-04-15 NOTE — Patient Instructions (Signed)
Medication Instructions:  Your physician recommends that you continue on your current medications as directed. Please refer to the Current Medication list given to you today.  *If you need a refill on your cardiac medications before your next appointment, please call your pharmacy*   Lab Work: Cbc and bmet in 3 months  If you have labs (blood work) drawn today and your tests are completely normal, you will receive your results only by: [name] MyChart Message (if you have MyChart) OR . A paper copy in the mail If you have any lab test that is abnormal or we need to change your treatment, we will call you to review the results.   Testing/Procedures: none   Follow-Up: At [facility], you and your health needs are our priority.  As part of our continuing mission to provide you with exceptional heart care, we have created designated Provider Care Teams.  These Care Teams include your primary Cardiologist (physician) and Advanced Practice Providers (APPs -  Physician Assistants and Nurse Practitioners) who all work together to provide you with the care you need, when you need it.  We recommend signing up for the patient portal called "MyChart".  Sign up information is provided on this After Visit Summary.  MyChart is used to connect with patients for Virtual Visits (Telemedicine).  Patients are able to view lab/test results, encounter notes, upcoming appointments, etc.  Non-urgent messages can be sent to your provider as well.   To learn more about what you can do with MyChart, go to [email or web address].    Your next appointment:   3 month(s)  The format for your next appointment:   In Person  Provider:   Rozann Lesches, MD   Other Instructions None       Thank you for choosing [facility] !

## 2019-04-16 DIAGNOSIS — Z85828 Personal history of other malignant neoplasm of skin: Secondary | ICD-10-CM | POA: Diagnosis not present

## 2019-04-16 DIAGNOSIS — X32XXXD Exposure to sunlight, subsequent encounter: Secondary | ICD-10-CM | POA: Diagnosis not present

## 2019-04-16 DIAGNOSIS — L570 Actinic keratosis: Secondary | ICD-10-CM | POA: Diagnosis not present

## 2019-04-16 DIAGNOSIS — Z08 Encounter for follow-up examination after completed treatment for malignant neoplasm: Secondary | ICD-10-CM | POA: Diagnosis not present

## 2019-06-11 DIAGNOSIS — L570 Actinic keratosis: Secondary | ICD-10-CM | POA: Diagnosis not present

## 2019-06-11 DIAGNOSIS — X32XXXD Exposure to sunlight, subsequent encounter: Secondary | ICD-10-CM | POA: Diagnosis not present

## 2019-06-11 DIAGNOSIS — Z85828 Personal history of other malignant neoplasm of skin: Secondary | ICD-10-CM | POA: Diagnosis not present

## 2019-06-11 DIAGNOSIS — Z08 Encounter for follow-up examination after completed treatment for malignant neoplasm: Secondary | ICD-10-CM | POA: Diagnosis not present

## 2019-07-01 DIAGNOSIS — J069 Acute upper respiratory infection, unspecified: Secondary | ICD-10-CM | POA: Diagnosis not present

## 2019-07-01 DIAGNOSIS — Z6832 Body mass index (BMI) 32.0-32.9, adult: Secondary | ICD-10-CM | POA: Diagnosis not present

## 2019-07-01 DIAGNOSIS — Z1389 Encounter for screening for other disorder: Secondary | ICD-10-CM | POA: Diagnosis not present

## 2019-07-01 DIAGNOSIS — E6609 Other obesity due to excess calories: Secondary | ICD-10-CM | POA: Diagnosis not present

## 2019-07-18 ENCOUNTER — Other Ambulatory Visit (HOSPITAL_COMMUNITY)
Admission: RE | Admit: 2019-07-18 | Discharge: 2019-07-18 | Disposition: A | Discharge status: Home / Self Care | Payer: Medicare HMO | Source: Ambulatory Visit | Attending: Cardiology | Admitting: Cardiology

## 2019-07-18 ENCOUNTER — Other Ambulatory Visit: Payer: Self-pay

## 2019-07-18 DIAGNOSIS — I480 Paroxysmal atrial fibrillation: Secondary | ICD-10-CM | POA: Insufficient documentation

## 2019-07-18 DIAGNOSIS — Z79899 Other long term (current) drug therapy: Secondary | ICD-10-CM | POA: Diagnosis not present

## 2019-07-18 LAB — BASIC METABOLIC PANEL
Anion gap: 12 (ref 5–15)
BUN: 20 mg/dL (ref 8–23)
CO2: 28 mmol/L (ref 22–32)
Calcium: 10.3 mg/dL (ref 8.9–10.3)
Chloride: 96 mmol/L — ABNORMAL LOW (ref 98–111)
Creatinine, Ser: 0.94 mg/dL (ref 0.44–1.00)
GFR calc Af Amer: >60 mL/min (ref >60)
GFR calc non Af Amer: 57 mL/min — ABNORMAL LOW (ref >60)
Glucose, Bld: 103 mg/dL — ABNORMAL HIGH (ref 70–99)
Potassium: 3.8 mmol/L (ref 3.5–5.1)
Sodium: 136 mmol/L (ref 135–145)

## 2019-07-18 LAB — CBC
HCT: 34.9 % — ABNORMAL LOW (ref 36.0–46.0)
Hemoglobin: 10.8 g/dL — ABNORMAL LOW (ref 12.0–15.0)
MCH: 26.5 pg (ref 26.0–34.0)
MCHC: 30.9 g/dL (ref 30.0–36.0)
MCV: 85.7 fL (ref 80.0–100.0)
Platelets: 249 10*3/uL (ref 150–400)
RBC: 4.07 MIL/uL (ref 3.87–5.11)
RDW: 13.7 % (ref 11.5–15.5)
WBC: 7.2 10*3/uL (ref 4.0–10.5)
nRBC: 0 % (ref 0.0–0.2)

## 2019-08-10 NOTE — Progress Notes (Signed)
Cardiology Office Note  Date: 08/12/2019   ID: Bailey, Wilson January 16, 1941, MRN [id number]  PCP:  Sharilyn Sites, MD  Cardiologist:  Rozann Lesches, MD Electrophysiologist:  None   Chief Complaint  Patient presents with  . Cardiac follow-up    History of Present Illness: Bailey Wilson is an 79 y.o. female last seen in March.  She presents for a routine visit.  She does not describe any sense of palpitations or chest pain.  Remains functional with ADLs, uses a cane, no recent falls.  She goes out most mornings to meet friends of hers for coffee.  She continues to report intermittent episodes of hematochezia, sounds like potentially hemorrhoidal bleeding, better if she uses a stool softener.  She has seen Dr. Laural Golden previously with colonoscopy several years ago.  Her most recent lab work did show a drop in hemoglobin from 12.2 down to 10.8.  She is on Eliquis for stroke prophylaxis.  We discussed getting a follow-up lipid panel with history of carotid atherosclerosis.  She is currently not on statin therapy.   Past Medical History:  Diagnosis Date  . Anemia   . Carotid stenosis 06/03/2016   1-39% bilateral ICA stenosis 05/2016  . Essential hypertension   . GERD (gastroesophageal reflux disease)   . History of pneumonia   . Osteoarthritis    Right knee  . Paroxysmal atrial fibrillation (HCC)   . Seasonal allergies     Past Surgical History:  Procedure Laterality Date  . ABDOMINAL HYSTERECTOMY  1985  . CATARACT EXTRACTION W/ INTRAOCULAR LENS  IMPLANT, BILATERAL Bilateral 2002-2007   "left-right"  . COLONOSCOPY    . [place]   "prior to hysterectomy"  . FRACTURE SURGERY    . JOINT REPLACEMENT    . NASAL FRACTURE SURGERY  2016  . REPLACEMENT TOTAL KNEE Right   . TONSILLECTOMY    . 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 TOTAL HIP ARTHROPLASTY Right 07/04/2016   Procedure: TOTAL HIP ARTHROPLASTY ANTERIOR APPROACH;  Surgeon: Frederik Pear, MD;  Location: [place];  Service: Orthopedics;  Laterality: Right;  . TOTAL KNEE ARTHROPLASTY Right 05/10/2017   Procedure: RIGHT TOTAL KNEE ARTHROPLASTY;  Surgeon: Frederik Pear, MD;  Location: [place];  Service: Orthopedics;  Laterality: Right;    Current Outpatient Medications  Medication Sig Dispense Refill  . albuterol (PROVENTIL HFA;VENTOLIN HFA) 108 (90 Base) MCG/ACT inhaler Inhale 2 puffs into the lungs every 6 (six) hours as needed for wheezing or shortness of breath.    [name] amLODipine (NORVASC) 5 MG tablet Take 1 tablet (5 mg total) by mouth every morning. 90 tablet 3  . apixaban (ELIQUIS) 5 MG TABS tablet Take 1 tablet (5 mg total) by mouth 2 (two) times daily. 60 tablet 11  . ARTIFICIAL TEAR OP Apply 2 drops to eye every morning.     . AZO-CRANBERRY PO Take 2 tablets by mouth every morning.     . Calcium 600-200 MG-UNIT tablet Take 1 tablet by mouth daily.    . calcium carbonate (TUMS - DOSED IN MG ELEMENTAL CALCIUM) 500 MG chewable tablet Chew 1 tablet by mouth daily as needed for indigestion or heartburn.    . cetirizine (ZYRTEC) 10 MG tablet Take 10 mg by mouth every morning.     . docusate sodium (COLACE) 100 MG capsule Take 100 mg by mouth daily as needed for mild constipation.     . hydrochlorothiazide (HYDRODIURIL) 25 MG tablet Take  25 mg by mouth every morning.     . metoprolol tartrate (LOPRESSOR) 25 MG tablet Take 1 tablet (25 mg total) by mouth 2 (two) times daily. 180 tablet 3  . Multiple Vitamins-Minerals (ICAPS MV PO) Take 1 capsule by mouth every morning.     . sodium chloride (OCEAN) 0.65 % SOLN nasal spray Place 1 spray into both nostrils as needed for congestion.     No current facility-administered medications for this visit.   Allergies:  Penicillins, Ace inhibitors, Aspirin, and Sulfa antibiotics   ROS:   Arthritic pains.  Physical Exam: VS:  BP 138/80   Pulse 82   Ht 5\' 6"  (1.676 m)   Wt 180 lb 6.4 oz (81.8 kg)   SpO2 98%   BMI 29.12 kg/m , BMI Body  mass index is 29.12 kg/m.  Wt Readings from Last 3 Encounters:  08/12/19 180 lb 6.4 oz (81.8 kg)  04/15/19 178 lb (80.7 kg)  03/18/19 183 lb (83 kg)    General: Elderly woman, appears comfortable at rest. HEENT: Conjunctiva and lids normal, wearing a mask. Neck: Supple, no elevated JVP or carotid bruits, no thyromegaly. Lungs: Clear to auscultation, nonlabored breathing at rest. Cardiac: Irregularly irregular, no S3, soft systolic murmur, no pericardial rub. Extremities: No pitting edema, distal pulses 2+.  ECG:  An ECG dated 02/28/2019 was personally reviewed today and demonstrated:  Atrial fibrillation with poor R wave progression and nonspecific ST changes.  Recent Labwork: 02/28/2019: ALT 11; AST 17; Magnesium 1.6; TSH 1.370 07/18/2019: BUN 20; Creatinine, Ser 0.94; Hemoglobin 10.8; Platelets 249; Potassium 3.8; Sodium 136   Other Studies Reviewed Today:  Echocardiogram 03/12/2019: 1. Left ventricular ejection fraction, by visual estimation, is 55 to  60%. The left ventricle has normal function. There is mildly increased  left ventricular hypertrophy.  2. Left ventricular diastolic parameters are indeterminate.  3. The left ventricle has no regional wall motion abnormalities.  4. Global right ventricle has normal systolic function.The right  ventricular size is normal. No increase in right ventricular wall  thickness.  5. Left atrial size was moderately dilated.  6. Right atrial size was mildly dilated.  7. Moderate calcification of the mitral valve leaflet(s).  8. Moderate mitral annular calcification.  9. Moderate thickening of the mitral valve leaflet(s).  10. The mitral valve is normal in structure. Mild mitral valve  regurgitation. No evidence of mitral stenosis.  11. The tricuspid valve is normal in structure.  12. The tricuspid valve is normal in structure. Tricuspid valve  regurgitation is moderate.  13. The aortic valve is tricuspid. Aortic valve  regurgitation is mild.  Mild aortic valve sclerosis without stenosis.  14. Pulmonic regurgitation is moderate.  15. The pulmonic valve was normal in structure. Pulmonic valve  regurgitation is moderate.  16. Aortic dilatation noted.  17. There is mild dilatation of the aortic root measuring 39 mm.  18. Moderately elevated pulmonary artery systolic pressure.  19. The inferior vena cava is normal in size with greater than 50%  respiratory variability, suggesting right atrial pressure of 3 mmHg.   Carotid Dopplers 06/30/2017: Final Interpretation:  Right Carotid: Velocities in the right ICA are consistent with a 1-39%  stenosis.   Left Carotid: Velocities in the left ICA are consistent with a 1-39%  stenosis.   Vertebrals: Bilateral vertebral arteries demonstrate antegrade flow.  Subclavians: Normal flow hemodynamics were seen in bilateral subclavian        arteries.   Assessment and Plan:  1.  Permanent atrial fibrillation, heart rate control is adequate today on Lopressor and she continues on Eliquis for stroke prophylaxis.  No changes were made, check CBC and BMET for next visit.  2.  Anemia with hemoglobin 10.8 down from 12.4 previously.  She reports hematochezia, likely has hemorrhoids, better with stool softeners.  She has seen Dr. Laural Golden previously with colonoscopy in the past.  We will get her back for a follow-up visit.  3.  Asymptomatic, mild carotid artery disease by Dopplers in 2019.  We will obtain a fasting lipid profile, she is currently not on statin therapy.  Medication Adjustments/Labs and Tests Ordered: Current medicines are reviewed at length with the patient today.  Concerns regarding medicines are outlined above.   Tests Ordered: Orders Placed This Encounter  Procedures  . CBC  . Basic Metabolic Panel (BMET)  . Lipid Profile  . Ambulatory referral to Gastroenterology    Medication Changes: No orders of the defined types were placed in this  encounter.   Disposition:  Follow up 6 months in the [place] office.  Signed, Satira Sark, MD, [facility] 08/12/2019 10:10 AM    [place] at [facility] [address], [place], [place] [address] Phone: [phone]; Fax: [phone]

## 2019-08-12 ENCOUNTER — Encounter: Payer: Self-pay | Admitting: Cardiology

## 2019-08-12 ENCOUNTER — Ambulatory Visit: Payer: Medicare HMO | Admitting: Cardiology

## 2019-08-12 ENCOUNTER — Other Ambulatory Visit: Payer: Self-pay

## 2019-08-12 VITALS — BP 138/80 | HR 82 | Ht 66.0 in | Wt 180.4 lb

## 2019-08-12 DIAGNOSIS — D649 Anemia, unspecified: Secondary | ICD-10-CM | POA: Diagnosis not present

## 2019-08-12 DIAGNOSIS — Z79899 Other long term (current) drug therapy: Secondary | ICD-10-CM | POA: Diagnosis not present

## 2019-08-12 DIAGNOSIS — I6523 Occlusion and stenosis of bilateral carotid arteries: Secondary | ICD-10-CM

## 2019-08-12 DIAGNOSIS — I480 Paroxysmal atrial fibrillation: Secondary | ICD-10-CM

## 2019-08-12 NOTE — Patient Instructions (Signed)
Medication Instructions:  Your physician recommends that you continue on your current medications as directed. Please refer to the Current Medication list given to you today.  *If you need a refill on your cardiac medications before your next appointment, please call your pharmacy*   Lab Work: Fasting lipids now   CBC and BMET in 6 months just BEFORE next visit  If you have labs (blood work) drawn today and your tests are completely normal, you will receive your results only by: [name] MyChart Message (if you have MyChart) OR . A paper copy in the mail If you have any lab test that is abnormal or we need to change your treatment, we will call you to review the results.   Testing/Procedures: None today   Follow-Up: At [facility], you and your health needs are our priority.  As part of our continuing mission to provide you with exceptional heart care, we have created designated Provider Care Teams.  These Care Teams include your primary Cardiologist (physician) and Advanced Practice Providers (APPs -  Physician Assistants and Nurse Practitioners) who all work together to provide you with the care you need, when you need it.  We recommend signing up for the patient portal called "MyChart".  Sign up information is provided on this After Visit Summary.  MyChart is used to connect with patients for Virtual Visits (Telemedicine).  Patients are able to view lab/test results, encounter notes, upcoming appointments, etc.  Non-urgent messages can be sent to your provider as well.   To learn more about what you can do with MyChart, go to [email or web address].    Your next appointment:   6 month(s)  The format for your next appointment:   In Person  Provider:   Rozann Lesches, MD   Other Instructions We have referred you to [place]. His office will call you to schedule an apt.

## 2019-08-14 ENCOUNTER — Other Ambulatory Visit (HOSPITAL_COMMUNITY)
Admission: RE | Admit: 2019-08-14 | Discharge: 2019-08-14 | Disposition: A | Discharge status: Home / Self Care | Payer: Medicare HMO | Source: Ambulatory Visit | Attending: Cardiology | Admitting: Cardiology

## 2019-08-14 ENCOUNTER — Other Ambulatory Visit: Payer: Self-pay

## 2019-08-14 DIAGNOSIS — I480 Paroxysmal atrial fibrillation: Secondary | ICD-10-CM | POA: Insufficient documentation

## 2019-08-14 DIAGNOSIS — I6523 Occlusion and stenosis of bilateral carotid arteries: Secondary | ICD-10-CM | POA: Insufficient documentation

## 2019-08-14 LAB — BASIC METABOLIC PANEL
Anion gap: 10 (ref 5–15)
BUN: 16 mg/dL (ref 8–23)
CO2: 26 mmol/L (ref 22–32)
Calcium: 10.2 mg/dL (ref 8.9–10.3)
Chloride: 102 mmol/L (ref 98–111)
Creatinine, Ser: 0.93 mg/dL (ref 0.44–1.00)
GFR calc Af Amer: >60 mL/min (ref >60)
GFR calc non Af Amer: 58 mL/min — ABNORMAL LOW (ref >60)
Glucose, Bld: 108 mg/dL — ABNORMAL HIGH (ref 70–99)
Potassium: 4 mmol/L (ref 3.5–5.1)
Sodium: 138 mmol/L (ref 135–145)

## 2019-08-14 LAB — CBC
HCT: 36.2 % (ref 36.0–46.0)
Hemoglobin: 11.2 g/dL — ABNORMAL LOW (ref 12.0–15.0)
MCH: 26.4 pg (ref 26.0–34.0)
MCHC: 30.9 g/dL (ref 30.0–36.0)
MCV: 85.4 fL (ref 80.0–100.0)
Platelets: 227 10*3/uL (ref 150–400)
RBC: 4.24 MIL/uL (ref 3.87–5.11)
RDW: 14.2 % (ref 11.5–15.5)
WBC: 5.9 10*3/uL (ref 4.0–10.5)
nRBC: 0 % (ref 0.0–0.2)

## 2019-08-14 LAB — LIPID PANEL
Cholesterol: 127 mg/dL (ref 0–200)
HDL: 58 mg/dL (ref >40)
LDL Cholesterol: 58 mg/dL (ref 0–99)
Total CHOL/HDL Ratio: 2.2 RATIO
Triglycerides: 54 mg/dL (ref <150)
VLDL: 11 mg/dL (ref 0–40)

## 2019-09-05 DIAGNOSIS — H524 Presbyopia: Secondary | ICD-10-CM | POA: Diagnosis not present

## 2019-09-05 DIAGNOSIS — Z961 Presence of intraocular lens: Secondary | ICD-10-CM | POA: Diagnosis not present

## 2019-09-05 DIAGNOSIS — H353131 Nonexudative age-related macular degeneration, bilateral, early dry stage: Secondary | ICD-10-CM | POA: Diagnosis not present

## 2019-09-05 DIAGNOSIS — H52203 Unspecified astigmatism, bilateral: Secondary | ICD-10-CM | POA: Diagnosis not present

## 2019-10-14 IMAGING — DX DG HIP (WITH OR WITHOUT PELVIS) 2-3V*R*
3 series · 3 of 3 positions shown · non-contrast
Comparison: None.

CLINICAL DATA: Initial evaluation for acute trauma, fall.

EXAM:
DG HIP (WITH OR WITHOUT PELVIS) 2-3V RIGHT

[pelvis ap]
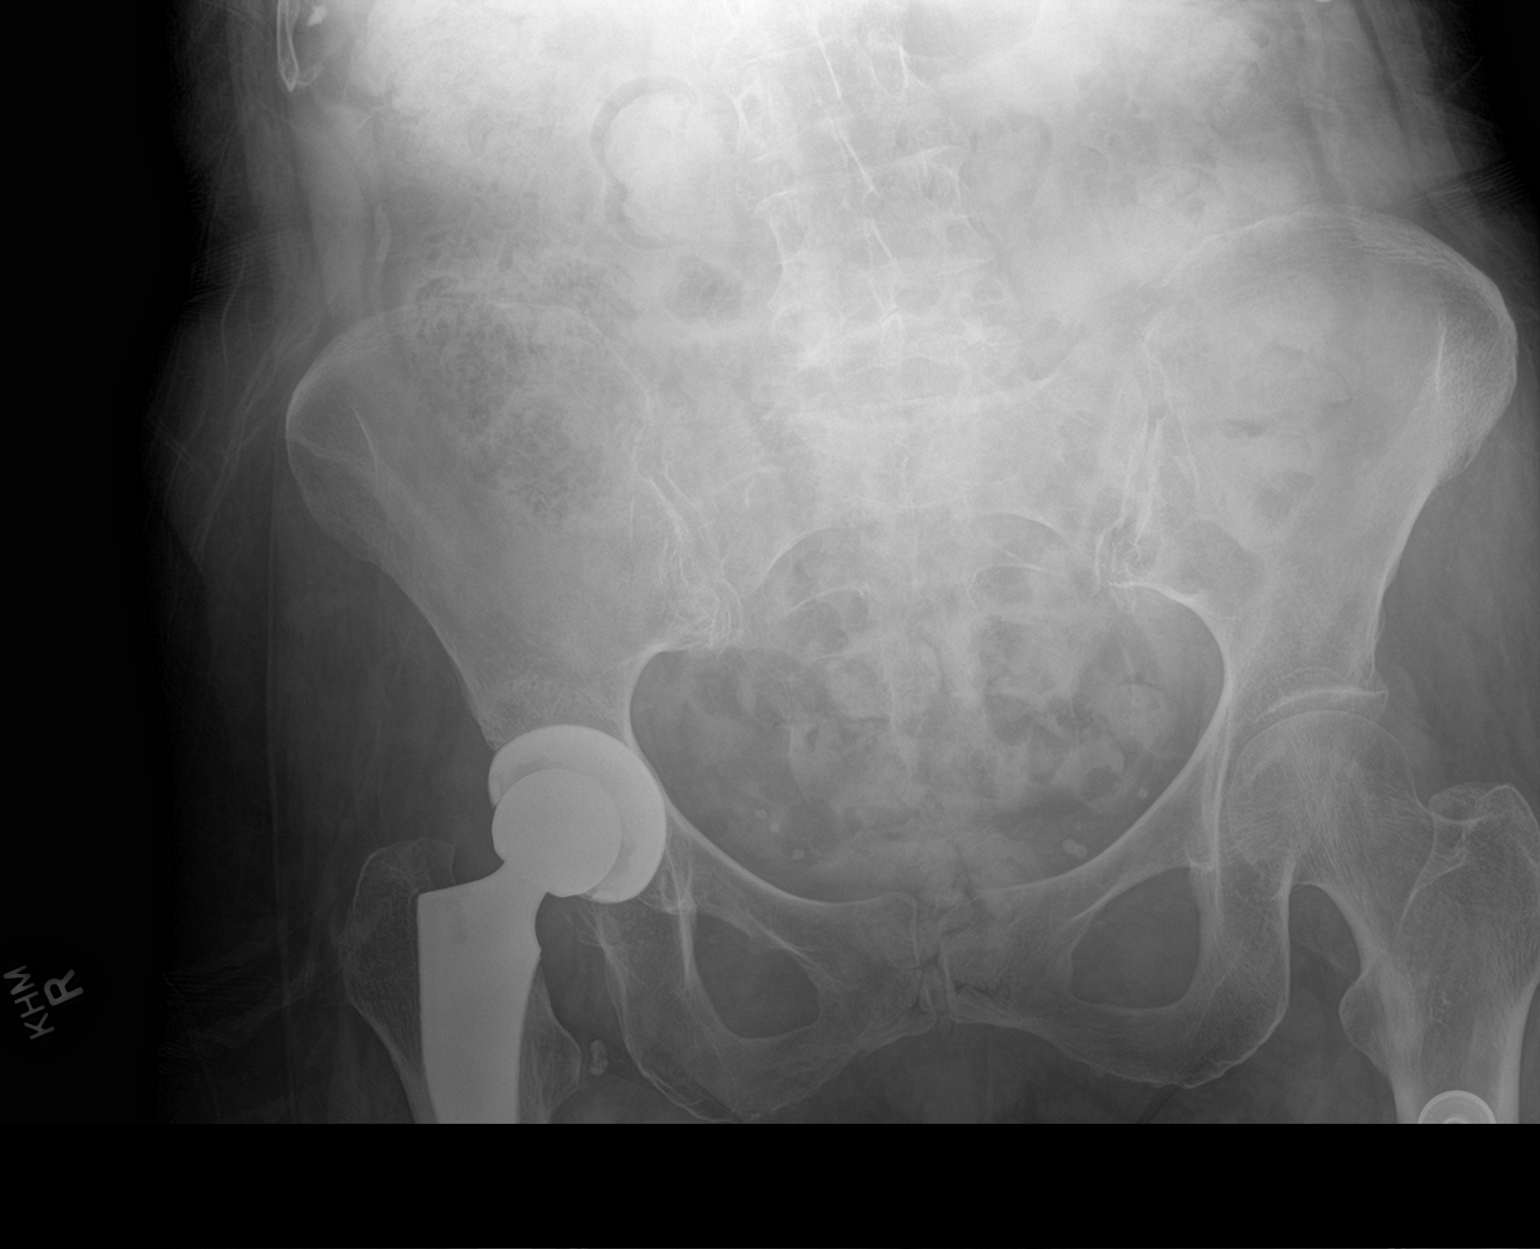

[hip ap]
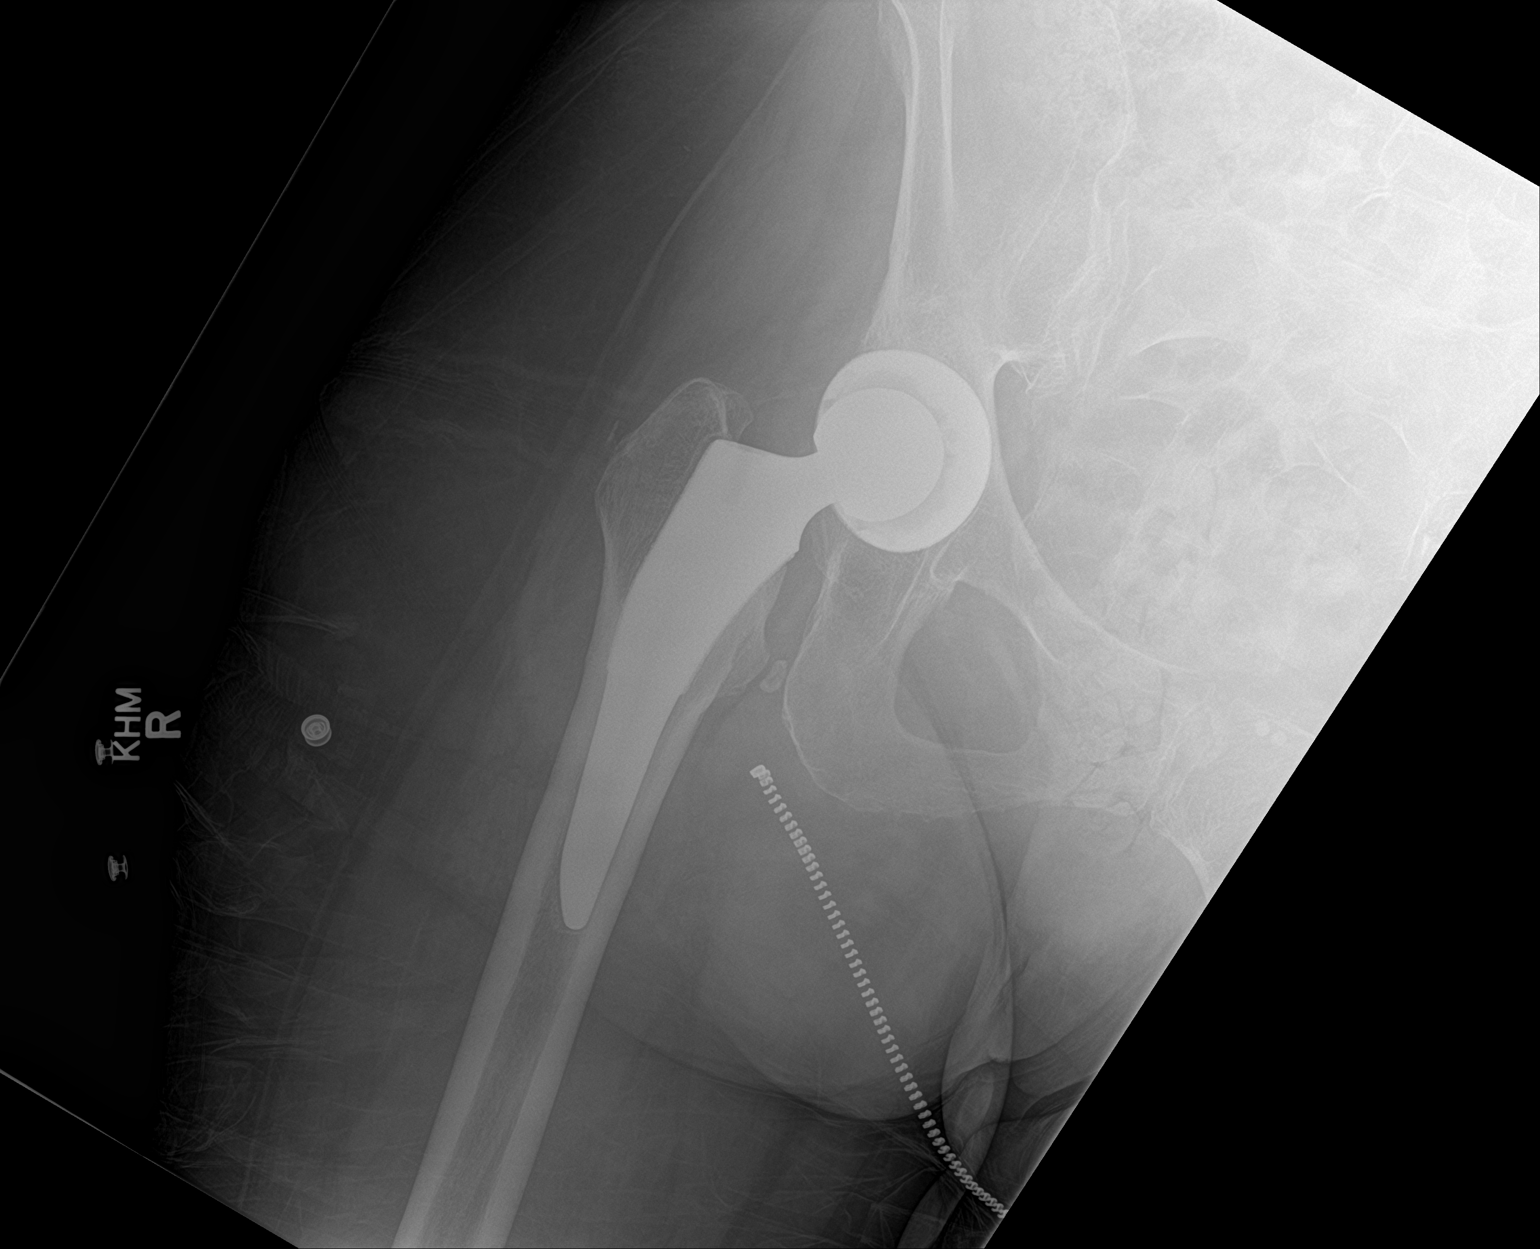

[hip lat]
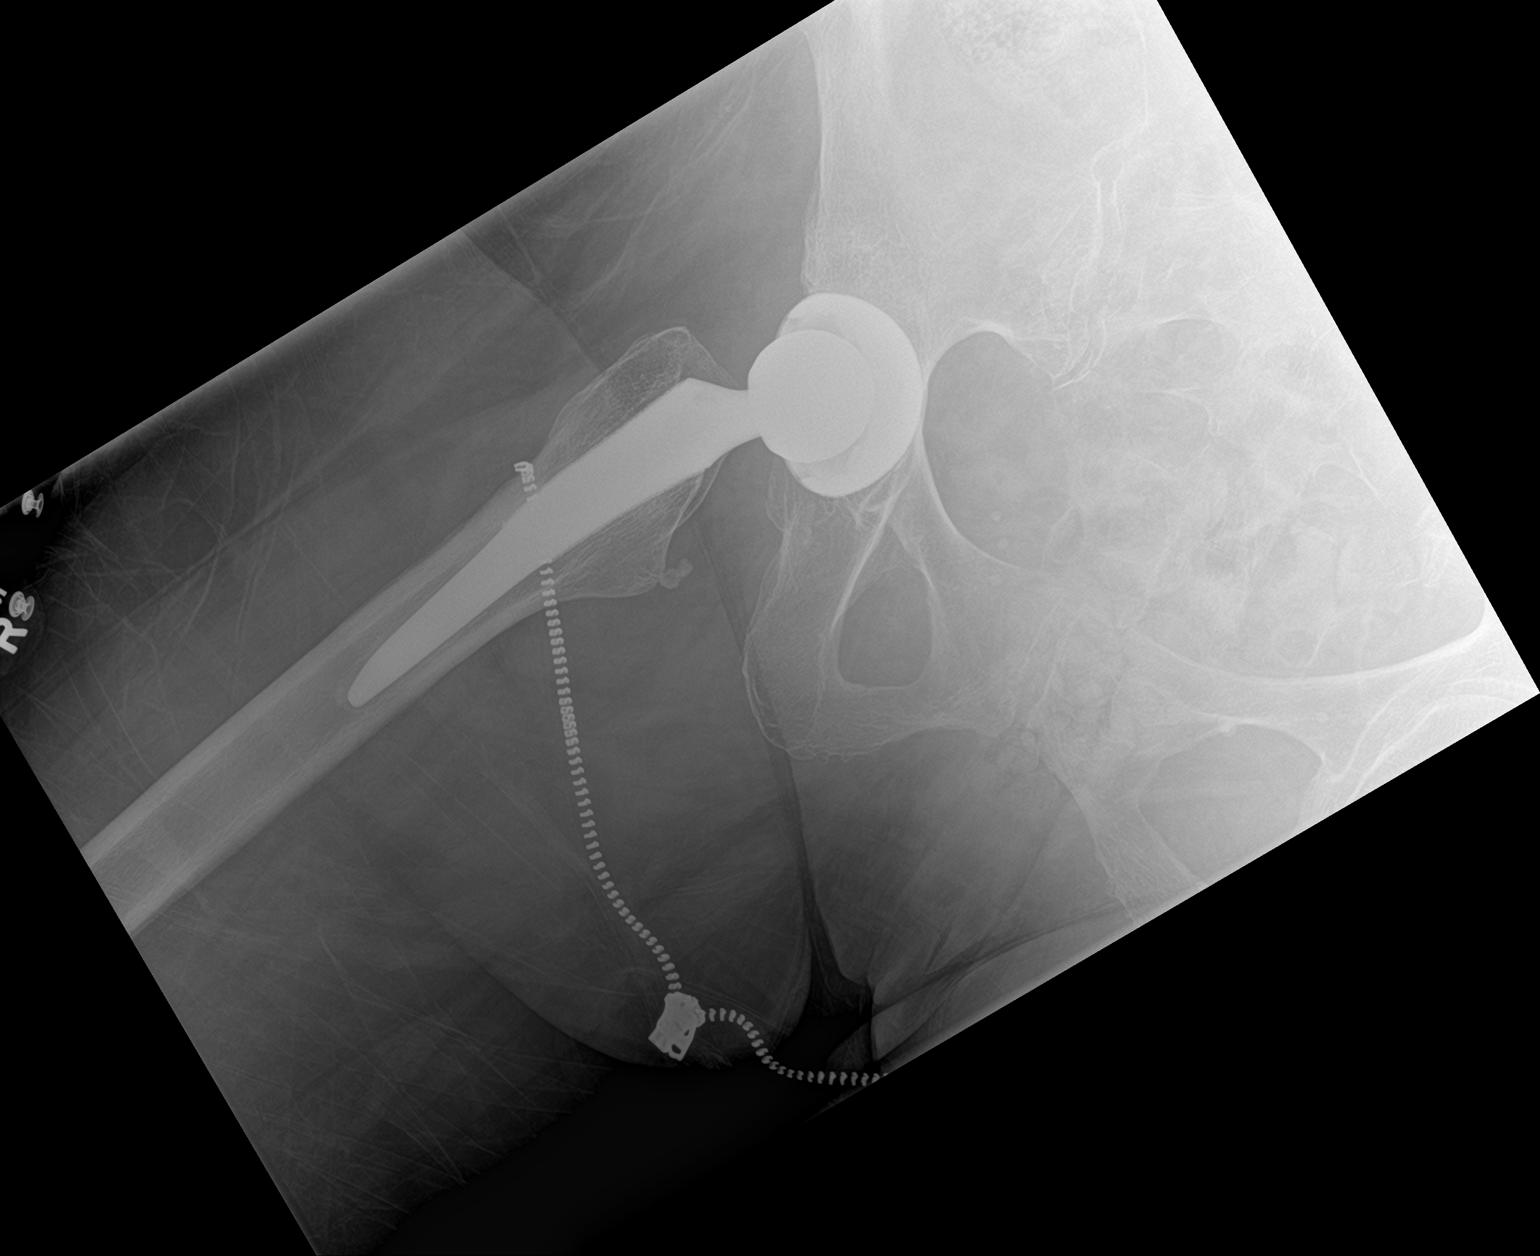

[3 of 3 positions shown; findings below may reference images not displayed]

FINDINGS: Right total hip arthroplasty in place. No periprosthetic lucency to
suggest loosening or other complication. No acute periprosthetic
fracture. Bony pelvis intact. No pubic diastasis. SI joints
approximated. Limited views of the left hip demonstrate no acute
finding.

Prominent degenerative changes noted within the lower lumbar spine.

No acute soft tissue abnormality.
IMPRESSION: 1. No acute osseous abnormality about the right hip.
2. Right total hip arthroplasty in place without complication.

## 2019-10-15 DIAGNOSIS — J45909 Unspecified asthma, uncomplicated: Secondary | ICD-10-CM | POA: Diagnosis not present

## 2019-10-15 DIAGNOSIS — I480 Paroxysmal atrial fibrillation: Secondary | ICD-10-CM | POA: Diagnosis not present

## 2019-10-15 DIAGNOSIS — M1611 Unilateral primary osteoarthritis, right hip: Secondary | ICD-10-CM | POA: Diagnosis not present

## 2019-10-15 DIAGNOSIS — I10 Essential (primary) hypertension: Secondary | ICD-10-CM | POA: Diagnosis not present

## 2019-10-17 ENCOUNTER — Other Ambulatory Visit (HOSPITAL_COMMUNITY): Payer: Self-pay | Admitting: Family Medicine

## 2019-10-17 DIAGNOSIS — Z1231 Encounter for screening mammogram for malignant neoplasm of breast: Secondary | ICD-10-CM

## 2019-10-24 DIAGNOSIS — I480 Paroxysmal atrial fibrillation: Secondary | ICD-10-CM | POA: Diagnosis not present

## 2019-10-24 DIAGNOSIS — Z6831 Body mass index (BMI) 31.0-31.9, adult: Secondary | ICD-10-CM | POA: Diagnosis not present

## 2019-10-24 DIAGNOSIS — Z0001 Encounter for general adult medical examination with abnormal findings: Secondary | ICD-10-CM | POA: Diagnosis not present

## 2019-10-24 DIAGNOSIS — I10 Essential (primary) hypertension: Secondary | ICD-10-CM | POA: Diagnosis not present

## 2019-10-24 DIAGNOSIS — J45909 Unspecified asthma, uncomplicated: Secondary | ICD-10-CM | POA: Diagnosis not present

## 2019-10-24 DIAGNOSIS — R7309 Other abnormal glucose: Secondary | ICD-10-CM | POA: Diagnosis not present

## 2019-10-24 DIAGNOSIS — Z1389 Encounter for screening for other disorder: Secondary | ICD-10-CM | POA: Diagnosis not present

## 2019-11-18 ENCOUNTER — Ambulatory Visit (HOSPITAL_COMMUNITY)
Admission: RE | Admit: 2019-11-18 | Discharge: 2019-11-18 | Disposition: A | Discharge status: Home / Self Care | Payer: Medicare HMO | Source: Ambulatory Visit | Attending: Family Medicine | Admitting: Family Medicine

## 2019-11-18 ENCOUNTER — Other Ambulatory Visit: Payer: Self-pay

## 2019-11-18 DIAGNOSIS — Z1231 Encounter for screening mammogram for malignant neoplasm of breast: Secondary | ICD-10-CM | POA: Diagnosis not present

## 2019-12-06 DIAGNOSIS — J209 Acute bronchitis, unspecified: Secondary | ICD-10-CM | POA: Diagnosis not present

## 2019-12-14 DIAGNOSIS — M810 Age-related osteoporosis without current pathological fracture: Secondary | ICD-10-CM | POA: Diagnosis not present

## 2019-12-14 DIAGNOSIS — E6609 Other obesity due to excess calories: Secondary | ICD-10-CM | POA: Diagnosis not present

## 2019-12-14 DIAGNOSIS — I10 Essential (primary) hypertension: Secondary | ICD-10-CM | POA: Diagnosis not present

## 2019-12-30 ENCOUNTER — Other Ambulatory Visit: Payer: Self-pay | Admitting: Cardiovascular Disease

## 2019-12-30 NOTE — Telephone Encounter (Signed)
Rx has been sent to the pharmacy electronically. ° °

## 2020-01-07 DIAGNOSIS — R69 Illness, unspecified: Secondary | ICD-10-CM | POA: Diagnosis not present

## 2020-01-14 DIAGNOSIS — I10 Essential (primary) hypertension: Secondary | ICD-10-CM | POA: Diagnosis not present

## 2020-01-14 DIAGNOSIS — E6609 Other obesity due to excess calories: Secondary | ICD-10-CM | POA: Diagnosis not present

## 2020-01-14 DIAGNOSIS — J069 Acute upper respiratory infection, unspecified: Secondary | ICD-10-CM | POA: Diagnosis not present

## 2020-01-14 DIAGNOSIS — M810 Age-related osteoporosis without current pathological fracture: Secondary | ICD-10-CM | POA: Diagnosis not present

## 2020-01-24 ENCOUNTER — Other Ambulatory Visit (HOSPITAL_COMMUNITY)
Admission: RE | Admit: 2020-01-24 | Discharge: 2020-01-24 | Disposition: A | Discharge status: Home / Self Care | Payer: Medicare HMO | Source: Ambulatory Visit | Attending: Cardiology | Admitting: Cardiology

## 2020-01-24 ENCOUNTER — Other Ambulatory Visit: Payer: Self-pay

## 2020-01-24 DIAGNOSIS — Z79899 Other long term (current) drug therapy: Secondary | ICD-10-CM | POA: Diagnosis not present

## 2020-01-24 LAB — CBC
HCT: 35.4 % — ABNORMAL LOW (ref 36.0–46.0)
Hemoglobin: 11.1 g/dL — ABNORMAL LOW (ref 12.0–15.0)
MCH: 26.9 pg (ref 26.0–34.0)
MCHC: 31.4 g/dL (ref 30.0–36.0)
MCV: 85.9 fL (ref 80.0–100.0)
Platelets: 215 10*3/uL (ref 150–400)
RBC: 4.12 MIL/uL (ref 3.87–5.11)
RDW: 14.3 % (ref 11.5–15.5)
WBC: 7.4 10*3/uL (ref 4.0–10.5)
nRBC: 0 % (ref 0.0–0.2)

## 2020-01-24 LAB — BASIC METABOLIC PANEL
Anion gap: 9 (ref 5–15)
BUN: 19 mg/dL (ref 8–23)
CO2: 26 mmol/L (ref 22–32)
Calcium: 10.2 mg/dL (ref 8.9–10.3)
Chloride: 102 mmol/L (ref 98–111)
Creatinine, Ser: 0.98 mg/dL (ref 0.44–1.00)
GFR, Estimated: 58 mL/min — ABNORMAL LOW (ref >60)
Glucose, Bld: 104 mg/dL — ABNORMAL HIGH (ref 70–99)
Potassium: 3.9 mmol/L (ref 3.5–5.1)
Sodium: 137 mmol/L (ref 135–145)

## 2020-02-04 ENCOUNTER — Other Ambulatory Visit: Payer: Self-pay

## 2020-02-04 ENCOUNTER — Encounter: Payer: Self-pay | Admitting: Cardiology

## 2020-02-04 ENCOUNTER — Ambulatory Visit: Payer: Medicare HMO | Admitting: Cardiology

## 2020-02-04 VITALS — BP 122/78 | HR 76 | Ht 66.0 in | Wt 178.0 lb

## 2020-02-04 DIAGNOSIS — I6523 Occlusion and stenosis of bilateral carotid arteries: Secondary | ICD-10-CM

## 2020-02-04 DIAGNOSIS — I4821 Permanent atrial fibrillation: Secondary | ICD-10-CM

## 2020-02-04 NOTE — Progress Notes (Signed)
Cardiology Office Note  Date: 02/04/2020   ID: Bailey, Wilson 11/09/1940, MRN [id number]  PCP:  Sharilyn Sites, MD  Cardiologist:  Rozann Lesches, MD Electrophysiologist:  None   Chief Complaint  Patient presents with  . Cardiac follow-up    History of Present Illness: Bailey KLIEBERT is an 79 y.o. female last seen in June. She presents for a routine follow-up visit. Reports no palpitations or chest pain with typical activities. States that she has been compliant with her medications as listed below.  I reviewed her recent follow-up lab work as outlined below. Hemoglobin has increased, she still remains mildly anemic. Reports intermittent hematochezia related to hemorrhoids, no increasing events however.  I personally reviewed her ECG today which shows rate controlled atrial fibrillation with poor R wave progression anteriorly. She is on Lopressor 25 mg twice daily.  Past Medical History:  Diagnosis Date  . Anemia   . Carotid stenosis 06/03/2016   1-39% bilateral ICA stenosis 05/2016  . Essential hypertension   . GERD (gastroesophageal reflux disease)   . History of pneumonia   . Osteoarthritis    Right knee  . Paroxysmal atrial fibrillation (HCC)   . Seasonal allergies     Past Surgical History:  Procedure Laterality Date  . ABDOMINAL HYSTERECTOMY  1985  . CATARACT EXTRACTION W/ INTRAOCULAR LENS  IMPLANT, BILATERAL Bilateral 2002-2007   "left-right"  . COLONOSCOPY    . [place]   "prior to hysterectomy"  . FRACTURE SURGERY    . JOINT REPLACEMENT    . NASAL FRACTURE SURGERY  2016  . REPLACEMENT TOTAL KNEE Right   . TONSILLECTOMY    . 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 TOTAL HIP ARTHROPLASTY Right 07/04/2016   Procedure: TOTAL HIP ARTHROPLASTY ANTERIOR APPROACH;  Surgeon: Frederik Pear, MD;  Location: [place];  Service: Orthopedics;  Laterality: Right;  . TOTAL KNEE ARTHROPLASTY Right 05/10/2017   Procedure: RIGHT TOTAL KNEE  ARTHROPLASTY;  Surgeon: Frederik Pear, MD;  Location: [place];  Service: Orthopedics;  Laterality: Right;    Current Outpatient Medications  Medication Sig Dispense Refill  . albuterol (PROVENTIL HFA;VENTOLIN HFA) 108 (90 Base) MCG/ACT inhaler Inhale 2 puffs into the lungs every 6 (six) hours as needed for wheezing or shortness of breath.    [name] amLODipine (NORVASC) 5 MG tablet Take 1 tablet (5 mg total) by mouth every morning. 90 tablet 3  . apixaban (ELIQUIS) 5 MG TABS tablet Take 1 tablet (5 mg total) by mouth 2 (two) times daily. 60 tablet 11  . ARTIFICIAL TEAR OP Apply 2 drops to eye every morning.     . AZO-CRANBERRY PO Take 2 tablets by mouth every morning.     . Calcium 600-200 MG-UNIT tablet Take 1 tablet by mouth daily.    . calcium carbonate (TUMS - DOSED IN MG ELEMENTAL CALCIUM) 500 MG chewable tablet Chew 1 tablet by mouth daily as needed for indigestion or heartburn.    . cetirizine (ZYRTEC) 10 MG tablet Take 10 mg by mouth every morning.     . docusate sodium (COLACE) 100 MG capsule Take 100 mg by mouth daily as needed for mild constipation.     . hydrochlorothiazide (HYDRODIURIL) 25 MG tablet Take 25 mg by mouth every morning.     . metoprolol tartrate (LOPRESSOR) 25 MG tablet TAKE 1 TABLET BY MOUTH TWICE A DAY 180 tablet 1  . Multiple Vitamins-Minerals (ICAPS MV PO) Take 1 capsule by  mouth every morning.     . sodium chloride (OCEAN) 0.65 % SOLN nasal spray Place 1 spray into both nostrils as needed for congestion.     No current facility-administered medications for this visit.   Allergies:  Penicillins, Ace inhibitors, Aspirin, and Sulfa antibiotics   ROS: Interval episode of bronchitis, resolved.  Physical Exam: VS:  BP 122/78   Pulse 76   Ht 5\' 6"  (1.676 m)   Wt 178 lb (80.7 kg)   SpO2 99%   BMI 28.73 kg/m , BMI Body mass index is 28.73 kg/m.  Wt Readings from Last 3 Encounters:  02/04/20 178 lb (80.7 kg)  08/12/19 180 lb 6.4 oz (81.8 kg)  04/15/19 178 lb (80.7  kg)    General: Elderly woman, appears comfortable at rest. HEENT: Conjunctiva and lids normal, wearing a mask. Neck: Supple, no elevated JVP or carotid bruits, no thyromegaly. Lungs: Clear to auscultation, nonlabored breathing at rest. Cardiac: Irregularly irregular, no S3, soft systolic murmur, no pericardial rub. Extremities: No pitting edema.  ECG:  An ECG dated 02/28/2019 was personally reviewed today and demonstrated:  Atrial fibrillation with poor R wave progression and diffuse nonspecific ST changes.  Recent Labwork: 02/28/2019: ALT 11; AST 17; Magnesium 1.6; TSH 1.370 01/24/2020: BUN 19; Creatinine, Ser 0.98; Hemoglobin 11.1; Platelets 215; Potassium 3.9; Sodium 137     Component Value Date/Time   CHOL 127 08/14/2019 0823   TRIG 54 08/14/2019 0823   HDL 58 08/14/2019 0823   CHOLHDL 2.2 08/14/2019 0823   VLDL 11 08/14/2019 0823   LDLCALC 58 08/14/2019 0823    Other Studies Reviewed Today:  Echocardiogram 03/12/2019: 1. Left ventricular ejection fraction, by visual estimation, is 55 to  60%. The left ventricle has normal function. There is mildly increased  left ventricular hypertrophy.  2. Left ventricular diastolic parameters are indeterminate.  3. The left ventricle has no regional wall motion abnormalities.  4. Global right ventricle has normal systolic function.The right  ventricular size is normal. No increase in right ventricular wall  thickness.  5. Left atrial size was moderately dilated.  6. Right atrial size was mildly dilated.  7. Moderate calcification of the mitral valve leaflet(s).  8. Moderate mitral annular calcification.  9. Moderate thickening of the mitral valve leaflet(s).  10. The mitral valve is normal in structure. Mild mitral valve  regurgitation. No evidence of mitral stenosis.  11. The tricuspid valve is normal in structure.  12. The tricuspid valve is normal in structure. Tricuspid valve  regurgitation is moderate.  13. The aortic  valve is tricuspid. Aortic valve regurgitation is mild.  Mild aortic valve sclerosis without stenosis.  14. Pulmonic regurgitation is moderate.  15. The pulmonic valve was normal in structure. Pulmonic valve  regurgitation is moderate.  16. Aortic dilatation noted.  17. There is mild dilatation of the aortic root measuring 39 mm.  18. Moderately elevated pulmonary artery systolic pressure.  19. The inferior vena cava is normal in size with greater than 50%  respiratory variability, suggesting right atrial pressure of 3 mmHg.   Assessment and Plan:  1. Permanent atrial fibrillation. CHA2DS2-VASc score is 5. She is asymptomatic in terms of palpitations and remains on Lopressor along with Eliquis. Recent lab work reviewed. No changes were made.  2. Asymptomatic carotid artery disease, mild by Dopplers in 2019. She has declined statin therapy.  Medication Adjustments/Labs and Tests Ordered: Current medicines are reviewed at length with the patient today.  Concerns regarding medicines are outlined above.  Tests Ordered: Orders Placed This Encounter  Procedures  . CBC  . Basic Metabolic Panel (BMET)  . EKG 12-Lead    Medication Changes: No orders of the defined types were placed in this encounter.   Disposition:  Follow up 6 months in the [place] office.  Signed, Satira Sark, MD, [facility] 02/04/2020 1:20 PM    [facility] at [facility] [address] [place], [place] [address] Phone: [phone]; Fax: [phone]

## 2020-02-04 NOTE — Patient Instructions (Signed)
Medication Instructions:  Your physician recommends that you continue on your current medications as directed. Please refer to the Current Medication list given to you today.  *If you need a refill on your cardiac medications before your next appointment, please call your pharmacy*   Lab Work: CBC,BMET in 6 months BEFORE next visit  If you have labs (blood work) drawn today and your tests are completely normal, you will receive your results only by: [name] MyChart Message (if you have MyChart) OR . A paper copy in the mail If you have any lab test that is abnormal or we need to change your treatment, we will call you to review the results.   Testing/Procedures: None today   Follow-Up: At [facility], you and your health needs are our priority.  As part of our continuing mission to provide you with exceptional heart care, we have created designated Provider Care Teams.  These Care Teams include your primary Cardiologist (physician) and Advanced Practice Providers (APPs -  Physician Assistants and Nurse Practitioners) who all work together to provide you with the care you need, when you need it.  We recommend signing up for the patient portal called "MyChart".  Sign up information is provided on this After Visit Summary.  MyChart is used to connect with patients for Virtual Visits (Telemedicine).  Patients are able to view lab/test results, encounter notes, upcoming appointments, etc.  Non-urgent messages can be sent to your provider as well.   To learn more about what you can do with MyChart, go to [email or web address].    Your next appointment:   6 month(s)  The format for your next appointment:   In Person  Provider:   Rozann Lesches, MD   Other Instructions None    Thank you for choosing [facility] !

## 2020-02-14 DIAGNOSIS — E6609 Other obesity due to excess calories: Secondary | ICD-10-CM | POA: Diagnosis not present

## 2020-02-14 DIAGNOSIS — M810 Age-related osteoporosis without current pathological fracture: Secondary | ICD-10-CM | POA: Diagnosis not present

## 2020-02-14 DIAGNOSIS — I10 Essential (primary) hypertension: Secondary | ICD-10-CM | POA: Diagnosis not present

## 2020-03-14 DIAGNOSIS — E6609 Other obesity due to excess calories: Secondary | ICD-10-CM | POA: Diagnosis not present

## 2020-03-14 DIAGNOSIS — I10 Essential (primary) hypertension: Secondary | ICD-10-CM | POA: Diagnosis not present

## 2020-03-14 DIAGNOSIS — M810 Age-related osteoporosis without current pathological fracture: Secondary | ICD-10-CM | POA: Diagnosis not present

## 2020-03-19 ENCOUNTER — Other Ambulatory Visit (HOSPITAL_COMMUNITY): Payer: Self-pay | Admitting: Family Medicine

## 2020-03-19 ENCOUNTER — Other Ambulatory Visit: Payer: Self-pay

## 2020-03-19 ENCOUNTER — Ambulatory Visit (HOSPITAL_COMMUNITY)
Admission: RE | Admit: 2020-03-19 | Discharge: 2020-03-19 | Disposition: A | Discharge status: Home / Self Care | Payer: Medicare HMO | Source: Ambulatory Visit | Attending: Family Medicine | Admitting: Family Medicine

## 2020-03-19 DIAGNOSIS — M5136 Other intervertebral disc degeneration, lumbar region: Secondary | ICD-10-CM | POA: Diagnosis not present

## 2020-03-19 DIAGNOSIS — S22080A Wedge compression fracture of T11-T12 vertebra, initial encounter for closed fracture: Secondary | ICD-10-CM | POA: Diagnosis not present

## 2020-03-19 DIAGNOSIS — M546 Pain in thoracic spine: Secondary | ICD-10-CM | POA: Diagnosis not present

## 2020-03-19 DIAGNOSIS — Z6830 Body mass index (BMI) 30.0-30.9, adult: Secondary | ICD-10-CM | POA: Diagnosis not present

## 2020-03-19 DIAGNOSIS — S3992XA Unspecified injury of lower back, initial encounter: Secondary | ICD-10-CM | POA: Diagnosis not present

## 2020-03-19 DIAGNOSIS — M545 Low back pain, unspecified: Secondary | ICD-10-CM

## 2020-03-19 DIAGNOSIS — Z1331 Encounter for screening for depression: Secondary | ICD-10-CM | POA: Diagnosis not present

## 2020-03-19 DIAGNOSIS — W19XXXA Unspecified fall, initial encounter: Secondary | ICD-10-CM | POA: Diagnosis not present

## 2020-03-25 DIAGNOSIS — M8580 Other specified disorders of bone density and structure, unspecified site: Secondary | ICD-10-CM | POA: Diagnosis not present

## 2020-03-25 DIAGNOSIS — I10 Essential (primary) hypertension: Secondary | ICD-10-CM | POA: Diagnosis not present

## 2020-03-25 DIAGNOSIS — Z6827 Body mass index (BMI) 27.0-27.9, adult: Secondary | ICD-10-CM | POA: Diagnosis not present

## 2020-03-25 DIAGNOSIS — S22080A Wedge compression fracture of T11-T12 vertebra, initial encounter for closed fracture: Secondary | ICD-10-CM | POA: Diagnosis not present

## 2020-03-26 ENCOUNTER — Other Ambulatory Visit: Payer: Self-pay | Admitting: Physician Assistant

## 2020-04-02 ENCOUNTER — Other Ambulatory Visit: Payer: Self-pay | Admitting: Cardiovascular Disease

## 2020-04-13 DIAGNOSIS — I10 Essential (primary) hypertension: Secondary | ICD-10-CM | POA: Diagnosis not present

## 2020-04-13 DIAGNOSIS — M810 Age-related osteoporosis without current pathological fracture: Secondary | ICD-10-CM | POA: Diagnosis not present

## 2020-04-13 DIAGNOSIS — E6609 Other obesity due to excess calories: Secondary | ICD-10-CM | POA: Diagnosis not present

## 2020-06-13 DIAGNOSIS — I10 Essential (primary) hypertension: Secondary | ICD-10-CM | POA: Diagnosis not present

## 2020-06-13 DIAGNOSIS — E6609 Other obesity due to excess calories: Secondary | ICD-10-CM | POA: Diagnosis not present

## 2020-06-13 DIAGNOSIS — M810 Age-related osteoporosis without current pathological fracture: Secondary | ICD-10-CM | POA: Diagnosis not present

## 2020-06-30 ENCOUNTER — Other Ambulatory Visit: Payer: Self-pay | Admitting: Cardiovascular Disease

## 2020-07-14 DIAGNOSIS — I10 Essential (primary) hypertension: Secondary | ICD-10-CM | POA: Diagnosis not present

## 2020-07-14 DIAGNOSIS — M810 Age-related osteoporosis without current pathological fracture: Secondary | ICD-10-CM | POA: Diagnosis not present

## 2020-08-26 DIAGNOSIS — D0462 Carcinoma in situ of skin of left upper limb, including shoulder: Secondary | ICD-10-CM | POA: Diagnosis not present

## 2020-08-26 DIAGNOSIS — L570 Actinic keratosis: Secondary | ICD-10-CM | POA: Diagnosis not present

## 2020-08-26 DIAGNOSIS — C44629 Squamous cell carcinoma of skin of left upper limb, including shoulder: Secondary | ICD-10-CM | POA: Diagnosis not present

## 2020-08-26 DIAGNOSIS — X32XXXD Exposure to sunlight, subsequent encounter: Secondary | ICD-10-CM | POA: Diagnosis not present

## 2020-09-07 DIAGNOSIS — H353131 Nonexudative age-related macular degeneration, bilateral, early dry stage: Secondary | ICD-10-CM | POA: Diagnosis not present

## 2020-09-07 DIAGNOSIS — H52203 Unspecified astigmatism, bilateral: Secondary | ICD-10-CM | POA: Diagnosis not present

## 2020-09-07 DIAGNOSIS — H524 Presbyopia: Secondary | ICD-10-CM | POA: Diagnosis not present

## 2020-09-07 DIAGNOSIS — Z961 Presence of intraocular lens: Secondary | ICD-10-CM | POA: Diagnosis not present

## 2020-09-07 DIAGNOSIS — H5213 Myopia, bilateral: Secondary | ICD-10-CM | POA: Diagnosis not present

## 2020-09-13 DIAGNOSIS — M810 Age-related osteoporosis without current pathological fracture: Secondary | ICD-10-CM | POA: Diagnosis not present

## 2020-09-13 DIAGNOSIS — I10 Essential (primary) hypertension: Secondary | ICD-10-CM | POA: Diagnosis not present

## 2020-10-07 DIAGNOSIS — L570 Actinic keratosis: Secondary | ICD-10-CM | POA: Diagnosis not present

## 2020-10-07 DIAGNOSIS — X32XXXD Exposure to sunlight, subsequent encounter: Secondary | ICD-10-CM | POA: Diagnosis not present

## 2020-10-07 DIAGNOSIS — Z08 Encounter for follow-up examination after completed treatment for malignant neoplasm: Secondary | ICD-10-CM | POA: Diagnosis not present

## 2020-10-07 DIAGNOSIS — Z85828 Personal history of other malignant neoplasm of skin: Secondary | ICD-10-CM | POA: Diagnosis not present

## 2020-10-14 DIAGNOSIS — M810 Age-related osteoporosis without current pathological fracture: Secondary | ICD-10-CM | POA: Diagnosis not present

## 2020-10-14 DIAGNOSIS — I10 Essential (primary) hypertension: Secondary | ICD-10-CM | POA: Diagnosis not present

## 2020-10-18 ENCOUNTER — Other Ambulatory Visit: Payer: Self-pay | Admitting: Cardiovascular Disease

## 2020-10-20 NOTE — Telephone Encounter (Signed)
Prescription refill request for Eliquis received. Last office visit:[place] 02/04/20 Scr:0.98 01/24/20 Age: 51fWeight: 80.7kg

## 2020-11-09 ENCOUNTER — Other Ambulatory Visit (HOSPITAL_COMMUNITY): Payer: Self-pay | Admitting: Family Medicine

## 2020-11-09 DIAGNOSIS — Z1231 Encounter for screening mammogram for malignant neoplasm of breast: Secondary | ICD-10-CM

## 2020-11-12 DIAGNOSIS — Z96651 Presence of right artificial knee joint: Secondary | ICD-10-CM | POA: Diagnosis not present

## 2020-11-12 DIAGNOSIS — Z471 Aftercare following joint replacement surgery: Secondary | ICD-10-CM | POA: Diagnosis not present

## 2020-11-12 DIAGNOSIS — Z96641 Presence of right artificial hip joint: Secondary | ICD-10-CM | POA: Diagnosis not present

## 2020-11-20 ENCOUNTER — Other Ambulatory Visit: Payer: Self-pay

## 2020-11-20 ENCOUNTER — Ambulatory Visit (HOSPITAL_COMMUNITY)
Admission: RE | Admit: 2020-11-20 | Discharge: 2020-11-20 | Disposition: A | Discharge status: Home / Self Care | Payer: Medicare HMO | Source: Ambulatory Visit | Attending: Family Medicine | Admitting: Family Medicine

## 2020-11-20 DIAGNOSIS — Z1231 Encounter for screening mammogram for malignant neoplasm of breast: Secondary | ICD-10-CM | POA: Insufficient documentation

## 2020-12-03 NOTE — Progress Notes (Signed)
Cardiology Office Note  Date: 12/04/2020   ID: Zarin, Hagmann October 05, 1940, MRN [id number]  PCP:  Sharilyn Sites, MD  Cardiologist:  Rozann Lesches, MD Electrophysiologist:  None   Chief Complaint  Patient presents with   Cardiac follow-up    History of Present Illness: Bailey Wilson is an 80 y.o. female last seen in December 2021.  She is here for a follow-up visit.  Reports no significant palpitations or chest pain.  She spent a month living with her daughter in [place] over the summer.  I reviewed her medications which are outlined below.  She does not report any spontaneous bleeding problems on Eliquis, no palpitations on Lopressor.  She is due for follow-up lab work.  Past Medical History:  Diagnosis Date   Anemia    Carotid stenosis 06/03/2016   1-39% bilateral ICA stenosis 05/2016   Essential hypertension    GERD (gastroesophageal reflux disease)    History of pneumonia    Osteoarthritis    Right knee   Paroxysmal atrial fibrillation (HCC)    Seasonal allergies     Past Surgical History:  Procedure Laterality Date   ABDOMINAL HYSTERECTOMY  1985   CATARACT EXTRACTION W/ INTRAOCULAR LENS  IMPLANT, BILATERAL Bilateral 2002-2007   "left-right"   COLONOSCOPY     [place]   "prior to hysterectomy"   FRACTURE SURGERY     JOINT REPLACEMENT     NASAL FRACTURE SURGERY  2016   REPLACEMENT TOTAL KNEE Right    TONSILLECTOMY     TOTAL HIP ARTHROPLASTY Right 07/04/2016   TOTAL HIP ARTHROPLASTY Right 07/04/2016   Procedure: TOTAL HIP ARTHROPLASTY ANTERIOR APPROACH;  Surgeon: Frederik Pear, MD;  Location: [facility];  Service: Orthopedics;  Laterality: Right;   TOTAL KNEE ARTHROPLASTY Right 05/10/2017   Procedure: RIGHT TOTAL KNEE ARTHROPLASTY;  Surgeon: Frederik Pear, MD;  Location: [facility];  Service: Orthopedics;  Laterality: Right;    Current Outpatient Medications  Medication Sig Dispense Refill   albuterol (PROVENTIL HFA;VENTOLIN HFA) 108  (90 Base) MCG/ACT inhaler Inhale 2 puffs into the lungs every 6 (six) hours as needed for wheezing or shortness of breath.     amLODipine (NORVASC) 5 MG tablet TAKE 1 TABLET BY MOUTH EVERY DAY IN THE MORNING 90 tablet 3   apixaban (ELIQUIS) 5 MG TABS tablet TAKE 1 TABLET BY MOUTH TWICE A DAY 180 tablet 1   ARTIFICIAL TEAR OP Apply 2 drops to eye every morning.      AZO-CRANBERRY PO Take 2 tablets by mouth every morning.      Calcium 600-200 MG-UNIT tablet Take 1 tablet by mouth daily.     calcium carbonate (TUMS - DOSED IN MG ELEMENTAL CALCIUM) 500 MG chewable tablet Chew 1 tablet by mouth daily as needed for indigestion or heartburn.     cetirizine (ZYRTEC) 10 MG tablet Take 10 mg by mouth every morning.      docusate sodium (COLACE) 100 MG capsule Take 100 mg by mouth daily as needed for mild constipation.      hydrochlorothiazide (HYDRODIURIL) 25 MG tablet Take 25 mg by mouth every morning.      metoprolol tartrate (LOPRESSOR) 25 MG tablet TAKE 1 TABLET BY MOUTH TWICE A DAY 180 tablet 3   Multiple Vitamins-Minerals (ICAPS MV PO) Take 1 capsule by mouth every morning.      sodium chloride (OCEAN) 0.65 % SOLN nasal spray Place 1 spray into both nostrils as needed for  congestion.     No current facility-administered medications for this visit.   Allergies:  Penicillins, Ace inhibitors, Aspirin, and Sulfa antibiotics   ROS: No falls, no syncope.  Physical Exam: VS:  BP 140/80   Pulse 81   Ht 5\' 6"  (1.676 m)   Wt 165 lb (74.8 kg)   SpO2 98%   BMI 26.63 kg/m , BMI Body mass index is 26.63 kg/m.  Wt Readings from Last 3 Encounters:  12/04/20 165 lb (74.8 kg)  02/04/20 178 lb (80.7 kg)  08/12/19 180 lb 6.4 oz (81.8 kg)    General: Patient appears comfortable at rest. HEENT: Conjunctiva and lids normal, wearing a mask. Neck: Supple, no elevated JVP or carotid bruits, no thyromegaly. Lungs: Clear to auscultation, nonlabored breathing at rest. Cardiac: Irregularly irregular, no S3, 1/6  systolic murmur. Extremities: No pitting edema.  ECG:  An ECG dated 02/04/2020 was personally reviewed today and demonstrated:  Atrial fibrillation with poor R wave progression.  Recent Labwork: 01/24/2020: BUN 19; Creatinine, Ser 0.98; Hemoglobin 11.1; Platelets 215; Potassium 3.9; Sodium 137     Component Value Date/Time   CHOL 127 08/14/2019 0823   TRIG 54 08/14/2019 0823   HDL 58 08/14/2019 0823   CHOLHDL 2.2 08/14/2019 0823   VLDL 11 08/14/2019 0823   LDLCALC 58 08/14/2019 0823    Other Studies Reviewed Today:  Echocardiogram 03/12/2019:  1. Left ventricular ejection fraction, by visual estimation, is 55 to  60%. The left ventricle has normal function. There is mildly increased  left ventricular hypertrophy.   2. Left ventricular diastolic parameters are indeterminate.   3. The left ventricle has no regional wall motion abnormalities.   4. Global right ventricle has normal systolic function.The right  ventricular size is normal. No increase in right ventricular wall  thickness.   5. Left atrial size was moderately dilated.   6. Right atrial size was mildly dilated.   7. Moderate calcification of the mitral valve leaflet(s).   8. Moderate mitral annular calcification.   9. Moderate thickening of the mitral valve leaflet(s).  10. The mitral valve is normal in structure. Mild mitral valve  regurgitation. No evidence of mitral stenosis.  11. The tricuspid valve is normal in structure.  12. The tricuspid valve is normal in structure. Tricuspid valve  regurgitation is moderate.  13. The aortic valve is tricuspid. Aortic valve regurgitation is mild.  Mild aortic valve sclerosis without stenosis.  14. Pulmonic regurgitation is moderate.  15. The pulmonic valve was normal in structure. Pulmonic valve  regurgitation is moderate.  16. Aortic dilatation noted.  17. There is mild dilatation of the aortic root measuring 39 mm.  18. Moderately elevated pulmonary artery systolic  pressure.  19. The inferior vena cava is normal in size with greater than 50%  respiratory variability, suggesting right atrial pressure of 3 mmHg.   Assessment and Plan:  Permanent atrial fibrillation, asymptomatic on Lopressor.  CHA2DS2-VASc score is 5, she remains on Eliquis for stroke prophylaxis.  Follow-up CBC and BMET.  Medication Adjustments/Labs and Tests Ordered: Current medicines are reviewed at length with the patient today.  Concerns regarding medicines are outlined above.   Tests Ordered: No orders of the defined types were placed in this encounter.   Medication Changes: No orders of the defined types were placed in this encounter.   Disposition:  Follow up  6 months.  Signed, Satira Sark, MD, [facility] 12/04/2020 1:55 PM    [facility]  [facility] [address], [place], [place] [address] Phone: [phone]; Fax: [phone]

## 2020-12-04 ENCOUNTER — Encounter: Payer: Self-pay | Admitting: Cardiology

## 2020-12-04 ENCOUNTER — Other Ambulatory Visit: Payer: Self-pay

## 2020-12-04 ENCOUNTER — Ambulatory Visit: Payer: Medicare HMO | Admitting: Cardiology

## 2020-12-04 ENCOUNTER — Other Ambulatory Visit (HOSPITAL_COMMUNITY)
Admission: RE | Admit: 2020-12-04 | Discharge: 2020-12-04 | Disposition: A | Discharge status: Home / Self Care | Payer: Medicare HMO | Source: Ambulatory Visit | Attending: Cardiology | Admitting: Cardiology

## 2020-12-04 VITALS — BP 140/80 | HR 81 | Ht 66.0 in | Wt 165.0 lb

## 2020-12-04 DIAGNOSIS — I4821 Permanent atrial fibrillation: Secondary | ICD-10-CM

## 2020-12-04 LAB — BASIC METABOLIC PANEL
Anion gap: 7 (ref 5–15)
BUN: 19 mg/dL (ref 8–23)
CO2: 26 mmol/L (ref 22–32)
Calcium: 9.8 mg/dL (ref 8.9–10.3)
Chloride: 104 mmol/L (ref 98–111)
Creatinine, Ser: 0.97 mg/dL (ref 0.44–1.00)
GFR, Estimated: 59 mL/min — ABNORMAL LOW (ref >60)
Glucose, Bld: 114 mg/dL — ABNORMAL HIGH (ref 70–99)
Potassium: 4.5 mmol/L (ref 3.5–5.1)
Sodium: 137 mmol/L (ref 135–145)

## 2020-12-04 LAB — CBC
HCT: 36.6 % (ref 36.0–46.0)
Hemoglobin: 11.8 g/dL — ABNORMAL LOW (ref 12.0–15.0)
MCH: 28.4 pg (ref 26.0–34.0)
MCHC: 32.2 g/dL (ref 30.0–36.0)
MCV: 88 fL (ref 80.0–100.0)
Platelets: 225 10*3/uL (ref 150–400)
RBC: 4.16 MIL/uL (ref 3.87–5.11)
RDW: 14.4 % (ref 11.5–15.5)
WBC: 9 10*3/uL (ref 4.0–10.5)
nRBC: 0 % (ref 0.0–0.2)

## 2020-12-04 NOTE — Patient Instructions (Signed)
Medication Instructions:  Your physician recommends that you continue on your current medications as directed. Please refer to the Current Medication list given to you today.  *If you need a refill on your cardiac medications before your next appointment, please call your pharmacy*   Lab Work: BMET/CBC If you have labs (blood work) drawn today and your tests are completely normal, you will receive your results only by: [place] (if you have MyChart) OR A paper copy in the mail If you have any lab test that is abnormal or we need to change your treatment, we will call you to review the results.   Testing/Procedures: None   Follow-Up: At [facility], you and your health needs are our priority.  As part of our continuing mission to provide you with exceptional heart care, we have created designated Provider Care Teams.  These Care Teams include your primary Cardiologist (physician) and Advanced Practice Providers (APPs -  Physician Assistants and Nurse Practitioners) who all work together to provide you with the care you need, when you need it.  We recommend signing up for the patient portal called "MyChart".  Sign up information is provided on this After Visit Summary.  MyChart is used to connect with patients for Virtual Visits (Telemedicine).  Patients are able to view lab/test results, encounter notes, upcoming appointments, etc.  Non-urgent messages can be sent to your provider as well.   To learn more about what you can do with MyChart, go to [email or web address].    Your next appointment:   6 month(s)  The format for your next appointment:   In Person  Provider:   Rozann Lesches, MD   Other Instructions

## 2020-12-23 ENCOUNTER — Telehealth: Payer: Self-pay | Admitting: Cardiology

## 2020-12-23 NOTE — Telephone Encounter (Signed)
Pt came into the office for samples of Eliquis. Pt stated that she was in the donut hole and could not afford her medication. Pt was offered PAF application and samples of Eliquis 5 mg tablets. Pt very appreciative.   Eliquis 5 mg tablets Lot# [id number] Exp: 09/20204

## 2020-12-30 DIAGNOSIS — X32XXXD Exposure to sunlight, subsequent encounter: Secondary | ICD-10-CM | POA: Diagnosis not present

## 2020-12-30 DIAGNOSIS — L570 Actinic keratosis: Secondary | ICD-10-CM | POA: Diagnosis not present

## 2021-01-05 ENCOUNTER — Telehealth: Payer: Self-pay | Admitting: *Deleted

## 2021-01-05 NOTE — Telephone Encounter (Signed)
Called [employer] to check the status of patient assistance application. Notified that patient was approved for assistance through 02/13/21.

## 2021-01-26 DIAGNOSIS — M1991 Primary osteoarthritis, unspecified site: Secondary | ICD-10-CM | POA: Diagnosis not present

## 2021-01-26 DIAGNOSIS — E663 Overweight: Secondary | ICD-10-CM | POA: Diagnosis not present

## 2021-01-26 DIAGNOSIS — J45909 Unspecified asthma, uncomplicated: Secondary | ICD-10-CM | POA: Diagnosis not present

## 2021-01-26 DIAGNOSIS — Z1331 Encounter for screening for depression: Secondary | ICD-10-CM | POA: Diagnosis not present

## 2021-01-26 DIAGNOSIS — Z0000 Encounter for general adult medical examination without abnormal findings: Secondary | ICD-10-CM | POA: Diagnosis not present

## 2021-01-26 DIAGNOSIS — I10 Essential (primary) hypertension: Secondary | ICD-10-CM | POA: Diagnosis not present

## 2021-01-26 DIAGNOSIS — R7309 Other abnormal glucose: Secondary | ICD-10-CM | POA: Diagnosis not present

## 2021-01-26 DIAGNOSIS — Z6826 Body mass index (BMI) 26.0-26.9, adult: Secondary | ICD-10-CM | POA: Diagnosis not present

## 2021-01-26 DIAGNOSIS — I480 Paroxysmal atrial fibrillation: Secondary | ICD-10-CM | POA: Diagnosis not present

## 2021-03-10 DIAGNOSIS — J069 Acute upper respiratory infection, unspecified: Secondary | ICD-10-CM | POA: Diagnosis not present

## 2021-03-29 ENCOUNTER — Other Ambulatory Visit: Payer: Self-pay | Admitting: Physician Assistant

## 2021-07-22 DIAGNOSIS — Z96651 Presence of right artificial knee joint: Secondary | ICD-10-CM | POA: Diagnosis not present

## 2021-07-22 DIAGNOSIS — M1712 Unilateral primary osteoarthritis, left knee: Secondary | ICD-10-CM | POA: Diagnosis not present

## 2022-03-09 ENCOUNTER — Other Ambulatory Visit: Payer: Self-pay

## 2022-03-09 ENCOUNTER — Emergency Department (HOSPITAL_COMMUNITY): Payer: Medicare PPO

## 2022-03-09 ENCOUNTER — Inpatient Hospital Stay (HOSPITAL_COMMUNITY)
Admission: EM | Admit: 2022-03-09 | Discharge: 2022-03-13 | DRG: 871 | Disposition: A | Discharge status: Home Health | Payer: Medicare PPO | Attending: Internal Medicine | Admitting: Internal Medicine

## 2022-03-09 ENCOUNTER — Encounter (HOSPITAL_COMMUNITY): Payer: Self-pay

## 2022-03-09 DIAGNOSIS — Z821 Family history of blindness and visual loss: Secondary | ICD-10-CM

## 2022-03-09 DIAGNOSIS — Z7901 Long term (current) use of anticoagulants: Secondary | ICD-10-CM

## 2022-03-09 DIAGNOSIS — K625 Hemorrhage of anus and rectum: Secondary | ICD-10-CM | POA: Diagnosis not present

## 2022-03-09 DIAGNOSIS — G9341 Metabolic encephalopathy: Secondary | ICD-10-CM | POA: Diagnosis not present

## 2022-03-09 DIAGNOSIS — K219 Gastro-esophageal reflux disease without esophagitis: Secondary | ICD-10-CM | POA: Diagnosis present

## 2022-03-09 DIAGNOSIS — D121 Benign neoplasm of appendix: Secondary | ICD-10-CM | POA: Diagnosis not present

## 2022-03-09 DIAGNOSIS — I10 Essential (primary) hypertension: Secondary | ICD-10-CM | POA: Diagnosis present

## 2022-03-09 DIAGNOSIS — E876 Hypokalemia: Secondary | ICD-10-CM | POA: Diagnosis not present

## 2022-03-09 DIAGNOSIS — C20 Malignant neoplasm of rectum: Secondary | ICD-10-CM | POA: Diagnosis present

## 2022-03-09 DIAGNOSIS — Z8249 Family history of ischemic heart disease and other diseases of the circulatory system: Secondary | ICD-10-CM

## 2022-03-09 DIAGNOSIS — Z882 Allergy status to sulfonamides status: Secondary | ICD-10-CM

## 2022-03-09 DIAGNOSIS — Z880 Allergy status to penicillin: Secondary | ICD-10-CM | POA: Diagnosis not present

## 2022-03-09 DIAGNOSIS — Z888 Allergy status to other drugs, medicaments and biological substances status: Secondary | ICD-10-CM

## 2022-03-09 DIAGNOSIS — I4891 Unspecified atrial fibrillation: Secondary | ICD-10-CM | POA: Diagnosis present

## 2022-03-09 DIAGNOSIS — Z79899 Other long term (current) drug therapy: Secondary | ICD-10-CM

## 2022-03-09 DIAGNOSIS — Z96651 Presence of right artificial knee joint: Secondary | ICD-10-CM | POA: Diagnosis present

## 2022-03-09 DIAGNOSIS — N3000 Acute cystitis without hematuria: Secondary | ICD-10-CM | POA: Diagnosis not present

## 2022-03-09 DIAGNOSIS — Z886 Allergy status to analgesic agent status: Secondary | ICD-10-CM

## 2022-03-09 DIAGNOSIS — A419 Sepsis, unspecified organism: Secondary | ICD-10-CM | POA: Diagnosis present

## 2022-03-09 DIAGNOSIS — T502X5A Adverse effect of carbonic-anhydrase inhibitors, benzothiadiazides and other diuretics, initial encounter: Secondary | ICD-10-CM | POA: Diagnosis not present

## 2022-03-09 DIAGNOSIS — D123 Benign neoplasm of transverse colon: Secondary | ICD-10-CM | POA: Diagnosis not present

## 2022-03-09 DIAGNOSIS — R410 Disorientation, unspecified: Secondary | ICD-10-CM | POA: Diagnosis not present

## 2022-03-09 DIAGNOSIS — Z96641 Presence of right artificial hip joint: Secondary | ICD-10-CM | POA: Diagnosis present

## 2022-03-09 DIAGNOSIS — N179 Acute kidney failure, unspecified: Secondary | ICD-10-CM | POA: Diagnosis not present

## 2022-03-09 DIAGNOSIS — K5900 Constipation, unspecified: Secondary | ICD-10-CM | POA: Diagnosis present

## 2022-03-09 DIAGNOSIS — K921 Melena: Secondary | ICD-10-CM | POA: Diagnosis present

## 2022-03-09 DIAGNOSIS — N289 Disorder of kidney and ureter, unspecified: Secondary | ICD-10-CM | POA: Diagnosis not present

## 2022-03-09 DIAGNOSIS — B338 Other specified viral diseases: Secondary | ICD-10-CM | POA: Diagnosis not present

## 2022-03-09 DIAGNOSIS — J205 Acute bronchitis due to respiratory syncytial virus: Secondary | ICD-10-CM | POA: Diagnosis present

## 2022-03-09 DIAGNOSIS — Z1152 Encounter for screening for COVID-19: Secondary | ICD-10-CM | POA: Diagnosis not present

## 2022-03-09 DIAGNOSIS — E860 Dehydration: Secondary | ICD-10-CM | POA: Diagnosis not present

## 2022-03-09 DIAGNOSIS — R6520 Severe sepsis without septic shock: Secondary | ICD-10-CM | POA: Diagnosis present

## 2022-03-09 DIAGNOSIS — K51411 Inflammatory polyps of colon with rectal bleeding: Secondary | ICD-10-CM | POA: Diagnosis not present

## 2022-03-09 DIAGNOSIS — N390 Urinary tract infection, site not specified: Principal | ICD-10-CM | POA: Insufficient documentation

## 2022-03-09 DIAGNOSIS — Z825 Family history of asthma and other chronic lower respiratory diseases: Secondary | ICD-10-CM | POA: Diagnosis not present

## 2022-03-09 DIAGNOSIS — I480 Paroxysmal atrial fibrillation: Secondary | ICD-10-CM | POA: Diagnosis present

## 2022-03-09 DIAGNOSIS — A4151 Sepsis due to Escherichia coli [E. coli]: Principal | ICD-10-CM

## 2022-03-09 DIAGNOSIS — K6289 Other specified diseases of anus and rectum: Secondary | ICD-10-CM | POA: Diagnosis not present

## 2022-03-09 DIAGNOSIS — R918 Other nonspecific abnormal finding of lung field: Secondary | ICD-10-CM | POA: Diagnosis not present

## 2022-03-09 DIAGNOSIS — R531 Weakness: Secondary | ICD-10-CM | POA: Diagnosis not present

## 2022-03-09 DIAGNOSIS — Z832 Family history of diseases of the blood and blood-forming organs and certain disorders involving the immune mechanism: Secondary | ICD-10-CM

## 2022-03-09 DIAGNOSIS — R4182 Altered mental status, unspecified: Secondary | ICD-10-CM | POA: Diagnosis not present

## 2022-03-09 LAB — URINALYSIS, ROUTINE W REFLEX MICROSCOPIC
Bilirubin Urine: NEGATIVE
Glucose, UA: NEGATIVE mg/dL
Ketones, ur: 20 mg/dL — AB
Nitrite: POSITIVE — AB
Protein, ur: 30 mg/dL — AB
Specific Gravity, Urine: 1.013 (ref 1.005–1.030)
WBC, UA: >50 WBC/hpf — ABNORMAL HIGH (ref 0–5)
pH: 5 (ref 5.0–8.0)

## 2022-03-09 LAB — COMPREHENSIVE METABOLIC PANEL
ALT: 9 U/L (ref 0–44)
AST: 21 U/L (ref 15–41)
Albumin: 4.1 g/dL (ref 3.5–5.0)
Alkaline Phosphatase: 75 U/L (ref 38–126)
Anion gap: 12 (ref 5–15)
BUN: 37 mg/dL — ABNORMAL HIGH (ref 8–23)
CO2: 22 mmol/L (ref 22–32)
Calcium: 10.7 mg/dL — ABNORMAL HIGH (ref 8.9–10.3)
Chloride: 105 mmol/L (ref 98–111)
Creatinine, Ser: 1.71 mg/dL — ABNORMAL HIGH (ref 0.44–1.00)
GFR, Estimated: 30 mL/min — ABNORMAL LOW (ref >60)
Glucose, Bld: 138 mg/dL — ABNORMAL HIGH (ref 70–99)
Potassium: 4.1 mmol/L (ref 3.5–5.1)
Sodium: 139 mmol/L (ref 135–145)
Total Bilirubin: 1 mg/dL (ref 0.3–1.2)
Total Protein: 8.7 g/dL — ABNORMAL HIGH (ref 6.5–8.1)

## 2022-03-09 LAB — CBC
HCT: 41.7 % (ref 36.0–46.0)
Hemoglobin: 13.5 g/dL (ref 12.0–15.0)
MCH: 27.1 pg (ref 26.0–34.0)
MCHC: 32.4 g/dL (ref 30.0–36.0)
MCV: 83.6 fL (ref 80.0–100.0)
Platelets: 329 10*3/uL (ref 150–400)
RBC: 4.99 MIL/uL (ref 3.87–5.11)
RDW: 14 % (ref 11.5–15.5)
WBC: 15.1 10*3/uL — ABNORMAL HIGH (ref 4.0–10.5)
nRBC: 0 % (ref 0.0–0.2)

## 2022-03-09 LAB — RESP PANEL BY RT-PCR (RSV, FLU A&B, COVID)  RVPGX2
Influenza A by PCR: NEGATIVE
Influenza B by PCR: NEGATIVE
Resp Syncytial Virus by PCR: POSITIVE — AB
SARS Coronavirus 2 by RT PCR: NEGATIVE

## 2022-03-09 LAB — LACTIC ACID, PLASMA: Lactic Acid, Venous: 1.4 mmol/L (ref 0.5–1.9)

## 2022-03-09 LAB — CBG MONITORING, ED: Glucose-Capillary: 138 mg/dL — ABNORMAL HIGH (ref 70–99)

## 2022-03-09 MED ORDER — METOPROLOL TARTRATE 25 MG PO TABS
25.0000 mg | ORAL_TABLET | Freq: Two times a day (BID) | ORAL | Status: DC
  Administered 2022-03-09 – 2022-03-13 (×8): 25 mg via ORAL
  Filled 2022-03-09 (×8): qty 1

## 2022-03-09 MED ORDER — GUAIFENESIN-DM 100-10 MG/5ML PO SYRP: 10.0000 mL | ORAL_SOLUTION | ORAL | Status: DC | PRN

## 2022-03-09 MED ORDER — ONDANSETRON HCL 4 MG/2ML IJ SOLN: 4.0000 mg | Freq: Four times a day (QID) | INTRAMUSCULAR | Status: DC | PRN

## 2022-03-09 MED ORDER — APIXABAN 2.5 MG PO TABS
2.5000 mg | ORAL_TABLET | Freq: Two times a day (BID) | ORAL | Status: DC
  Administered 2022-03-09: 2.5 mg via ORAL
  Filled 2022-03-09 (×2): qty 1

## 2022-03-09 MED ORDER — METOPROLOL TARTRATE 5 MG/5ML IV SOLN: 5.0000 mg | INTRAVENOUS | Status: DC | PRN

## 2022-03-09 MED ORDER — POLYETHYLENE GLYCOL 3350 17 G PO PACK: 17.0000 g | PACK | Freq: Every day | ORAL | Status: DC | PRN

## 2022-03-09 MED ORDER — CIPROFLOXACIN IN D5W 400 MG/200ML IV SOLN
400.0000 mg | Freq: Once | INTRAVENOUS | Status: AC
  Administered 2022-03-09: 400 mg via INTRAVENOUS
  Filled 2022-03-09: qty 200

## 2022-03-09 MED ORDER — SODIUM CHLORIDE 0.9 % IV BOLUS
1000.0000 mL | Freq: Once | INTRAVENOUS | Status: AC
  Administered 2022-03-09: 1000 mL via INTRAVENOUS

## 2022-03-09 MED ORDER — AMLODIPINE BESYLATE 5 MG PO TABS
5.0000 mg | ORAL_TABLET | Freq: Every day | ORAL | Status: DC
  Administered 2022-03-09 – 2022-03-13 (×5): 5 mg via ORAL
  Filled 2022-03-09 (×5): qty 1

## 2022-03-09 MED ORDER — APIXABAN 5 MG PO TABS: 5.0000 mg | ORAL_TABLET | Freq: Two times a day (BID) | ORAL | Status: DC

## 2022-03-09 MED ORDER — ONDANSETRON HCL 4 MG PO TABS: 4.0000 mg | ORAL_TABLET | Freq: Four times a day (QID) | ORAL | Status: DC | PRN

## 2022-03-09 MED ORDER — ACETAMINOPHEN 325 MG PO TABS: 650.0000 mg | ORAL_TABLET | Freq: Four times a day (QID) | ORAL | Status: DC | PRN

## 2022-03-09 MED ORDER — ACETAMINOPHEN 650 MG RE SUPP: 650.0000 mg | Freq: Four times a day (QID) | RECTAL | Status: DC | PRN

## 2022-03-09 MED ORDER — HEPARIN SODIUM (PORCINE) 5000 UNIT/ML IJ SOLN: 5000.0000 [IU] | Freq: Three times a day (TID) | INTRAMUSCULAR | Status: DC

## 2022-03-09 MED ORDER — CIPROFLOXACIN IN D5W 400 MG/200ML IV SOLN: 400.0000 mg | INTRAVENOUS | Status: DC

## 2022-03-09 MED ORDER — SODIUM CHLORIDE 0.9 % IV SOLN: INTRAVENOUS | Status: DC

## 2022-03-09 NOTE — ED Notes (Signed)
Urine collected via In and Out cath. Pt. Tolerated well. Urine set to lab.

## 2022-03-09 NOTE — ED Provider Notes (Signed)
[facility] Provider Note   CSN: [id number] Arrival date & time: 03/09/22  0175     History  Chief Complaint  Patient presents with   Weakness    Bailey Wilson is a 82 y.o. female.  HPI Presents with her son assists with the history. She presents with concern for several days of weakness.  She notes that she has been weaker for more than a few days has had anorexia, minimal p.o. intake, symptoms as well as 1 pop tart per day.  She may or may not have been taking her medication regularly, according to him, though she states that she has been doing so. She acknowledges multiple medical problems, including A-fib when discussed specifically, cannot specify others. She denies focal pain, denies focal weakness, denies fever, vomiting, diarrhea.     Home Medications Prior to Admission medications   Medication Sig Start Date End Date Taking? Authorizing Provider  albuterol (PROVENTIL HFA;VENTOLIN HFA) 108 (90 Base) MCG/ACT inhaler Inhale 2 puffs into the lungs every 6 (six) hours as needed for wheezing or shortness of breath.   Yes [provider]  amLODipine (NORVASC) 5 MG tablet TAKE 1 TABLET BY MOUTH EVERY DAY IN THE MORNING 03/29/21  Yes Satira Sark, MD  apixaban (ELIQUIS) 5 MG TABS tablet TAKE 1 TABLET BY MOUTH TWICE A DAY 10/20/20  Yes Satira Sark, MD  ARTIFICIAL TEAR OP Apply 2 drops to eye every morning.    Yes [provider]  AZO-CRANBERRY PO Take 2 tablets by mouth every morning.    Yes [provider]  Calcium 600-200 MG-UNIT tablet Take 1 tablet by mouth daily.   Yes [provider]  calcium carbonate (TUMS - DOSED IN MG ELEMENTAL CALCIUM) 500 MG chewable tablet Chew 1 tablet by mouth daily as needed for indigestion or heartburn.   Yes [provider]  cetirizine (ZYRTEC) 10 MG tablet Take 10 mg by mouth every morning.    Yes [provider]  docusate sodium (COLACE)  100 MG capsule Take 100 mg by mouth daily as needed for mild constipation.    Yes [provider]  hydrochlorothiazide (HYDRODIURIL) 25 MG tablet Take 25 mg by mouth every morning.    Yes [provider]  metoprolol tartrate (LOPRESSOR) 25 MG tablet TAKE 1 TABLET BY MOUTH TWICE A DAY 06/30/20  Yes Skeet Latch, MD  Multiple Vitamins-Minerals (ICAPS MV PO) Take 1 capsule by mouth every morning.    Yes [provider]  sodium chloride (OCEAN) 0.65 % SOLN nasal spray Place 1 spray into both nostrils as needed for congestion.   Yes [provider]      Allergies    Penicillins, Ace inhibitors, Aspirin, and Sulfa antibiotics    Review of Systems   Review of Systems  All other systems reviewed and are negative.   Physical Exam Updated Vital Signs BP 128/81   Pulse (!) 109   Temp 98.4 F (36.9 C) (Axillary)   Resp 14   Ht '5\' 7"'$  (1.702 m)   Wt 63.5 kg   SpO2 90%   BMI 21.93 kg/m  Physical Exam Vitals and nursing note reviewed.  Constitutional:      General: She is not in acute distress.    Appearance: She is well-developed.  HENT:     Head: Normocephalic and atraumatic.  Eyes:     Conjunctiva/sclera: Conjunctivae normal.  Cardiovascular:     Rate and Rhythm: Normal rate  and regular rhythm.  Pulmonary:     Effort: Pulmonary effort is normal. No respiratory distress.     Breath sounds: Normal breath sounds. No stridor.  Abdominal:     General: There is no distension.  Skin:    General: Skin is warm and dry.  Neurological:     Mental Status: She is alert and oriented to person, place, and time.     Cranial Nerves: No cranial nerve deficit.  Psychiatric:        Mood and Affect: Mood normal.     ED Results / Procedures / Treatments   Labs (all labs ordered are listed, but only abnormal results are displayed) Labs Reviewed  RESP PANEL BY RT-PCR (RSV, FLU A&B, COVID)  RVPGX2 - Abnormal; Notable for the following components:       Result Value   Resp Syncytial Virus by PCR POSITIVE (*)    All other components within normal limits  CBC - Abnormal; Notable for the following components:   WBC 15.1 (*)    All other components within normal limits  URINALYSIS, ROUTINE W REFLEX MICROSCOPIC - Abnormal; Notable for the following components:   APPearance HAZY (*)    Hgb urine dipstick SMALL (*)    Ketones, ur 20 (*)    Protein, ur 30 (*)    Nitrite POSITIVE (*)    Leukocytes,Ua LARGE (*)    WBC, UA >50 (*)    Bacteria, UA RARE (*)    All other components within normal limits  COMPREHENSIVE METABOLIC PANEL - Abnormal; Notable for the following components:   Glucose, Bld 138 (*)    BUN 37 (*)    Creatinine, Ser 1.71 (*)    Calcium 10.7 (*)    Total Protein 8.7 (*)    GFR, Estimated 30 (*)    All other components within normal limits  CBG MONITORING, ED - Abnormal; Notable for the following components:   Glucose-Capillary 138 (*)    All other components within normal limits    EKG EKG Interpretation  Date/Time:  Wednesday March 09 2022 10:29:02 EST Ventricular Rate:  155 PR Interval:    QRS Duration: 87 QT Interval:  242 QTC Calculation: 389 R Axis:   9 Text Interpretation: Atrial fibrillation with rapid V-rate Anteroseptal infarct, old Repolarization abnormality, prob rate related Abnormal ECG Confirmed by Carmin Muskrat [phone]) on 03/09/2022 10:29:46 AM  Radiology No results found.  Procedures Procedures    Medications Ordered in ED Medications  ciprofloxacin (CIPRO) IVPB 400 mg (has no administration in time range)  sodium chloride 0.9 % bolus 1,000 mL (0 mLs Intravenous Stopped 03/09/22 1124)    ED Course/ Medical Decision Making/ A&P                             Medical Decision Making Adult female with multiple medical problems including A-fib presents with concern for weakness.  According to family numbers there is concern for confusion as well.  Differential including encephalopathy,  hemorrhage, dehydration, infection, renal dysfunction all considerations.  Labs sent x-ray CT ordered. Patient received fluid resuscitation as she had evidence for atrial fibrillation with rapid ventricular response on arrival. Cardiac 120, 130, A-fib, abnormal Pulse ox 99% room air normal   Amount and/or Complexity of Data Reviewed Independent Historian:     Details: Adult child as above External Data Reviewed: notes. Labs: ordered. Decision-making details documented in ED Course. Radiology: ordered and independent interpretation performed. Decision-making  details documented in ED Course. ECG/medicine tests: ordered and independent interpretation performed. Decision-making details documented in ED Course.  Risk Prescription drug management. Decision regarding hospitalization.  Following initial fluid resuscitation patient's heart rate went from the 150 range to the 120 range.  3:30 PM Patient awake, alert, heart rate now below 100, though A-fib is still present.  I reviewed all findings with her, and previously had discussed initial results with her son.  Patient's findings are most notable for evidence for urinary tract infection, acute kidney injury, and volume loss in her head CT. Patient was also in atrial fibrillation with rapid ventricular response, although that seems to have improved markedly with fluid resuscitation, consistent with patient's demonstration of acute kidney injury likely contributing to that. She is on Eliquis, has no chest pain, no dyspnea, blood pressure has improved and she is not a candidate for cardioversion currently, he has that she requires additional treatment for urinary tract infection, AKI. Patient did require admission for further monitoring, management.        Final Clinical Impression(s) / ED Diagnoses Final diagnoses:  Lower urinary tract infectious disease  AKI (acute kidney injury) ([place])  Delirium  Atrial fibrillation with rapid  ventricular response ([place])   CRITICAL CARE Performed by: Carmin Muskrat Total critical care time: 35 minutes Critical care time was exclusive of separately billable procedures and treating other patients. Critical care was necessary to treat or prevent imminent or life-threatening deterioration. Critical care was time spent personally by me on the following activities: development of treatment plan with patient and/or surrogate as well as nursing, discussions with consultants, evaluation of patient's response to treatment, examination of patient, obtaining history from patient or surrogate, ordering and performing treatments and interventions, ordering and review of laboratory studies, ordering and review of radiographic studies, pulse oximetry and re-evaluation of patient's condition.    Carmin Muskrat, MD 03/09/22 [phone]

## 2022-03-09 NOTE — Assessment & Plan Note (Addendum)
Meeting severe sepsis criteria with tachycardia-hear rates up to 150s, now 90s, leukocytosis 15.1.,  Evidence of endorgan dysfunction AKI.  No significant change in mental status.  UA suggestive of UTI with positive nitrites large leukocytes.  No recent urine cultures. -Check lactic acid -Add on urine cultures -Blood cultures - 1 L bolus given, continue N/s 75cc/hr x 20hrs -IV ciprofloxacin 400 mg twice daily (per pharmacy no documentation of  tolerating cephalosporins)

## 2022-03-09 NOTE — Assessment & Plan Note (Signed)
Heart rate increased to 150s in the ED, improved with hydration now 90s.  Has not taken her medications including metoprolol and Eliquis in the past 2 days. -Resume metoprolol 25 mg twice daily -IV metoprolol 5 mg PRN for heart rate greater than 120 -Resume Eliquis

## 2022-03-09 NOTE — Assessment & Plan Note (Signed)
Creatinine 1.7, baseline about 0.9.  Likely due to poor oral intake, in setting of HCTZ use. -Hold HCTZ -Hydrate- 1 L bolus cont N/s 75cc/hr x 20 hrs

## 2022-03-09 NOTE — Assessment & Plan Note (Signed)
Stable. -Resume metoprolol, Norvasc  - Hold HCTZ to allow for hydration

## 2022-03-09 NOTE — H&P (Addendum)
History and Physical    Bailey Wilson ZTI:[id number] DOB: 04-Apr-1940 DOA: 03/09/2022  PCP: Sharilyn Sites, MD   Patient coming from: Home  I have personally briefly reviewed patient's old medical records in [place]  Chief Complaint: Confusion  HPI: Bailey Wilson is a 82 y.o. female with medical history significant for hypertension, cardiac murmur, paroxysmal atrial fibrillation. Patient was brought to the ED with reports of nasal congestion, and decreased oral intake over the past 2 to 3 days.  She reports generalized weakness, pain with urination, dark urine also.  Per triage notes, patient has had some confusion, daughter [name] is at bedside, reports the confusion was mostly orientation to time this happened a couple of times.  Otherwise speech has been normal. No chest pain.  No difficulty breathing.  She has not taken any of her medications in the past 2 days-including metoprolol and Eliquis.  ED Course: Temperature 98.5.  Heart rate mostly 79 - 109.  Creatinine elevated 1.7.  Leukocytosis of 15.1.  UA with positive nitrites large leukocytes.  Head CT with progressive microvascular ischemic changes volume loss, suggest acute sinusitis. RSV positive IV ciprofloxacin given. 1 L bolus given. While in the ED heart rate is improved increased to 150s-atrial fibrillation with RVR, and improved with fluids. Hospitalist to admit for UTI, AKI.  Review of Systems: As per HPI all other systems reviewed and negative.  Past Medical History:  Diagnosis Date   Anemia    Carotid stenosis 06/03/2016   1-39% bilateral ICA stenosis 05/2016   Essential hypertension    GERD (gastroesophageal reflux disease)    History of pneumonia    Osteoarthritis    Right knee   Paroxysmal atrial fibrillation (HCC)    Seasonal allergies     Past Surgical History:  Procedure Laterality Date   ABDOMINAL HYSTERECTOMY  1985   CATARACT EXTRACTION W/ INTRAOCULAR LENS  IMPLANT, BILATERAL Bilateral 2002-2007    "left-right"   COLONOSCOPY     [place]   "prior to hysterectomy"   FRACTURE SURGERY     JOINT REPLACEMENT     NASAL FRACTURE SURGERY  2016   REPLACEMENT TOTAL KNEE Right    TONSILLECTOMY     TOTAL HIP ARTHROPLASTY Right 07/04/2016   TOTAL HIP ARTHROPLASTY Right 07/04/2016   Procedure: TOTAL HIP ARTHROPLASTY ANTERIOR APPROACH;  Surgeon: Frederik Pear, MD;  Location: [facility];  Service: Orthopedics;  Laterality: Right;   TOTAL KNEE ARTHROPLASTY Right 05/10/2017   Procedure: RIGHT TOTAL KNEE ARTHROPLASTY;  Surgeon: Frederik Pear, MD;  Location: [facility];  Service: Orthopedics;  Laterality: Right;     reports that she has never smoked. She has never used smokeless tobacco. She reports that she does not drink alcohol and does not use drugs.  Allergies  Allergen Reactions   Penicillins Hives and Itching    Has patient had a PCN reaction causing immediate rash, facial/tongue/throat swelling, SOB or lightheadedness with hypotension: YES Has patient had a PCN reaction causing severe rash involving mucus membranes or skin necrosis: No Has patient had a PCN reaction that required hospitalization No Has patient had a PCN reaction occurring within the last 10 years: No If all of the above answers are "NO", then may proceed with Cephalosporin use.    Ace Inhibitors Cough   Aspirin Other (See Comments)    Heart racing (large doses)   Sulfa Antibiotics Itching and Rash    Family History  Problem Relation Age of Onset  Asthma Mother    Arrhythmia Mother    Heart disease Mother    Heart disease Father    Clotting disorder Father    Hypertension Paternal Grandfather    Blindness Paternal Grandmother      Prior to Admission medications   Medication Sig Start Date End Date Taking? Authorizing Provider  albuterol (PROVENTIL HFA;VENTOLIN HFA) 108 (90 Base) MCG/ACT inhaler Inhale 2 puffs into the lungs every 6 (six) hours as needed for wheezing or shortness of breath.    Yes [provider]  amLODipine (NORVASC) 5 MG tablet TAKE 1 TABLET BY MOUTH EVERY DAY IN THE MORNING 03/29/21  Yes Satira Sark, MD  apixaban (ELIQUIS) 5 MG TABS tablet TAKE 1 TABLET BY MOUTH TWICE A DAY 10/20/20  Yes Satira Sark, MD  ARTIFICIAL TEAR OP Apply 2 drops to eye every morning.    Yes [provider]  AZO-CRANBERRY PO Take 2 tablets by mouth every morning.    Yes [provider]  Calcium 600-200 MG-UNIT tablet Take 1 tablet by mouth daily.   Yes [provider]  calcium carbonate (TUMS - DOSED IN MG ELEMENTAL CALCIUM) 500 MG chewable tablet Chew 1 tablet by mouth daily as needed for indigestion or heartburn.   Yes [provider]  cetirizine (ZYRTEC) 10 MG tablet Take 10 mg by mouth every morning.    Yes [provider]  docusate sodium (COLACE) 100 MG capsule Take 100 mg by mouth daily as needed for mild constipation.    Yes [provider]  hydrochlorothiazide (HYDRODIURIL) 25 MG tablet Take 25 mg by mouth every morning.    Yes [provider]  metoprolol tartrate (LOPRESSOR) 25 MG tablet TAKE 1 TABLET BY MOUTH TWICE A DAY 06/30/20  Yes Skeet Latch, MD  Multiple Vitamins-Minerals (ICAPS MV PO) Take 1 capsule by mouth every morning.    Yes [provider]  sodium chloride (OCEAN) 0.65 % SOLN nasal spray Place 1 spray into both nostrils as needed for congestion.   Yes [provider]    Physical Exam: Vitals:   03/09/22 1300 03/09/22 1400 03/09/22 1449 03/09/22 1500  BP: 129/85 130/70  128/81  Pulse:   (!) 103 (!) 109  Resp: '20 19  14  '$ Temp:   98.4 F (36.9 C)   TempSrc:   Axillary   SpO2:   99% 90%  Weight:      Height:        Constitutional: NAD, calm, comfortable Vitals:   03/09/22 1300 03/09/22 1400 03/09/22 1449 03/09/22 1500  BP: 129/85 130/70  128/81  Pulse:   (!) 103 (!) 109  Resp: '20 19  14  '$ Temp:   98.4 F (36.9 C)   TempSrc:   Axillary   SpO2:   99%  90%  Weight:      Height:       Eyes: PERRL, lids and conjunctivae normal ENMT: Mucous membranes are moist.   Neck: normal, supple, no masses, no thyromegaly Respiratory: clear to auscultation bilaterally, no wheezing, no crackles. Normal respiratory effort. No accessory muscle use.  Cardiovascular: Tachycardic, irregular rate and rhythm, no murmurs / rubs / gallops. No extremity edema. 2+ pedal pulses.  Abdomen: no tenderness, no masses palpated. No hepatosplenomegaly. Bowel sounds positive.  Musculoskeletal: no clubbing / cyanosis. No joint deformity upper and lower extremities.  Skin: no rashes, lesions, ulcers. No induration Neurologic: No apparent cranial nerve abnormalities, moving extremity spontaneously.  Psychiatric: Normal judgment and insight. Alert and  oriented x 3. Normal mood.   Labs on Admission: I have personally reviewed following labs and imaging studies  CBC: Recent Labs  Lab 03/09/22 1016  WBC 15.1*  HGB 13.5  HCT 41.7  MCV 83.6  PLT 258   Basic Metabolic Panel: Recent Labs  Lab 03/09/22 1024  NA 139  K 4.1  CL 105  CO2 22  GLUCOSE 138*  BUN 37*  CREATININE 1.71*  CALCIUM 10.7*   GFR: Estimated Creatinine Clearance: 24.7 mL/min (A) (by C-G formula based on SCr of 1.71 mg/dL (H)). Liver Function Tests: Recent Labs  Lab 03/09/22 1024  AST 21  ALT 9  ALKPHOS 75  BILITOT 1.0  PROT 8.7*  ALBUMIN 4.1   CBG: Recent Labs  Lab 03/09/22 1022  GLUCAP 138*   Urine analysis:    Component Value Date/Time   COLORURINE YELLOW 03/09/2022 1440   APPEARANCEUR HAZY (A) 03/09/2022 1440   LABSPEC 1.013 03/09/2022 1440   PHURINE 5.0 03/09/2022 1440   GLUCOSEU NEGATIVE 03/09/2022 1440   HGBUR SMALL (A) 03/09/2022 1440   BILIRUBINUR NEGATIVE 03/09/2022 1440   KETONESUR 20 (A) 03/09/2022 1440   PROTEINUR 30 (A) 03/09/2022 1440   NITRITE POSITIVE (A) 03/09/2022 1440   LEUKOCYTESUR LARGE (A) 03/09/2022 1440    Radiological Exams on Admission: No  results found.  EKG: Independently reviewed.  Atrial fibrillation rate 155, QTc 389.  No significant ST-T wave changes from prior.  Assessment/Plan Principal Problem:   AKI (acute kidney injury) ([place]) Active Problems:   Sepsis secondary to UTI ([place])   RSV infection   Atrial fibrillation with RVR ([place])   Essential hypertension  Assessment and Plan: * AKI (acute kidney injury) ([place]) Creatinine 1.7, baseline about 0.9.  Likely due to poor oral intake, in setting of HCTZ use. -Hold HCTZ -Hydrate- 1 L bolus cont N/s 75cc/hr x 20 hrs  Atrial fibrillation with RVR (HCC) Heart rate increased to 150s in the ED, improved with hydration now 90s.  Has not taken her medications including metoprolol and Eliquis in the past 2 days. -Resume metoprolol 25 mg twice daily -IV metoprolol 5 mg PRN for heart rate greater than 120 -Resume Eliquis  RSV infection Nasal congestion, cough.  Chest x-ray clear, not hypoxic.  Lung exam unremarkable. -Mucolytics as needed  Sepsis secondary to UTI [facility]) Meeting severe sepsis criteria with tachycardia-hear rates up to 150s, now 90s, leukocytosis 15.1.,  Evidence of endorgan dysfunction AKI.  No significant change in mental status.  UA suggestive of UTI with positive nitrites large leukocytes.  No recent urine cultures. -Check lactic acid -Add on urine cultures -Blood cultures - 1 L bolus given, continue N/s 75cc/hr x 20hrs -IV ciprofloxacin 400 mg twice daily (per pharmacy no documentation of  tolerating cephalosporins)  Essential hypertension Stable. -Resume metoprolol, Norvasc  - Hold HCTZ to allow for hydration   DVT prophylaxis: Eliquis Code Status: Full-family patient and daughter [name] at bedside. Family Communication: Daughter [name] at bedside is patient's power of attorney. Disposition Plan: ~/> 2 days Consults called: None Admission status: Inpt tele I certify that at the point of admission it is my clinical judgment that the patient will  require inpatient hospital care spanning beyond 2 midnights from the point of admission due to high intensity of service, high risk for further deterioration and high frequency of surveillance required.   Author: Bethena Roys, MD 03/09/2022 6:57 PM  For on call review [email or web address].

## 2022-03-09 NOTE — Assessment & Plan Note (Signed)
Nasal congestion, cough.  Chest x-ray clear, not hypoxic.  Lung exam unremarkable. -Mucolytics as needed

## 2022-03-09 NOTE — ED Triage Notes (Signed)
Pt presents to ED with complaints of nasal congestion. Son states she hasn't really ate or drank in the last couple of days, states her urine has been orange and states she has had generalized weakness

## 2022-03-10 DIAGNOSIS — G9341 Metabolic encephalopathy: Secondary | ICD-10-CM | POA: Insufficient documentation

## 2022-03-10 DIAGNOSIS — K625 Hemorrhage of anus and rectum: Secondary | ICD-10-CM | POA: Diagnosis not present

## 2022-03-10 DIAGNOSIS — N3000 Acute cystitis without hematuria: Secondary | ICD-10-CM | POA: Diagnosis not present

## 2022-03-10 DIAGNOSIS — A419 Sepsis, unspecified organism: Secondary | ICD-10-CM | POA: Diagnosis not present

## 2022-03-10 DIAGNOSIS — N179 Acute kidney failure, unspecified: Secondary | ICD-10-CM | POA: Diagnosis not present

## 2022-03-10 DIAGNOSIS — N390 Urinary tract infection, site not specified: Secondary | ICD-10-CM | POA: Insufficient documentation

## 2022-03-10 LAB — BASIC METABOLIC PANEL
Anion gap: 9 (ref 5–15)
BUN: 28 mg/dL — ABNORMAL HIGH (ref 8–23)
CO2: 21 mmol/L — ABNORMAL LOW (ref 22–32)
Calcium: 9.3 mg/dL (ref 8.9–10.3)
Chloride: 106 mmol/L (ref 98–111)
Creatinine, Ser: 1.08 mg/dL — ABNORMAL HIGH (ref 0.44–1.00)
GFR, Estimated: 51 mL/min — ABNORMAL LOW (ref >60)
Glucose, Bld: 101 mg/dL — ABNORMAL HIGH (ref 70–99)
Potassium: 3.5 mmol/L (ref 3.5–5.1)
Sodium: 136 mmol/L (ref 135–145)

## 2022-03-10 LAB — CBC
HCT: 35 % — ABNORMAL LOW (ref 36.0–46.0)
Hemoglobin: 11 g/dL — ABNORMAL LOW (ref 12.0–15.0)
MCH: 26.5 pg (ref 26.0–34.0)
MCHC: 31.4 g/dL (ref 30.0–36.0)
MCV: 84.3 fL (ref 80.0–100.0)
Platelets: 237 10*3/uL (ref 150–400)
RBC: 4.15 MIL/uL (ref 3.87–5.11)
RDW: 13.5 % (ref 11.5–15.5)
WBC: 10.9 10*3/uL — ABNORMAL HIGH (ref 4.0–10.5)
nRBC: 0 % (ref 0.0–0.2)

## 2022-03-10 LAB — TSH: TSH: 0.44 u[IU]/mL (ref 0.350–4.500)

## 2022-03-10 LAB — VITAMIN B12: Vitamin B-12: 633 pg/mL (ref 180–914)

## 2022-03-10 LAB — PROCALCITONIN: Procalcitonin: <0.1 ng/mL

## 2022-03-10 LAB — AMMONIA: Ammonia: 38 umol/L — ABNORMAL HIGH (ref 9–35)

## 2022-03-10 LAB — T4, FREE: Free T4: 1.46 ng/dL — ABNORMAL HIGH (ref 0.61–1.12)

## 2022-03-10 LAB — FOLATE: Folate: 13.5 ng/mL (ref >5.9)

## 2022-03-10 MED ORDER — CIPROFLOXACIN IN D5W 400 MG/200ML IV SOLN
400.0000 mg | Freq: Two times a day (BID) | INTRAVENOUS | Status: AC
  Administered 2022-03-10 – 2022-03-12 (×6): 400 mg via INTRAVENOUS
  Filled 2022-03-10 (×6): qty 200

## 2022-03-10 MED ORDER — SODIUM CHLORIDE 0.9 % IV SOLN: INTRAVENOUS | Status: AC

## 2022-03-10 NOTE — Progress Notes (Signed)
PROGRESS NOTE  Bailey Wilson SHF:[id number] DOB: 02-May-1940 DOA: 03/09/2022 PCP: Sharilyn Sites, MD  Brief History:  82 year old female with a history of hypertension, paroxysmal atrial fibrillation presenting with generalized weakness, poor oral intake, and confusion for 3 to 4 days.  At baseline, the patient is able to perform her ADLs and drive.  She endorses coughing, chest, and nasal congestion for the past 3 to 4 days.  She has also had some dysuria.  There is no fevers, chills, chest pain, shortness breath, nausea, vomiting, direct abdominal pain. In the ED, the patient was afebrile and hemodynamically stable with oxygen saturation 98% room air.  WBC 15.1, hemoglobin 13.5, platelets 229,000.  Sodium 136, potassium 3.5, bicarbonate 21, serum creatinine 1.71.  Albumin 4.1, calcium 10.7.  RSV PCR was positive.  CT of the brain was negative, but there was frothy secretions in the bilateral sphenoid, left frontal, and left maxillary sinus.  UA showed >50 WBC.  EKG showed atrial fibrillation with RVR and nonspecific ST changes.  Lactic acid 1.4.  LFTs were unremarkable.  The patient was started on IV fluids and ciprofloxacin.   Assessment/Plan: Acute metabolic encephalopathy -Multifactorial including AKI, hypercalcemia, UTI, RSV bronchitis -Slowly improving but not at baseline -CT brain negative -Continue IV fluids -TSH -B12  Sepsis -presented with tachycardia, leukocytosis and AMS -due to UTI -continue IVF -lactic 1.4 -follow blood and urine culture  Hypercalcemia -Albumin 4.1 -Calcium 10.7 -Secondary to HCTZ -Anticipate improvement with IV fluids and holding HCTZ  UTI -UA> 50 WBC -Continue ciprofloxacin pending culture data  AKI -Baseline creatinine 0.9-1.0 -Presented with serum creatinine 1.71 -Secondary to volume depletion -Improving with IV fluids  Hematochezia -baseline Hgb 11 -GI consult -hold apixaban -pt endorses small volume hematochezia x  1  week  Paroxysmal atrial fibrillation with RVR -RVR improved -holding apixaban temporarily -remain on tele -continue metoprolol  RSV bronchitis -Stable on room air -Supportive care  Essential hypertension -Continue amlodipine and metoprolol tartrate -Holding HCTZ  Generalized weakness -B12 -TSH -PT evaluation       Family Communication:   daughter updated 1/25  Consultants:  GI  Code Status:  FULL   DVT Prophylaxis:  apixaban on hold   Procedures: As Listed in Progress Note Above  Antibiotics: Cipro 1/24>>      Subjective:  Patient denies fevers, chills, headache, chest pain, dyspnea, nausea, vomiting, diarrhea, abdominal pain, dysuria, hematuria, hematochezia, and melena.  Objective: Vitals:   03/09/22 1931 03/09/22 1958 03/10/22 0006 03/10/22 0323  BP: (!) 140/81 (!) 137/90 (!) 141/80 (!) 140/95  Pulse:  92 86 90  Resp:  '18 20 18  '$ Temp:  98.6 F (37 C) 98.7 F (37.1 C) 98.8 F (37.1 C)  TempSrc:      SpO2:  98% 98% 98%  Weight:      Height:        Intake/Output Summary (Last 24 hours) at 03/10/2022 0805 Last data filed at 03/10/2022 5027 Gross per 24 hour  Intake 1236.06 ml  Output 200 ml  Net 1036.06 ml   Weight change:  Exam:  General:  Pt is alert, follows commands appropriately, not in acute distress HEENT: No icterus, No thrush, No neck mass, [place]/AT Cardiovascular: IRRR, S1/S2, no rubs, no gallops Respiratory: scattered bilateral rales. No wheeze Abdomen: Soft/+BS, non tender, non distended, no guarding Extremities: No edema, No lymphangitis, No petechiae, No rashes, no synovitis   Data Reviewed: I have personally reviewed following labs and  imaging studies Basic Metabolic Panel: Recent Labs  Lab 03/09/22 1024 03/10/22 0400  NA 139 136  K 4.1 3.5  CL 105 106  CO2 22 21*  GLUCOSE 138* 101*  BUN 37* 28*  CREATININE 1.71* 1.08*  CALCIUM 10.7* 9.3   Liver Function Tests: Recent Labs  Lab 03/09/22 1024  AST 21  ALT  9  ALKPHOS 75  BILITOT 1.0  PROT 8.7*  ALBUMIN 4.1   No results for input(s): "LIPASE", "AMYLASE" in the last 168 hours. No results for input(s): "AMMONIA" in the last 168 hours. Coagulation Profile: No results for input(s): "INR", "PROTIME" in the last 168 hours. CBC: Recent Labs  Lab 03/09/22 1016 03/10/22 0400  WBC 15.1* 10.9*  HGB 13.5 11.0*  HCT 41.7 35.0*  MCV 83.6 84.3  PLT 329 237   Cardiac Enzymes: No results for input(s): "CKTOTAL", "CKMB", "CKMBINDEX", "TROPONINI" in the last 168 hours. BNP: Invalid input(s): "POCBNP" CBG: Recent Labs  Lab 03/09/22 1022  GLUCAP 138*   HbA1C: No results for input(s): "HGBA1C" in the last 72 hours. Urine analysis:    Component Value Date/Time   COLORURINE YELLOW 03/09/2022 1440   APPEARANCEUR HAZY (A) 03/09/2022 1440   LABSPEC 1.013 03/09/2022 1440   PHURINE 5.0 03/09/2022 1440   GLUCOSEU NEGATIVE 03/09/2022 1440   HGBUR SMALL (A) 03/09/2022 1440   BILIRUBINUR NEGATIVE 03/09/2022 1440   KETONESUR 20 (A) 03/09/2022 1440   PROTEINUR 30 (A) 03/09/2022 1440   NITRITE POSITIVE (A) 03/09/2022 1440   LEUKOCYTESUR LARGE (A) 03/09/2022 1440   Sepsis Labs: '@LABRCNTIP'$ (procalcitonin:4,lacticidven:4) ) Recent Results (from the past 240 hour(s))  Resp panel by RT-PCR (RSV, Flu A&B, Covid) Anterior Nasal Swab     Status: Abnormal   Collection Time: 03/09/22 10:16 AM   Specimen: Anterior Nasal Swab  Result Value Ref Range Status   SARS Coronavirus 2 by RT PCR NEGATIVE NEGATIVE Final    Comment: (NOTE) SARS-CoV-2 target nucleic acids are NOT DETECTED.  The SARS-CoV-2 RNA is generally detectable in upper respiratory specimens during the acute phase of infection. The lowest concentration of SARS-CoV-2 viral copies this assay can detect is 138 copies/mL. A negative result does not preclude SARS-Cov-2 infection and should not be used as the sole basis for treatment or other patient management decisions. A negative result may occur  with  improper specimen collection/handling, submission of specimen other than nasopharyngeal swab, presence of viral mutation(s) within the areas targeted by this assay, and inadequate number of viral copies(<138 copies/mL). A negative result must be combined with clinical observations, patient history, and epidemiological information. The expected result is Negative.  Fact Sheet for Patients:  EntrepreneurPulse.com.au  Fact Sheet for Healthcare Providers:  IncredibleEmployment.be  This test is no t yet approved or cleared by the [place] FDA and  has been authorized for detection and/or diagnosis of SARS-CoV-2 by FDA under an Emergency Use Authorization (EUA). This EUA will remain  in effect (meaning this test can be used) for the duration of the COVID-19 declaration under Section 564(b)(1) of the Act, 21 U.S.C.section 360bbb-3(b)(1), unless the authorization is terminated  or revoked sooner.       Influenza A by PCR NEGATIVE NEGATIVE Final   Influenza B by PCR NEGATIVE NEGATIVE Final    Comment: (NOTE) The Xpert Xpress SARS-CoV-2/FLU/RSV plus assay is intended as an aid in the diagnosis of influenza from Nasopharyngeal swab specimens and should not be used as a sole basis for treatment. Nasal washings and aspirates are unacceptable for Xpert  Xpress SARS-CoV-2/FLU/RSV testing.  Fact Sheet for Patients: EntrepreneurPulse.com.au  Fact Sheet for Healthcare Providers: IncredibleEmployment.be  This test is not yet approved or cleared by the [place] FDA and has been authorized for detection and/or diagnosis of SARS-CoV-2 by FDA under an Emergency Use Authorization (EUA). This EUA will remain in effect (meaning this test can be used) for the duration of the COVID-19 declaration under Section 564(b)(1) of the Act, 21 U.S.C. section 360bbb-3(b)(1), unless the authorization is terminated  or revoked.     Resp Syncytial Virus by PCR POSITIVE (A) NEGATIVE Final    Comment: (NOTE) Fact Sheet for Patients: EntrepreneurPulse.com.au  Fact Sheet for Healthcare Providers: IncredibleEmployment.be  This test is not yet approved or cleared by the [place] FDA and has been authorized for detection and/or diagnosis of SARS-CoV-2 by FDA under an Emergency Use Authorization (EUA). This EUA will remain in effect (meaning this test can be used) for the duration of the COVID-19 declaration under Section 564(b)(1) of the Act, 21 U.S.C. section 360bbb-3(b)(1), unless the authorization is terminated or revoked.  Performed at [facility], [address] [place], [place] [address]   Culture, blood (Routine X 2) w Reflex to ID Panel     Status: None (Preliminary result)   Collection Time: 03/09/22  6:37 PM   Specimen: BLOOD  Result Value Ref Range Status   Specimen Description BLOOD BLOOD LEFT ARM  Final   Special Requests   Final    BOTTLES DRAWN AEROBIC AND ANAEROBIC Blood Culture adequate volume Performed at [facility], [address], [place], [place] [address]    Culture PENDING  Incomplete   Report Status PENDING  Incomplete  Culture, blood (Routine X 2) w Reflex to ID Panel     Status: None (Preliminary result)   Collection Time: 03/09/22  6:38 PM   Specimen: BLOOD  Result Value Ref Range Status   Specimen Description BLOOD BLOOD RIGHT ARM  Final   Special Requests   Final    BOTTLES DRAWN AEROBIC AND ANAEROBIC Blood Culture adequate volume Performed at [facility], [address] [place], [place] [address]    Culture PENDING  Incomplete   Report Status PENDING  Incomplete     Scheduled Meds:  amLODipine  5 mg Oral Daily   apixaban  2.5 mg Oral BID   metoprolol tartrate  25 mg Oral BID   Continuous Infusions:  sodium chloride 75 mL/hr at 03/10/22 0528   ciprofloxacin      Procedures/Studies: CT Head Wo  Contrast  Result Date: 03/09/2022 CLINICAL DATA:  Altered mental status EXAM: CT HEAD WITHOUT CONTRAST TECHNIQUE: Contiguous axial images were obtained from the base of the skull through the vertex without intravenous contrast. RADIATION DOSE REDUCTION: This exam was performed according to the departmental dose-optimization program which includes automated exposure control, adjustment of the mA and/or kV according to patient size and/or use of iterative reconstruction technique. COMPARISON:  CT Brain 01/26/15 FINDINGS: Brain: Compared to prior exam there is marked interval increase in periventricular and subcortical hypodensity, which is nonspecific, but favored to represent progressive chronic microvascular ischemic change. There is also slight interval increase in size of the bilateral lateral ventricles, favored to be secondary to volume loss. No evidence of hemorrhage, hydrocephalus, extra-axial collection or mass lesion/mass effect. Vascular: Air and cavernous sinus, is likely related to recent intervention. Skull: Normal. Negative for fracture or focal lesion. Sinuses/Orbits: Frothy secretions in the bilateral sphenoid, left maxillary, and left frontal sinus. No mastoid or  middle ear effusion. Right lens replacement. Other: None IMPRESSION: 1. Compared to prior exam there is marked interval increase in periventricular and subcortical hypodensity, which is nonspecific, but favored to represent progressive chronic microvascular ischemic change. There is also slight interval increase in size of the bilateral lateral ventricles, favored to be secondary to interval volume loss. No hemorrhage or CT evidence of a new cortical infarct. 2. Frothy secretions in the bilateral sphenoid, left maxillary, and left frontal sinus, which could represent acute sinusitis in the appropriate clinical setting. Electronically Signed   By: Marin Roberts M.D.   On: 03/09/2022 12:32    Orson Eva, DO  [facility]  If  7PM-7AM, please contact night-coverage [email or web address] Password [email or web address] 03/10/2022, 8:05 AM   LOS: 1 day

## 2022-03-10 NOTE — Plan of Care (Signed)
  Problem: Acute Rehab PT Goals(only PT should resolve) Goal: Pt Will Go Supine/Side To Sit Flowsheets (Taken 03/10/2022 1053) Pt will go Supine/Side to Sit: Independently Goal: Patient Will Transfer Sit To/From Stand Flowsheets (Taken 03/10/2022 1053) Patient will transfer sit to/from stand:  Independently  with modified independence Goal: Pt Will Transfer Bed To Chair/Chair To Bed Flowsheets (Taken 03/10/2022 1053) Pt will Transfer Bed to Chair/Chair to Bed:  Independently  with modified independence Goal: Pt Will Ambulate Flowsheets (Taken 03/10/2022 1053) Pt will Ambulate:  75 feet  Independently  with modified independence   Wonda Olds PT, DPT Physical Therapist with [facility] [phone] office

## 2022-03-10 NOTE — Progress Notes (Signed)
BP elevated, other vitals stable. Pt slept through the night. Urine tea colored and had red sediment in it. Pt had multiple small bowel movements that were dark red with some mucous during the night. MD notified, FOBT ordered. Diminished breath sounds noted. Non-pitting edema assessed in bilateral lower extremities.

## 2022-03-10 NOTE — TOC Initial Note (Signed)
Transition of Care [facility]) - Initial/Assessment Note    Patient Details  Name: Bailey Wilson MRN: [id number] Date of Birth: 1940-11-09  Transition of Care [place]) CM/SW Contact:    Salome Arnt, LCSW Phone Number: 03/10/2022, 10:58 AM  Clinical Narrative:  Pt admitted due to acute kidney injury. Assessment completed with pt who reports her grandson lives with her. She has a cane and a walker at home. PT evaluated pt and recommend HHPT. Pt states she plans to go stay with her daughter in [place] when she is d/c. Pt plans to talk to her daughter today about whether they are interested in home health or not and will ask her about agency preference if so. Pt requests that TOC follow up in AM for decision.                  Expected Discharge Plan: [place] Barriers to Discharge: Continued Medical Work up   Patient Goals and CMS Choice Patient states their goals for this hospitalization and ongoing recovery are:: return home   Choice offered to / list presented to : Patient [place] ownership interest in [facility].provided to::  (n/a)    Expected Discharge Plan and Services In-house Referral: Clinical Social Work   Post Acute Care Choice: [place] arrangements for the past 2 months: [place]                                      Prior Living Arrangements/Services Living arrangements for the past 2 months: Single Family Home Lives with:: Relatives Patient language and need for interpreter reviewed:: Yes Do you feel safe going back to the place where you live?: Yes        Care giver support system in place?: Yes (comment) Current home services: DME (walker, cane) Criminal Activity/Legal Involvement Pertinent to Current Situation/Hospitalization: No - Comment as needed  Activities of Daily Living Home Assistive Devices/Equipment: Cane (specify quad or straight) ADL Screening (condition at time of  admission) Patient's cognitive ability adequate to safely complete daily activities?: Yes Is the patient deaf or have difficulty hearing?: No Does the patient have difficulty seeing, even when wearing glasses/contacts?: No Does the patient have difficulty concentrating, remembering, or making decisions?: No Patient able to express need for assistance with ADLs?: Yes Does the patient have difficulty dressing or bathing?: No Independently performs ADLs?: Yes (appropriate for developmental age) Does the patient have difficulty walking or climbing stairs?: Yes Weakness of Legs: Right Weakness of Arms/Hands: None  Permission Sought/Granted                  Emotional Assessment     Affect (typically observed): Appropriate Orientation: : Oriented to Self, Oriented to Place, Oriented to  Time, Oriented to Situation Alcohol / Substance Use: Not Applicable Psych Involvement: No (comment)  Admission diagnosis:  Lower urinary tract infectious disease [N39.0] Delirium [R41.0] Atrial fibrillation with rapid ventricular response (HCC) [I48.91] AKI (acute kidney injury) ([place]) [N17.9] Patient Active Problem List   Diagnosis Date Noted   UTI (urinary tract infection) 25/95/6387   Acute metabolic encephalopathy 56/43/3295   Hypercalcemia 03/10/2022   Rectal bleeding 03/10/2022   AKI (acute kidney injury) ([place]) 03/09/2022   Sepsis due to undetermined organism ([place]) 03/09/2022   RSV infection 03/09/2022   Atrial fibrillation with RVR ([place]) 03/09/2022   Primary osteoarthritis of right knee  05/10/2017   H/O total hip arthroplasty, 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   Arthritis of right hip 07/04/2016   Carotid stenosis 06/03/2016   Murmur 05/06/2016   Essential hypertension 04/20/2016   Environmental and seasonal allergies    Osteoarthritis of right knee    PCP:  Sharilyn Sites, MD Pharmacy:   [id number] [place] [address] [address]Phone: [phone]: [phone]    Social Determinants of Health (SDOH) Social History: SDOH Screenings   Food Insecurity: No Food Insecurity (03/09/2022)  Housing: Low Risk  (03/09/2022)  Transportation Needs: No Transportation Needs (03/09/2022)  Utilities: Not At Risk (03/09/2022)  Tobacco Use: Low Risk  (03/09/2022)   SDOH Interventions:     Readmission Risk Interventions     No data to display

## 2022-03-10 NOTE — Evaluation (Signed)
Physical Therapy Evaluation Patient Details Name: Bailey Wilson MRN: [id number] DOB: 05-Oct-1940 Today's Date: 03/10/2022  History of Present Illness  82 year old female with a history of hypertension, paroxysmal atrial fibrillation presenting with generalized weakness, poor oral intake, and confusion for 3 to 4 days.  At baseline, the patient is able to perform her ADLs and drive.  She endorses coughing, chest, and nasal congestion for the past 3 to 4 days.  She has also had some dysuria.  There is no fevers, chills, chest pain, shortness breath, nausea, vomiting, direct abdominal pain.  Clinical Impression   Pt is a 82yo female presenting to [facility] due to reason stated in the HPI.   Pt tolerated today's Physical Therapy Evaluation, noted mild fatigue throughout therapy session however was pleasantly agreeable to in session sitting in recliner. Pt performed modified independent w/ bed mobility, supervision with RW w/ functional transfers, and supervision with RW for gait/ambulation.   Based upon objective information, pt's functional deficits and impairments included reduced functional activity tolerance, reduced ambulatory capacity, reduced balance during functional transfers and mobility secondary to muscle weakness, activity tolerance deficits, and poor postural/balance strategies. Based upon these deficits/impairments, pt would benefit from skilled acute physical therapy services to address the above deficits and improve their functional status.  PT recommends pt discharge to home with home health PT services in order to improve pt's functional status, safety and independence with functional mobility and overall QOL.             Recommendations for follow up therapy are one component of a multi-disciplinary discharge planning process, led by the attending physician.  Recommendations may be updated based on patient status, additional functional criteria and  insurance authorization.  Follow Up Recommendations Home health PT      Assistance Recommended at Discharge PRN  Patient can return home with the following  A little help with bathing/dressing/bathroom;Assistance with cooking/housework    Equipment Recommendations Rolling walker (2 wheels)  Recommendations for Other Services       Functional Status Assessment Patient has had a recent decline in their functional status and demonstrates the ability to make significant improvements in function in a reasonable and predictable amount of time.     Precautions / Restrictions Precautions Precautions: None Restrictions Weight Bearing Restrictions: No      Mobility  Bed Mobility Overal bed mobility: Needs Assistance Bed Mobility: Supine to Sit     Supine to sit: Modified independent (Device/Increase time), Supervision     General bed mobility comments: Modified independent with use of bed rail given with verbal cues Patient Response: Cooperative  Transfers Overall transfer level: Needs assistance Equipment used: Rolling walker (2 wheels) Transfers: Sit to/from Stand, Bed to chair/wheelchair/BSC Sit to Stand: Modified independent (Device/Increase time)           General transfer comment: Multiple sit/stand from EOB, modified independent with RW and verbal cues for proper hand placement.  Verbal cues during stand step tray for from EOB to recliner, verbal cues for rolling walker management to/from chair.  Patient demonstrating no eccentric control and with educated on importance of using BUE for her trial descent into 2 sitting position to reduce risk of fall or further injury.    Ambulation/Gait Ambulation/Gait assistance: Supervision Gait Distance (Feet): 50 Feet Assistive device: Rolling walker (2 wheels) Gait Pattern/deviations: Step-through pattern, Decreased step length - right, Decreased step length - left, Decreased stride length, Shuffle       General Gait  Details: Slow, guarded shuffling gait pattern with rolling walker care at min guard at supervision level with verbal cues for proper placement of RW management.  Initial trial of ambulation noted with once all LOB on left side patient reporting, "just my left leg getting stiff."  Stairs            Wheelchair Mobility    Modified Rankin (Stroke Patients Only)       Balance Overall balance assessment: Needs assistance Sitting-balance support: No upper extremity supported Sitting balance-Leahy Scale: Good Sitting balance - Comments: Good/good sitting static/dynamic balance.  Mild posterior lean noted when performing shoulder flexion, intermittently using UE support but able to maintain sitting balance with no UE support. Postural control: Posterior lean Standing balance support: Bilateral upper extremity supported Standing balance-Leahy Scale: Good Standing balance comment: Good/good with standing balance in both static/dynamic activities with use of RW.  Delayed postural reactions and poor ankle strategies.                             Pertinent Vitals/Pain Pain Assessment Pain Assessment: Faces Faces Pain Scale: No hurt    Home Living Family/patient expects to be discharged to:: Private residence Living Arrangements: Other relatives Available Help at Discharge: Available 24 hours/day Type of Home: House Home Access: Stairs to enter Entrance Stairs-Rails: Right;Left;Can reach both Entrance Stairs-Number of Steps: 3   Home Layout: One level Home Equipment: Grab bars - toilet;[place]ower [place] (2 wheels) Additional Comments: Pt reports having RW at home.    Prior Function Prior Level of Function : Independent/Modified Independent             Mobility Comments: Patient reports being independent with cane at baseline for community and household ambulation.  Just prior to the hospital admission, patient reported needing send to intermittently help get  up from the bed.  Patient and daughter confirm prior hospital mobility very independent. ADLs Comments: Patient reports independent with cooking, cleaning, dressing, and bathing and was driving.     Hand Dominance   Dominant Hand: Right    Extremity/Trunk Assessment   Upper Extremity Assessment Upper Extremity Assessment: Overall WFL for tasks assessed    Lower Extremity Assessment Lower Extremity Assessment: Overall WFL for tasks assessed (4 -/5 for BLE MMT.  Sitting hip flexion, knee extension, ankle DF.)    Cervical / Trunk Assessment Cervical / Trunk Assessment: Kyphotic  Communication   Communication: No difficulties  Cognition Arousal/Alertness: Awake/alert Behavior During Therapy: WFL for tasks assessed/performed Overall Cognitive Status: Within Functional Limits for tasks assessed                                          General Comments      Exercises     Assessment/Plan    PT Assessment Patient needs continued PT services  PT Problem List Decreased strength;Decreased activity tolerance;Decreased balance;Decreased mobility       PT Treatment Interventions Gait training;Functional mobility training;Therapeutic activities;Therapeutic exercise;Balance training    PT Goals (Current goals can be found in the Care Plan section)  Acute Rehab PT Goals Patient Stated Goal: " Get home and not use a rolling walker" PT Goal Formulation: With patient Time For Goal Achievement: 03/24/22    Frequency Min 2X/week     Co-evaluation  AM-PAC PT "6 Clicks" Mobility  Outcome Measure Help needed turning from your back to your side while in a flat bed without using bedrails?: None Help needed moving from lying on your back to sitting on the side of a flat bed without using bedrails?: A Little Help needed moving to and from a bed to a chair (including a wheelchair)?: None Help needed standing up from a chair using your arms (e.g.,  wheelchair or bedside chair)?: A Little Help needed to walk in hospital room?: A Little Help needed climbing 3-5 steps with a railing? : A Lot 6 Click Score: 19    End of Session Equipment Utilized During Treatment: Gait belt Activity Tolerance: Patient tolerated treatment well Patient left: in chair;with family/visitor present;with call bell/phone within reach Nurse Communication: Mobility status PT Visit Diagnosis: Unsteadiness on feet (R26.81);Other abnormalities of gait and mobility (R26.89);Muscle weakness (generalized) (M62.81)    Time: 3557-3220 PT Time Calculation (min) (ACUTE ONLY): 27 min   Charges:   PT Evaluation $PT Eval Low Complexity: 1 Low         Wonda Olds PT, DPT Physical Therapist with [place] [phone] office   Wonda Olds 03/10/2022, 10:52 AM

## 2022-03-10 NOTE — Consult Note (Signed)
Gastroenterology Consult   Referring Provider: No ref. provider found Primary Care Physician:  Sharilyn Sites, MD Primary Gastroenterologist:  formerly Dr. Laural Golden  Patient ID: Bailey Wilson; [id number]; Oct 09, 1940   Admit date: 03/09/2022  LOS: 1 day   Date of Consultation: 03/10/2022  Reason for Consultation:  rectal bleeding    History of Present Illness   Bailey Wilson is a 82 y.o. female with PMH significant for HTN, Afib who presented to the ED for decreased oral intake for 2-3 days, confusion, nasal congestion.  In the ED she tested positive for RSV. Her creatinine was up at 1.7, baseline around 0.9.  Heart rate initially in the 150s. WBC 15,100, Hgb 13.5. U/A c/w UTI.   Per ER report, she had not taken her metoprolol or eliquis in 2 days. She received dose of Eliquis (2.'5mg'$ ) last night.   Today her Hgb is 11. WBC 10,900, Creatinine 1.08.   GI consulted due to rectal bleeding. Patient states she has noted some blood with wiping for a few days. Since in the hospital she notes blood with wiping after a stool. She also had blood on her bed pad. She states she sometimes has troubles having a BM. Has to strain. States her stool has been normal in color. She has no rectal pain or abdominal pain. Up until this acute illness, she had been eating ok but for a few days, had minimal oral intake. No heartburn or dysphagia. No vomiting. No fever. She takes ibuprofen or aleve on occasion for headaches.  Last colonoscopy 2006, Dr. Laural Golden. She had small external hemorrhoids at that time.       Prior to Admission medications   Medication Sig Start Date End Date Taking? Authorizing Provider  albuterol (PROVENTIL HFA;VENTOLIN HFA) 108 (90 Base) MCG/ACT inhaler Inhale 2 puffs into the lungs every 6 (six) hours as needed for wheezing or shortness of breath.   Yes [provider]  amLODipine (NORVASC) 5 MG tablet TAKE 1 TABLET BY MOUTH EVERY DAY IN THE MORNING 03/29/21  Yes Satira Sark, MD  apixaban (ELIQUIS) 5 MG TABS tablet TAKE 1 TABLET BY MOUTH TWICE A DAY 10/20/20  Yes Satira Sark, MD  ARTIFICIAL TEAR OP Apply 2 drops to eye every morning.    Yes [provider]  AZO-CRANBERRY PO Take 2 tablets by mouth every morning.    Yes [provider]  Calcium 600-200 MG-UNIT tablet Take 1 tablet by mouth daily.   Yes [provider]  calcium carbonate (TUMS - DOSED IN MG ELEMENTAL CALCIUM) 500 MG chewable tablet Chew 1 tablet by mouth daily as needed for indigestion or heartburn.   Yes [provider]  cetirizine (ZYRTEC) 10 MG tablet Take 10 mg by mouth every morning.    Yes [provider]  docusate sodium (COLACE) 100 MG capsule Take 100 mg by mouth daily as needed for mild constipation.    Yes [provider]  hydrochlorothiazide (HYDRODIURIL) 25 MG tablet Take 25 mg by mouth every morning.    Yes [provider]  metoprolol tartrate (LOPRESSOR) 25 MG tablet TAKE 1 TABLET BY MOUTH TWICE A DAY 06/30/20  Yes Skeet Latch, MD  Multiple Vitamins-Minerals (ICAPS MV PO) Take 1 capsule by mouth every morning.    Yes [provider]  sodium chloride (OCEAN) 0.65 % SOLN nasal spray Place 1 spray into both nostrils as needed for congestion.   Yes [provider]    Current Facility-Administered  Medications  Medication Dose Route Frequency Provider Last Rate Last Admin   0.9 %  sodium chloride infusion   Intravenous Continuous Tat, Shanon Brow, MD       acetaminophen (TYLENOL) tablet 650 mg  650 mg Oral Q6H PRN Emokpae, Ejiroghene E, MD       Or   acetaminophen (TYLENOL) suppository 650 mg  650 mg Rectal Q6H PRN Emokpae, Ejiroghene E, MD       amLODipine (NORVASC) tablet 5 mg  5 mg Oral Daily Emokpae, Ejiroghene E, MD   5 mg at 03/10/22 0858   ciprofloxacin (CIPRO) IVPB 400 mg  400 mg Intravenous Q12H Tat, Shanon Brow, MD       guaiFENesin-dextromethorphan (ROBITUSSIN DM) 100-10 MG/5ML syrup 10 mL  10  mL Oral Q4H PRN Emokpae, Ejiroghene E, MD       metoprolol tartrate (LOPRESSOR) injection 5 mg  5 mg Intravenous Q5 min PRN Emokpae, Ejiroghene E, MD       metoprolol tartrate (LOPRESSOR) tablet 25 mg  25 mg Oral BID Emokpae, Ejiroghene E, MD   25 mg at 03/10/22 0857   ondansetron (ZOFRAN) tablet 4 mg  4 mg Oral Q6H PRN Emokpae, Ejiroghene E, MD       Or   ondansetron (ZOFRAN) injection 4 mg  4 mg Intravenous Q6H PRN Emokpae, Ejiroghene E, MD       polyethylene glycol (MIRALAX / GLYCOLAX) packet 17 g  17 g Oral Daily PRN Emokpae, Ejiroghene E, MD        Allergies as of 03/09/2022 - Review Complete 03/09/2022  Allergen Reaction Noted   Penicillins Hives and Itching 03/21/2011   Ace inhibitors Cough 05/06/2016   Aspirin Other (See Comments) 03/21/2011   Sulfa antibiotics Itching and Rash 06/26/2012    Past Medical History:  Diagnosis Date   Anemia    Carotid stenosis 06/03/2016   1-39% bilateral ICA stenosis 05/2016   Essential hypertension    GERD (gastroesophageal reflux disease)    History of pneumonia    Osteoarthritis    Right knee   Paroxysmal atrial fibrillation (HCC)    Seasonal allergies     Past Surgical History:  Procedure Laterality Date   ABDOMINAL HYSTERECTOMY  1985   CATARACT EXTRACTION W/ INTRAOCULAR LENS  IMPLANT, BILATERAL Bilateral 2002-2007   "left-right"   COLONOSCOPY     [facility]   "prior to hysterectomy"   FRACTURE SURGERY     JOINT REPLACEMENT     NASAL FRACTURE SURGERY  2016   REPLACEMENT TOTAL KNEE Right    TONSILLECTOMY     TOTAL HIP ARTHROPLASTY Right 07/04/2016   TOTAL HIP ARTHROPLASTY Right 07/04/2016   Procedure: TOTAL HIP ARTHROPLASTY ANTERIOR APPROACH;  Surgeon: Frederik Pear, MD;  Location: [facility];  Service: Orthopedics;  Laterality: Right;   TOTAL KNEE ARTHROPLASTY Right 05/10/2017   Procedure: RIGHT TOTAL KNEE ARTHROPLASTY;  Surgeon: Frederik Pear, MD;  Location: [facility];  Service: Orthopedics;  Laterality: Right;     Family History  Problem Relation Age of Onset   Asthma Mother    Arrhythmia Mother    Heart disease Mother    Heart disease Father    Clotting disorder Father    Hypertension Paternal Grandfather    Blindness Paternal Grandmother     Social History   Socioeconomic History   Marital status: Widowed    Spouse name: Not on file   Number of children: Not on file   Years of education: Not on  file   Highest education level: Not on file  Occupational History   Not on file  Tobacco Use   Smoking status: Never   Smokeless tobacco: Never  Vaping Use   Vaping Use: Never used  Substance and Sexual Activity   Alcohol use: No   Drug use: No   Sexual activity: Never  Other Topics Concern   Not on file  Social History Narrative   Not on file   Social Determinants of Health   Financial Resource Strain: Not on file  Food Insecurity: No Food Insecurity (03/09/2022)   Hunger Vital Sign    Worried About Running Out of Food in the Last Year: Never true    Ran Out of Food in the Last Year: Never true  Transportation Needs: No Transportation Needs (03/09/2022)   PRAPARE - Hydrologist (Medical): No    Lack of Transportation (Non-Medical): No  Physical Activity: Not on file  Stress: Not on file  Social Connections: Not on file  Intimate Partner Violence: Not At Risk (03/09/2022)   Humiliation, Afraid, Rape, and Kick questionnaire    Fear of Current or Ex-Partner: No    Emotionally Abused: No    Physically Abused: No    Sexually Abused: No     Review of System:   General: Negative for  weight loss, fever, chills, fatigue. See hpi Eyes: Negative for vision changes.  ENT: Negative for hoarseness, difficulty swallowing. + nasal congestion. CV: Negative for chest pain, angina, palpitations, dyspnea on exertion, peripheral edema.  Respiratory: Negative for dyspnea at rest, dyspnea on exertion,  sputum, wheezing. +cough, mild GI: See history of present  illness. GU:  Negative for dysuria, hematuria, urinary incontinence, urinary frequency, nocturnal urination.  MS: Negative for joint pain, low back pain.  Derm: Negative for rash or itching.  Neuro: Negative for weakness, abnormal sensation, seizure, frequent headaches, memory loss, confusion.  Psych: Negative for anxiety, depression, suicidal ideation, hallucinations.  Endo: Negative for unusual weight change.  Heme: Negative for bruising or bleeding. Allergy: Negative for rash or hives.      Physical Examination:   Vital signs in last 24 hours: Temp:  [98.2 F (36.8 C)-98.8 F (37.1 C)] 98.8 F (37.1 C) (01/25 0323) Pulse Rate:  [78-118] 90 (01/25 0323) Resp:  [14-23] 18 (01/25 0323) BP: (108-147)/(70-106) 140/95 (01/25 0323) SpO2:  [90 %-99 %] 98 % (01/25 0323) Weight:  [63.5 kg] 63.5 kg (01/24 1013) Last BM Date : 03/09/22  General: Well-nourished, well-developed in no acute distress. Daughter [name] at bedside. Head: Normocephalic, atraumatic.   Eyes: Conjunctiva pink, no icterus. Mouth: Oropharyngeal mucosa moist and pink , no lesions erythema or exudate. Neck: Supple without thyromegaly, masses, or lymphadenopathy.  Lungs: Clear to auscultation bilaterally.  Heart: Regular rate and rhythm, no murmurs rubs or gallops.  Abdomen: Bowel sounds are normal, nontender, nondistended, no hepatosplenomegaly or masses, no abdominal bruits or hernia , no rebound or guarding.   Rectal: mass like area just outside anal opening+/-hemorrhoids. She had liquid stool seeping out of rectum and was blood tinged. On DRE, the external lesion continued inside rectum to the reach of index finger, hard, tender exam.  Extremities: No lower extremity edema, clubbing, deformity.  Neuro: Alert and oriented x 4 , grossly normal neurologically.  Skin: Warm and dry, no rash or jaundice.   Psych: Alert and cooperative, normal mood and affect.        Intake/Output from previous day: 01/24 0701 - 01/25  0700 In: 1236.1 [P.O.:240; I.V.:804.5; IV Piggyback:191.5] Out: 200 [Urine:200] Intake/Output this shift: Total I/O In: 240 [P.O.:240] Out: -   Lab Results:   CBC Recent Labs    03/09/22 1016 03/10/22 0400  WBC 15.1* 10.9*  HGB 13.5 11.0*  HCT 41.7 35.0*  MCV 83.6 84.3  PLT 329 237   BMET Recent Labs    03/09/22 1024 03/10/22 0400  NA 139 136  K 4.1 3.5  CL 105 106  CO2 22 21*  GLUCOSE 138* 101*  BUN 37* 28*  CREATININE 1.71* 1.08*  CALCIUM 10.7* 9.3   LFT Recent Labs    03/09/22 1024  BILITOT 1.0  ALKPHOS 75  AST 21  ALT 9  PROT 8.7*  ALBUMIN 4.1    Lipase No results for input(s): "LIPASE" in the last 72 hours.  PT/INR No results for input(s): "LABPROT", "INR" in the last 72 hours.   Hepatitis Panel No results for input(s): "HEPBSAG", "HCVAB", "HEPAIGM", "HEPBIGM" in the last 72 hours.   Imaging Studies:   CT Head Wo Contrast  Result Date: 03/09/2022 CLINICAL DATA:  Altered mental status EXAM: CT HEAD WITHOUT CONTRAST TECHNIQUE: Contiguous axial images were obtained from the base of the skull through the vertex without intravenous contrast. RADIATION DOSE REDUCTION: This exam was performed according to the departmental dose-optimization program which includes automated exposure control, adjustment of the mA and/or kV according to patient size and/or use of iterative reconstruction technique. COMPARISON:  CT Brain 01/26/15 FINDINGS: Brain: Compared to prior exam there is marked interval increase in periventricular and subcortical hypodensity, which is nonspecific, but favored to represent progressive chronic microvascular ischemic change. There is also slight interval increase in size of the bilateral lateral ventricles, favored to be secondary to volume loss. No evidence of hemorrhage, hydrocephalus, extra-axial collection or mass lesion/mass effect. Vascular: Air and cavernous sinus, is likely related to recent intervention. Skull: Normal. Negative for  fracture or focal lesion. Sinuses/Orbits: Frothy secretions in the bilateral sphenoid, left maxillary, and left frontal sinus. No mastoid or middle ear effusion. Right lens replacement. Other: None IMPRESSION: 1. Compared to prior exam there is marked interval increase in periventricular and subcortical hypodensity, which is nonspecific, but favored to represent progressive chronic microvascular ischemic change. There is also slight interval increase in size of the bilateral lateral ventricles, favored to be secondary to interval volume loss. No hemorrhage or CT evidence of a new cortical infarct. 2. Frothy secretions in the bilateral sphenoid, left maxillary, and left frontal sinus, which could represent acute sinusitis in the appropriate clinical setting. Electronically Signed   By: Marin Roberts M.D.   On: 03/09/2022 12:32  [4 week]  Assessment:   82 y/o female with history of HTN, Afib on Eliquis who presents due to confusion, dehydration, in setting of RSV. GI consulted due to rectal bleeding.   Rectal bleeding: patient has noticed intermittent bleeding recently as an outpatient. Complains of issues with constipation. Since admission, noted to have rectal bleeding. Her Hgb declined over night but likely hemodilution likely signficant. Appears her baseline Hgb has been in the 11 range. On DRE, abnormal exam with suspected mass.   Plan:   She will need direct visualization of rectal abnormality either by sigmoidoscopy or colonoscopy. It is unclear when her last dose of Eliquis at home was, she received small dose yesterday. Will discuss timing with Dr. Jenetta Downer.  Hold Eliquis for now. Discussed with patient's nurse.    LOS: 1 day   We would like to  thank you for the opportunity to participate in the care of Bailey Wilson.  Laureen Ochs. Bernarda Caffey [facility] [phone] 1/25/20249:22 AM

## 2022-03-10 NOTE — Hospital Course (Signed)
82 year old female with a history of hypertension, paroxysmal atrial fibrillation presenting with generalized weakness, poor oral intake, and confusion for 3 to 4 days.  At baseline, the patient is able to perform her ADLs and drive.  She endorses coughing, chest, and nasal congestion for the past 3 to 4 days.  She has also had some dysuria.  There is no fevers, chills, chest pain, shortness breath, nausea, vomiting, direct abdominal pain. In the ED, the patient was afebrile and hemodynamically stable with oxygen saturation 98% room air.  WBC 15.1, hemoglobin 13.5, platelets 229,000.  Sodium 136, potassium 3.5, bicarbonate 21, serum creatinine 1.71.  Albumin 4.1, calcium 10.7.  RSV PCR was positive.  CT of the brain was negative, but there was frothy secretions in the bilateral sphenoid, left frontal, and left maxillary sinus.  UA showed >50 WBC.  EKG showed atrial fibrillation with RVR and nonspecific ST changes.  Lactic acid 1.4.  LFTs were unremarkable.  The patient was started on IV fluids and ciprofloxacin.

## 2022-03-11 DIAGNOSIS — N3000 Acute cystitis without hematuria: Secondary | ICD-10-CM | POA: Diagnosis not present

## 2022-03-11 DIAGNOSIS — N179 Acute kidney failure, unspecified: Secondary | ICD-10-CM | POA: Diagnosis not present

## 2022-03-11 DIAGNOSIS — I4891 Unspecified atrial fibrillation: Secondary | ICD-10-CM | POA: Diagnosis not present

## 2022-03-11 DIAGNOSIS — A419 Sepsis, unspecified organism: Secondary | ICD-10-CM | POA: Diagnosis not present

## 2022-03-11 DIAGNOSIS — A4151 Sepsis due to Escherichia coli [E. coli]: Secondary | ICD-10-CM

## 2022-03-11 LAB — COMPREHENSIVE METABOLIC PANEL
ALT: 8 U/L (ref 0–44)
AST: 17 U/L (ref 15–41)
Albumin: 3 g/dL — ABNORMAL LOW (ref 3.5–5.0)
Alkaline Phosphatase: 57 U/L (ref 38–126)
Anion gap: 7 (ref 5–15)
BUN: 17 mg/dL (ref 8–23)
CO2: 23 mmol/L (ref 22–32)
Calcium: 9.3 mg/dL (ref 8.9–10.3)
Chloride: 107 mmol/L (ref 98–111)
Creatinine, Ser: 0.9 mg/dL (ref 0.44–1.00)
GFR, Estimated: >60 mL/min (ref >60)
Glucose, Bld: 94 mg/dL (ref 70–99)
Potassium: 3.5 mmol/L (ref 3.5–5.1)
Sodium: 137 mmol/L (ref 135–145)
Total Bilirubin: 0.5 mg/dL (ref 0.3–1.2)
Total Protein: 6.3 g/dL — ABNORMAL LOW (ref 6.5–8.1)

## 2022-03-11 LAB — GLUCOSE, CAPILLARY: Glucose-Capillary: 101 mg/dL — ABNORMAL HIGH (ref 70–99)

## 2022-03-11 LAB — CBC
HCT: 32.2 % — ABNORMAL LOW (ref 36.0–46.0)
Hemoglobin: 10.2 g/dL — ABNORMAL LOW (ref 12.0–15.0)
MCH: 26.7 pg (ref 26.0–34.0)
MCHC: 31.7 g/dL (ref 30.0–36.0)
MCV: 84.3 fL (ref 80.0–100.0)
Platelets: 230 10*3/uL (ref 150–400)
RBC: 3.82 MIL/uL — ABNORMAL LOW (ref 3.87–5.11)
RDW: 13.5 % (ref 11.5–15.5)
WBC: 9.8 10*3/uL (ref 4.0–10.5)
nRBC: 0 % (ref 0.0–0.2)

## 2022-03-11 LAB — URINE CULTURE: Culture: >=100000 — AB

## 2022-03-11 LAB — MAGNESIUM: Magnesium: 1.5 mg/dL — ABNORMAL LOW (ref 1.7–2.4)

## 2022-03-11 MED ORDER — BISACODYL 5 MG PO TBEC
5.0000 mg | DELAYED_RELEASE_TABLET | Freq: Once | ORAL | Status: AC
  Administered 2022-03-11: 5 mg via ORAL
  Filled 2022-03-11: qty 1

## 2022-03-11 MED ORDER — MAGNESIUM SULFATE 2 GM/50ML IV SOLN
2.0000 g | Freq: Once | INTRAVENOUS | Status: AC
  Administered 2022-03-11: 2 g via INTRAVENOUS
  Filled 2022-03-11: qty 50

## 2022-03-11 MED ORDER — BISACODYL 5 MG PO TBEC
10.0000 mg | DELAYED_RELEASE_TABLET | Freq: Once | ORAL | Status: AC
  Administered 2022-03-11: 10 mg via ORAL
  Filled 2022-03-11: qty 2

## 2022-03-11 MED ORDER — PEG 3350-KCL-NA BICARB-NACL 420 G PO SOLR
4000.0000 mL | Freq: Once | ORAL | Status: AC
  Administered 2022-03-11: 4000 mL via ORAL

## 2022-03-11 NOTE — Anesthesia Preprocedure Evaluation (Signed)
Anesthesia Evaluation  Patient identified by MRN, date of birth, ID band Patient awake    Reviewed: Allergy & Precautions, H&P , NPO status , Patient's Chart, lab work & pertinent test results  Airway Mallampati: II  TM Distance: <3 FB Neck ROM: Full    Dental  (+) Dental Advisory Given, Upper Dentures   Pulmonary neg pulmonary ROS   Pulmonary exam normal breath sounds clear to auscultation       Cardiovascular Exercise Tolerance: Good hypertension, Pt. on medications Normal cardiovascular exam+ dysrhythmias Atrial Fibrillation + Valvular Problems/Murmurs  Rhythm:Regular Rate:Normal     Neuro/Psych negative neurological ROS  negative psych ROS   GI/Hepatic Neg liver ROS,GERD  Medicated and Controlled,,  Endo/Other  negative endocrine ROS    Renal/GU Renal InsufficiencyRenal disease  negative genitourinary   Musculoskeletal  (+) Arthritis , Osteoarthritis,    Abdominal   Peds negative pediatric ROS (+)  Hematology  (+) Blood dyscrasia, anemia   Anesthesia Other Findings   Reproductive/Obstetrics negative OB ROS                             Anesthesia Physical Anesthesia Plan  ASA: 3  Anesthesia Plan: General   Post-op Pain Management: Minimal or no pain anticipated   Induction: Intravenous  PONV Risk Score and Plan: 1 and Treatment may vary due to age or medical condition  Airway Management Planned: Nasal Cannula, Natural Airway and Simple Face Mask  Additional Equipment:   Intra-op Plan:   Post-operative Plan:   Informed Consent: I have reviewed the patients History and Physical, chart, labs and discussed the procedure including the risks, benefits and alternatives for the proposed anesthesia with the patient or authorized representative who has indicated his/her understanding and acceptance.     Dental advisory given  Plan Discussed with: CRNA and Surgeon  Anesthesia  Plan Comments:         Anesthesia Quick Evaluation

## 2022-03-11 NOTE — Care Management Important Message (Signed)
Important Message  Patient Details  Name: Bailey Wilson MRN: [id number] Date of Birth: [birth year]   Medicare Important Message Given:  Yes     Tommy Medal 03/11/2022, 11:20 AM

## 2022-03-11 NOTE — Progress Notes (Addendum)
PROGRESS NOTE  Bailey Wilson QIO:[id number] DOB: [birth year] DOA: 03/09/2022 PCP: Sharilyn Sites, MD  Brief History:  82 year old female with a history of hypertension, paroxysmal atrial fibrillation presenting with generalized weakness, poor oral intake, and confusion for 3 to 4 days.  At baseline, the patient is able to perform her ADLs and drive.  She endorses coughing, chest, and nasal congestion for the past 3 to 4 days.  She has also had some dysuria.  There is no fevers, chills, chest pain, shortness breath, nausea, vomiting, direct abdominal pain. In the ED, the patient was afebrile and hemodynamically stable with oxygen saturation 98% room air.  WBC 15.1, hemoglobin 13.5, platelets 229,000.  Sodium 136, potassium 3.5, bicarbonate 21, serum creatinine 1.71.  Albumin 4.1, calcium 10.7.  RSV PCR was positive.  CT of the brain was negative, but there was frothy secretions in the bilateral sphenoid, left frontal, and left maxillary sinus.  UA showed >50 WBC.  EKG showed atrial fibrillation with RVR and nonspecific ST changes.  Lactic acid 1.4.  LFTs were unremarkable.  The patient was started on IV fluids and ciprofloxacin.   Assessment/Plan: Acute metabolic encephalopathy -Multifactorial including AKI, hypercalcemia, UTI, RSV bronchitis -Slowly improving but not at baseline -CT brain negative -Continue IV fluids -TSH--0.440 -B12--633 -folate--13.5 -continues to improve   Sepsis -presented with tachycardia, leukocytosis and AMS -due to UTI -continue IVF -lactic 1.4 -follow blood and urine culture -sepsis physiology resolved   Hypercalcemia -Albumin 4.1 -Calcium 10.7 -Secondary to HCTZ -Anticipate improvement with IV fluids and holding HCTZ>>improved   UTI--Ecoli -UA> 50 WBC -Continue ciprofloxacin    AKI -Baseline creatinine 0.9-1.0 -Presented with serum creatinine 1.71 -Secondary to volume depletion -Improving with IV fluids   Hematochezia/Rectal  mass -baseline Hgb 11 -GI consult appreciated -hold apixaban -pt endorses small volume hematochezia x  1 week -plan for colonoscopy 1/27   Paroxysmal atrial fibrillation with RVR -RVR improved -holding apixaban temporarily -remain on tele -continue metoprolol   RSV bronchitis -Stable on room air -Supportive care   Essential hypertension -Continue amlodipine and metoprolol tartrate -Holding HCTZ   Generalized weakness -B12--633 -TSH--0.440 -PT evaluation>>HHPT -folic 32.4   Hypomagnesemia -replete           Family Communication:   daughter updated 1/26   Consultants:  GI   Code Status:  FULL    DVT Prophylaxis:  apixaban on hold     Procedures: As Listed in Progress Note Above   Antibiotics: Cipro 1/24>>           Subjective: Patient denies fevers, chills, headache, chest pain, dyspnea, nausea, vomiting, diarrhea, abdominal pain, dysuria, hematuria, hematochezia, and melena.    Objective: Vitals:   03/10/22 2017 03/11/22 0308 03/11/22 0700 03/11/22 1418  BP: 135/84 137/84 (!) 140/89 136/87  Pulse: 90 83 82 71  Resp: '18 18 16 18  '$ Temp: 97.8 F (36.6 C) 98 F (36.7 C) 98.2 F (36.8 C) 98.4 F (36.9 C)  TempSrc: Oral Oral    SpO2: 98% 97% 97% 100%  Weight:      Height:        Intake/Output Summary (Last 24 hours) at 03/11/2022 1636 Last data filed at 03/11/2022 1000 Gross per 24 hour  Intake 1211.25 ml  Output --  Net 1211.25 ml   Weight change:  Exam:  General:  Pt is alert, follows commands appropriately, not in acute distress HEENT: No icterus, No thrush, No neck mass, [place]/AT Cardiovascular: RRR,  S1/S2, no rubs, no gallops Respiratory: bibasilar rales. No wheeze Abdomen: Soft/+BS, non tender, non distended, no guarding Extremities: No edema, No lymphangitis, No petechiae, No rashes, no synovitis   Data Reviewed: I have personally reviewed following labs and imaging studies Basic Metabolic Panel: Recent Labs  Lab  03/09/22 1024 03/10/22 0400 03/11/22 0358  NA 139 136 137  K 4.1 3.5 3.5  CL 105 106 107  CO2 22 21* 23  GLUCOSE 138* 101* 94  BUN 37* 28* 17  CREATININE 1.71* 1.08* 0.90  CALCIUM 10.7* 9.3 9.3  MG  --   --  1.5*   Liver Function Tests: Recent Labs  Lab 03/09/22 1024 03/11/22 0358  AST 21 17  ALT 9 8  ALKPHOS 75 57  BILITOT 1.0 0.5  PROT 8.7* 6.3*  ALBUMIN 4.1 3.0*   No results for input(s): "LIPASE", "AMYLASE" in the last 168 hours. Recent Labs  Lab 03/10/22 0843  AMMONIA 38*   Coagulation Profile: No results for input(s): "INR", "PROTIME" in the last 168 hours. CBC: Recent Labs  Lab 03/09/22 1016 03/10/22 0400 03/11/22 0358  WBC 15.1* 10.9* 9.8  HGB 13.5 11.0* 10.2*  HCT 41.7 35.0* 32.2*  MCV 83.6 84.3 84.3  PLT 329 237 230   Cardiac Enzymes: No results for input(s): "CKTOTAL", "CKMB", "CKMBINDEX", "TROPONINI" in the last 168 hours. BNP: Invalid input(s): "POCBNP" CBG: Recent Labs  Lab 03/09/22 1022  GLUCAP 138*   HbA1C: No results for input(s): "HGBA1C" in the last 72 hours. Urine analysis:    Component Value Date/Time   COLORURINE YELLOW 03/09/2022 1440   APPEARANCEUR HAZY (A) 03/09/2022 1440   LABSPEC 1.013 03/09/2022 1440   PHURINE 5.0 03/09/2022 1440   GLUCOSEU NEGATIVE 03/09/2022 1440   HGBUR SMALL (A) 03/09/2022 1440   BILIRUBINUR NEGATIVE 03/09/2022 1440   KETONESUR 20 (A) 03/09/2022 1440   PROTEINUR 30 (A) 03/09/2022 1440   NITRITE POSITIVE (A) 03/09/2022 1440   LEUKOCYTESUR LARGE (A) 03/09/2022 1440   Sepsis Labs: '@LABRCNTIP'$ (procalcitonin:4,lacticidven:4) ) Recent Results (from the past 240 hour(s))  Resp panel by RT-PCR (RSV, Flu A&B, Covid) Anterior Nasal Swab     Status: Abnormal   Collection Time: 03/09/22 10:16 AM   Specimen: Anterior Nasal Swab  Result Value Ref Range Status   SARS Coronavirus 2 by RT PCR NEGATIVE NEGATIVE Final    Comment: (NOTE) SARS-CoV-2 target nucleic acids are NOT DETECTED.  The SARS-CoV-2  RNA is generally detectable in upper respiratory specimens during the acute phase of infection. The lowest concentration of SARS-CoV-2 viral copies this assay can detect is 138 copies/mL. A negative result does not preclude SARS-Cov-2 infection and should not be used as the sole basis for treatment or other patient management decisions. A negative result may occur with  improper specimen collection/handling, submission of specimen other than nasopharyngeal swab, presence of viral mutation(s) within the areas targeted by this assay, and inadequate number of viral copies(<138 copies/mL). A negative result must be combined with clinical observations, patient history, and epidemiological information. The expected result is Negative.  Fact Sheet for Patients:  EntrepreneurPulse.com.au  Fact Sheet for Healthcare Providers:  IncredibleEmployment.be  This test is no t yet approved or cleared by the [place] FDA and  has been authorized for detection and/or diagnosis of SARS-CoV-2 by FDA under an Emergency Use Authorization (EUA). This EUA will remain  in effect (meaning this test can be used) for the duration of the COVID-19 declaration under Section 564(b)(1) of the Act, 21 U.S.C.section 360bbb-3(b)(1),  unless the authorization is terminated  or revoked sooner.       Influenza A by PCR NEGATIVE NEGATIVE Final   Influenza B by PCR NEGATIVE NEGATIVE Final    Comment: (NOTE) The Xpert Xpress SARS-CoV-2/FLU/RSV plus assay is intended as an aid in the diagnosis of influenza from Nasopharyngeal swab specimens and should not be used as a sole basis for treatment. Nasal washings and aspirates are unacceptable for Xpert Xpress SARS-CoV-2/FLU/RSV testing.  Fact Sheet for Patients: EntrepreneurPulse.com.au  Fact Sheet for Healthcare Providers: IncredibleEmployment.be  This test is not yet approved or cleared by the  [place] FDA and has been authorized for detection and/or diagnosis of SARS-CoV-2 by FDA under an Emergency Use Authorization (EUA). This EUA will remain in effect (meaning this test can be used) for the duration of the COVID-19 declaration under Section 564(b)(1) of the Act, 21 U.S.C. section 360bbb-3(b)(1), unless the authorization is terminated or revoked.     Resp Syncytial Virus by PCR POSITIVE (A) NEGATIVE Final    Comment: (NOTE) Fact Sheet for Patients: EntrepreneurPulse.com.au  Fact Sheet for Healthcare Providers: IncredibleEmployment.be  This test is not yet approved or cleared by the [place] FDA and has been authorized for detection and/or diagnosis of SARS-CoV-2 by FDA under an Emergency Use Authorization (EUA). This EUA will remain in effect (meaning this test can be used) for the duration of the COVID-19 declaration under Section 564(b)(1) of the Act, 21 U.S.C. section 360bbb-3(b)(1), unless the authorization is terminated or revoked.  Performed at [facility], [address] [place], [place] [address]   Urine Culture (for pregnant, neutropenic or urologic patients or patients with an indwelling urinary catheter)     Status: Abnormal   Collection Time: 03/09/22  2:40 PM   Specimen: Urine, Clean Catch  Result Value Ref Range Status   Specimen Description   Final    URINE, CLEAN CATCH Performed at [facility], [address] [place], [place] [address]    Special Requests   Final    NONE Performed at [facility], [address], [place], [place] [address]    Culture >=100,000 COLONIES/mL ESCHERICHIA COLI (A)  Final   Report Status 03/11/2022 FINAL  Final   Organism ID, Bacteria ESCHERICHIA COLI (A)  Final      Susceptibility   Escherichia coli - MIC*    AMPICILLIN <=2 SENSITIVE Sensitive     CEFAZOLIN <=4 SENSITIVE Sensitive     CEFEPIME <=0.12 SENSITIVE Sensitive     CEFTRIAXONE <=0.25 SENSITIVE Sensitive      CIPROFLOXACIN <=0.25 SENSITIVE Sensitive     GENTAMICIN <=1 SENSITIVE Sensitive     IMIPENEM <=0.25 SENSITIVE Sensitive     NITROFURANTOIN <=16 SENSITIVE Sensitive     TRIMETH/SULFA <=20 SENSITIVE Sensitive     AMPICILLIN/SULBACTAM <=2 SENSITIVE Sensitive     PIP/TAZO <=4 SENSITIVE Sensitive     * >=100,000 COLONIES/mL ESCHERICHIA COLI  Culture, blood (Routine X 2) w Reflex to ID Panel     Status: None (Preliminary result)   Collection Time: 03/09/22  6:37 PM   Specimen: BLOOD  Result Value Ref Range Status   Specimen Description BLOOD BLOOD LEFT ARM  Final   Special Requests   Final    BOTTLES DRAWN AEROBIC AND ANAEROBIC Blood Culture adequate volume   Culture   Final    NO GROWTH 2 DAYS Performed at [facility], [address] [place], [place] [address]    Report Status PENDING  Incomplete  Culture, blood (Routine  X 2) w Reflex to ID Panel     Status: None (Preliminary result)   Collection Time: 03/09/22  6:38 PM   Specimen: BLOOD  Result Value Ref Range Status   Specimen Description BLOOD BLOOD RIGHT ARM  Final   Special Requests   Final    BOTTLES DRAWN AEROBIC AND ANAEROBIC Blood Culture adequate volume   Culture   Final    NO GROWTH 2 DAYS Performed at [facility], [address] [place],  [address]    Report Status PENDING  Incomplete     Scheduled Meds:  amLODipine  5 mg Oral Daily   bisacodyl  5 mg Oral Once   metoprolol tartrate  25 mg Oral BID   Continuous Infusions:  ciprofloxacin 400 mg (03/11/22 0837)   magnesium sulfate bolus IVPB      Procedures/Studies: CT Head Wo Contrast  Result Date: 03/09/2022 CLINICAL DATA:  Altered mental status EXAM: CT HEAD WITHOUT CONTRAST TECHNIQUE: Contiguous axial images were obtained from the base of the skull through the vertex without intravenous contrast. RADIATION DOSE REDUCTION: This exam was performed according to the departmental dose-optimization program which includes automated exposure control, adjustment  of the mA and/or kV according to patient size and/or use of iterative reconstruction technique. COMPARISON:  CT Brain 01/26/15 FINDINGS: Brain: Compared to prior exam there is marked interval increase in periventricular and subcortical hypodensity, which is nonspecific, but favored to represent progressive chronic microvascular ischemic change. There is also slight interval increase in size of the bilateral lateral ventricles, favored to be secondary to volume loss. No evidence of hemorrhage, hydrocephalus, extra-axial collection or mass lesion/mass effect. Vascular: Air and cavernous sinus, is likely related to recent intervention. Skull: Normal. Negative for fracture or focal lesion. Sinuses/Orbits: Frothy secretions in the bilateral sphenoid, left maxillary, and left frontal sinus. No mastoid or middle ear effusion. Right lens replacement. Other: None IMPRESSION: 1. Compared to prior exam there is marked interval increase in periventricular and subcortical hypodensity, which is nonspecific, but favored to represent progressive chronic microvascular ischemic change. There is also slight interval increase in size of the bilateral lateral ventricles, favored to be secondary to interval volume loss. No hemorrhage or CT evidence of a new cortical infarct. 2. Frothy secretions in the bilateral sphenoid, left maxillary, and left frontal sinus, which could represent acute sinusitis in the appropriate clinical setting. Electronically Signed   By: Marin Roberts M.D.   On: 03/09/2022 12:32    Orson Eva, DO  [facility]  If 7PM-7AM, please contact night-coverage [email or web address] Password [name] 03/11/2022, 4:36 PM   LOS: 2 days

## 2022-03-11 NOTE — Progress Notes (Signed)
Physical Therapy Treatment Patient Details Name: Bailey Wilson MRN: [id number] DOB: April 27, 1940 Today's Date: 03/11/2022   History of Present Illness 82 year old female with a history of hypertension, paroxysmal atrial fibrillation presenting with generalized weakness, poor oral intake, and confusion for 3 to 4 days.  At baseline, the patient is able to perform her ADLs and drive.  She endorses coughing, chest, and nasal congestion for the past 3 to 4 days.  She has also had some dysuria.  There is no fevers, chills, chest pain, shortness breath, nausea, vomiting, direct abdominal pain.    PT Comments    Patient seated EOB at beginning of session. Patient demonstrates good sitting tolerance and sitting balance while completing exercises. She transfers to standing with RW without assist and ambulates increased distance with RW without loss of balance. Returned to room at end of session. Patient will benefit from continued skilled physical therapy in hospital and recommended venue below to increase strength, balance, endurance for safe ADLs and gait.    Recommendations for follow up therapy are one component of a multi-disciplinary discharge planning process, led by the attending physician.  Recommendations may be updated based on patient status, additional functional criteria and insurance authorization.  Follow Up Recommendations  Home health PT     Assistance Recommended at Discharge PRN  Patient can return home with the following A little help with bathing/dressing/bathroom;Assistance with cooking/housework   Equipment Recommendations  Rolling walker (2 wheels)    Recommendations for Other Services       Precautions / Restrictions Precautions Precautions: None Restrictions Weight Bearing Restrictions: No     Mobility  Bed Mobility               General bed mobility comments: seated EOB at beginning of session    Transfers Overall transfer level: Needs  assistance Equipment used: Rolling walker (2 wheels) Transfers: Sit to/from Stand Sit to Stand: Modified independent (Device/Increase time)                Ambulation/Gait Ambulation/Gait assistance: Supervision Gait Distance (Feet): 100 Feet Assistive device: Rolling walker (2 wheels) Gait Pattern/deviations: Step-through pattern, Decreased stride length Gait velocity: decreased     General Gait Details: ambulates with decreased cadence in room/hallway without loss of balance   Stairs             Wheelchair Mobility    Modified Rankin (Stroke Patients Only)       Balance Overall balance assessment: Needs assistance Sitting-balance support: No upper extremity supported Sitting balance-Leahy Scale: Good   Postural control: Posterior lean Standing balance support: Bilateral upper extremity supported Standing balance-Leahy Scale: Good Standing balance comment: good with RW                            Cognition Arousal/Alertness: Awake/alert Behavior During Therapy: WFL for tasks assessed/performed Overall Cognitive Status: Within Functional Limits for tasks assessed                                          Exercises General Exercises - Lower Extremity Long Arc Quad: AROM, Both, 15 reps, Seated Hip Flexion/Marching: AROM, Both, 15 reps, Seated Toe Raises: AROM, Both, 15 reps, Seated Heel Raises: AROM, Both, 15 reps, Seated    General Comments        Pertinent Vitals/Pain Pain Assessment Pain Assessment: No/denies  pain    Home Living                          Prior Function            PT Goals (current goals can now be found in the care plan section) Acute Rehab PT Goals Patient Stated Goal: " Get home and not use a rolling walker" PT Goal Formulation: With patient Time For Goal Achievement: 03/24/22 Progress towards PT goals: Progressing toward goals    Frequency    Min 2X/week      PT Plan  Current plan remains appropriate    Co-evaluation              AM-PAC PT "6 Clicks" Mobility   Outcome Measure  Help needed turning from your back to your side while in a flat bed without using bedrails?: None Help needed moving from lying on your back to sitting on the side of a flat bed without using bedrails?: A Little Help needed moving to and from a bed to a chair (including a wheelchair)?: None Help needed standing up from a chair using your arms (e.g., wheelchair or bedside chair)?: A Little Help needed to walk in hospital room?: A Little Help needed climbing 3-5 steps with a railing? : A Lot 6 Click Score: 19    End of Session Equipment Utilized During Treatment: Gait belt Activity Tolerance: Patient tolerated treatment well Patient left: in bed;with family/visitor present;with call bell/phone within reach Nurse Communication: Mobility status PT Visit Diagnosis: Unsteadiness on feet (R26.81);Other abnormalities of gait and mobility (R26.89);Muscle weakness (generalized) (M62.81)     Time: 1583-0940 PT Time Calculation (min) (ACUTE ONLY): 11 min  Charges:  $Therapeutic Activity: 8-22 mins                     2:20 PM, 03/11/22 Mearl Latin PT, DPT Physical Therapist at [name]

## 2022-03-11 NOTE — H&P (View-Only) (Signed)
Patient tolerating clear liquid diet. Denies any abdominal pain. She had a BM this morning with very mild toilet tissue hematochezia.  She reports no rectal pain. Baseline Hgb in 11 range. Hgb this morning 10.2 likely in the setting of IV fluids that were running at 75/hr and discontinued this morning after lab draw.   Discussed with patient that colonoscopy still planned for the morning. Offered chance for patient and family to ask questions. No questions at this time. Will plan to start prep around 2 pm. Advised she will continue clear liquids today and be NPO at midnight.  Prep orders have been placed:  At 12:00 pm: 2 bisacodyl tablets (total 10 mg)  At 2:00pm: Start drinking your solution. Try to drink 1 (one) 8 ounce glass every 10-15 minutes, until you have consumed HALF the jug. (You should complete the first 1/2 of the jug in 2 hours. Wait 30 minutes, then drink 3-4 more glasses of the solution. Your stools should be clear; if not, you may have to consume the rest of the jug.   Tomorrow morning she will receive 2 tap water enemas (0600 and 0700).   Please notify Dr. Gala Romney if prep not tolerated and patient not clear.   Venetia Night, MSN, APRN, FNP-BC, AGACNP-BC [facility] at [facility]

## 2022-03-11 NOTE — Progress Notes (Signed)
Patient tolerating clear liquid diet. Denies any abdominal pain. She had a BM this morning with very mild toilet tissue hematochezia.  She reports no rectal pain. Baseline Hgb in 11 range. Hgb this morning 10.2 likely in the setting of IV fluids that were running at 75/hr and discontinued this morning after lab draw.   Discussed with patient that colonoscopy still planned for the morning. Offered chance for patient and family to ask questions. No questions at this time. Will plan to start prep around 2 pm. Advised she will continue clear liquids today and be NPO at midnight.  Prep orders have been placed:  At 12:00 pm: 2 bisacodyl tablets (total 10 mg)  At 2:00pm: Start drinking your solution. Try to drink 1 (one) 8 ounce glass every 10-15 minutes, until you have consumed HALF the jug. (You should complete the first 1/2 of the jug in 2 hours. Wait 30 minutes, then drink 3-4 more glasses of the solution. Your stools should be clear; if not, you may have to consume the rest of the jug.   Tomorrow morning she will receive 2 tap water enemas (0600 and 0700).   Please notify Dr. Gala Romney if prep not tolerated and patient not clear.   Venetia Night, MSN, APRN, FNP-BC, AGACNP-BC [facility] at [facility]

## 2022-03-12 ENCOUNTER — Inpatient Hospital Stay (HOSPITAL_COMMUNITY): Payer: Medicare PPO

## 2022-03-12 ENCOUNTER — Encounter (HOSPITAL_COMMUNITY)
Admission: EM | Disposition: A | Discharge status: Home Health | Payer: Self-pay | Source: Home / Self Care | Attending: Internal Medicine

## 2022-03-12 ENCOUNTER — Inpatient Hospital Stay (HOSPITAL_COMMUNITY): Payer: Medicare PPO | Admitting: Anesthesiology

## 2022-03-12 ENCOUNTER — Encounter (HOSPITAL_COMMUNITY): Payer: Self-pay | Admitting: Internal Medicine

## 2022-03-12 DIAGNOSIS — D121 Benign neoplasm of appendix: Secondary | ICD-10-CM | POA: Diagnosis not present

## 2022-03-12 DIAGNOSIS — D123 Benign neoplasm of transverse colon: Secondary | ICD-10-CM | POA: Diagnosis not present

## 2022-03-12 DIAGNOSIS — K6289 Other specified diseases of anus and rectum: Secondary | ICD-10-CM | POA: Diagnosis not present

## 2022-03-12 DIAGNOSIS — A419 Sepsis, unspecified organism: Secondary | ICD-10-CM | POA: Diagnosis not present

## 2022-03-12 DIAGNOSIS — I4891 Unspecified atrial fibrillation: Secondary | ICD-10-CM | POA: Diagnosis not present

## 2022-03-12 DIAGNOSIS — N179 Acute kidney failure, unspecified: Secondary | ICD-10-CM | POA: Diagnosis not present

## 2022-03-12 DIAGNOSIS — N3000 Acute cystitis without hematuria: Secondary | ICD-10-CM | POA: Diagnosis not present

## 2022-03-12 HISTORY — PX: COLONOSCOPY WITH PROPOFOL: SHX5780

## 2022-03-12 HISTORY — PX: POLYPECTOMY: SHX5525

## 2022-03-12 LAB — CBC
HCT: 35.5 % — ABNORMAL LOW (ref 36.0–46.0)
Hemoglobin: 11.3 g/dL — ABNORMAL LOW (ref 12.0–15.0)
MCH: 26.8 pg (ref 26.0–34.0)
MCHC: 31.8 g/dL (ref 30.0–36.0)
MCV: 84.3 fL (ref 80.0–100.0)
Platelets: 240 10*3/uL (ref 150–400)
RBC: 4.21 MIL/uL (ref 3.87–5.11)
RDW: 13.6 % (ref 11.5–15.5)
WBC: 8.3 10*3/uL (ref 4.0–10.5)
nRBC: 0 % (ref 0.0–0.2)

## 2022-03-12 LAB — COMPREHENSIVE METABOLIC PANEL
ALT: 10 U/L (ref 0–44)
AST: 20 U/L (ref 15–41)
Albumin: 3.1 g/dL — ABNORMAL LOW (ref 3.5–5.0)
Alkaline Phosphatase: 64 U/L (ref 38–126)
Anion gap: 7 (ref 5–15)
BUN: 10 mg/dL (ref 8–23)
CO2: 24 mmol/L (ref 22–32)
Calcium: 9.5 mg/dL (ref 8.9–10.3)
Chloride: 105 mmol/L (ref 98–111)
Creatinine, Ser: 0.73 mg/dL (ref 0.44–1.00)
GFR, Estimated: >60 mL/min (ref >60)
Glucose, Bld: 105 mg/dL — ABNORMAL HIGH (ref 70–99)
Potassium: 3.3 mmol/L — ABNORMAL LOW (ref 3.5–5.1)
Sodium: 136 mmol/L (ref 135–145)
Total Bilirubin: 0.5 mg/dL (ref 0.3–1.2)
Total Protein: 6.7 g/dL (ref 6.5–8.1)

## 2022-03-12 LAB — GLUCOSE, CAPILLARY
Glucose-Capillary: 101 mg/dL — ABNORMAL HIGH (ref 70–99)
Glucose-Capillary: 99 mg/dL (ref 70–99)
Glucose-Capillary: 120 mg/dL — ABNORMAL HIGH (ref 70–99)

## 2022-03-12 LAB — MAGNESIUM: Magnesium: 2 mg/dL (ref 1.7–2.4)

## 2022-03-12 SURGERY — COLONOSCOPY WITH PROPOFOL
Anesthesia: General

## 2022-03-12 MED ORDER — POTASSIUM CHLORIDE CRYS ER 20 MEQ PO TBCR
20.0000 meq | EXTENDED_RELEASE_TABLET | Freq: Once | ORAL | Status: AC
  Administered 2022-03-12: 20 meq via ORAL
  Filled 2022-03-12: qty 1

## 2022-03-12 MED ORDER — IOHEXOL 9 MG/ML PO SOLN
ORAL | Status: AC
  Filled 2022-03-12: qty 1000

## 2022-03-12 MED ORDER — STERILE WATER FOR IRRIGATION IR SOLN
Status: DC | PRN
  Administered 2022-03-12: 50 mL

## 2022-03-12 MED ORDER — PROPOFOL 10 MG/ML IV BOLUS
INTRAVENOUS | Status: DC | PRN
  Administered 2022-03-12: 70 mg via INTRAVENOUS
  Administered 2022-03-12: 10 mg via INTRAVENOUS
  Administered 2022-03-12: 20 mg via INTRAVENOUS

## 2022-03-12 MED ORDER — LIDOCAINE HCL (PF) 2 % IJ SOLN
INTRAMUSCULAR | Status: AC
  Filled 2022-03-12: qty 5

## 2022-03-12 MED ORDER — LIDOCAINE HCL (CARDIAC) PF 100 MG/5ML IV SOSY
PREFILLED_SYRINGE | INTRAVENOUS | Status: DC | PRN
  Administered 2022-03-12: 60 mL via INTRATRACHEAL

## 2022-03-12 MED ORDER — PROPOFOL 10 MG/ML IV BOLUS
INTRAVENOUS | Status: AC
  Filled 2022-03-12: qty 20

## 2022-03-12 MED ORDER — NITROFURANTOIN MONOHYD MACRO 100 MG PO CAPS
100.0000 mg | ORAL_CAPSULE | Freq: Two times a day (BID) | ORAL | Status: DC
  Administered 2022-03-13: 100 mg via ORAL
  Filled 2022-03-12: qty 1

## 2022-03-12 MED ORDER — LACTATED RINGERS IV SOLN: INTRAVENOUS | Status: DC | PRN

## 2022-03-12 MED ORDER — SODIUM CHLORIDE 0.9 % IV SOLN: INTRAVENOUS | Status: DC

## 2022-03-12 MED ORDER — IOHEXOL 9 MG/ML PO SOLN
500.0000 mL | ORAL | Status: AC
  Administered 2022-03-12 (×2): 500 mL via ORAL

## 2022-03-12 MED ORDER — PROPOFOL 500 MG/50ML IV EMUL
INTRAVENOUS | Status: DC | PRN
  Administered 2022-03-12: 200 ug/kg/min via INTRAVENOUS

## 2022-03-12 MED ORDER — IOHEXOL 300 MG/ML  SOLN
100.0000 mL | Freq: Once | INTRAMUSCULAR | Status: AC | PRN
  Administered 2022-03-12: 100 mL via INTRAVENOUS

## 2022-03-12 NOTE — Progress Notes (Signed)
Patient has had enemas per md order.  Patient tolerated procedure well, but was not able to retain enema fluid for a long period of time.

## 2022-03-12 NOTE — Op Note (Signed)
[facility] Patient Name: Bailey Wilson Procedure Date: 03/12/2022 9:20 AM MRN: [id number] Date of Birth: Oct 04, 1940 Attending MD: Norvel Richards , MD, [id number] CSN: [id number] Age: 82 Admit Type: Inpatient Procedure:                Colonoscopy Indications:              Hematochezia Providers:                Norvel Richards, MD, Charlsie Quest. Theda Sers RN, RN,                            Ladoris Gene Technician, Technician Referring MD:              Medicines:                Propofol per Anesthesia Complications:            No immediate complications. Estimated Blood Loss:     Estimated blood loss was minimal. Procedure:                Pre-Anesthesia Assessment:                           - Prior to the procedure, a History and Physical                            was performed, and patient medications and                            allergies were reviewed. The patient's tolerance of                            previous anesthesia was also reviewed. The risks                            and benefits of the procedure and the sedation                            options and risks were discussed with the patient.                            All questions were answered, and informed consent                            was obtained. Prior Anticoagulants: The patient                            last took Eliquis (apixaban) 3 days prior to the                            procedure. ASA Grade Assessment: III - A patient                            with severe systemic disease. After reviewing the  risks and benefits, the patient was deemed in                            satisfactory condition to undergo the procedure.                           After obtaining informed consent, the colonoscope                            was passed under direct vision. Throughout the                            procedure, the patient's blood pressure, pulse, and                             oxygen saturations were monitored continuously. The                            [id number]) scope was introduced through the                            anus and advanced to the the cecum, identified by                            appendiceal orifice and ileocecal valve. The                            colonoscopy was performed without difficulty. The                            patient tolerated the procedure well. The quality                            of the bowel preparation was adequate. The                            ileocecal valve, appendiceal orifice, and rectum                            were photographed. Scope In: 10:03:29 AM Scope Out: 10:22:26 AM Scope Withdrawal Time: 0 hours 13 minutes 57 seconds  Total Procedure Duration: 0 hours 18 minutes 57 seconds  Findings:      The digital rectal exam revealed a 4 x 4 cm ovoid hard mass beginning at       the anal verge. Please see photos. Mass confirmed on endoscopic       evaluation on face and retroflexed.      Two sessile polyps were found in the hepatic flexure and appendiceal       orifice. The polyps were 4 to 6 mm in size. These polyps were removed       with a cold snare. Resection and retrieval were complete. Estimated       blood loss was minimal.      The exam was otherwise without abnormality. Rectal mass biopsied with       jumbo  biopsy forceps. Impression:               - Rectal mass. Biopsied.                           - Two 4 to 6 mm polyps at the hepatic flexure and                            at the appendiceal orifice, removed with a cold                            snare. Resected and retrieved.                           - The examination was otherwise normal. Moderate Sedation:      Moderate (conscious) sedation was personally administered by an       anesthesia professional. The following parameters were monitored: oxygen       saturation, heart rate, blood pressure, respiratory rate, EKG, adequacy        of pulmonary ventilation, and response to care. Recommendation:           - Return patient to hospital ward for ongoing care.                           - Advance diet as tolerated. Recommend serum CEA,                            staging pelvic, abdominal and chest CT scanning.                            Oncology consultation.                           -Further recommendations to follow pending review                            of pathology report. At patient request, I called                            Deberah Pelton, daughter, at [phone]?"informed her                            of today's findings and recommendations. She                            communicated to me that she wanted to get as much                            of the workup done while she was here to expedite                            evaluation and treatment. Procedure Code(s):        --- Professional ---  45385, Colonoscopy, flexible; with removal of                            tumor(s), polyp(s), or other lesion(s) by snare                            technique Diagnosis Code(s):        --- Professional ---                           K62.89, Other specified diseases of anus and rectum                           D12.3, Benign neoplasm of transverse colon (hepatic                            flexure or splenic flexure)                           D12.1, Benign neoplasm of appendix                           K92.1, Melena (includes Hematochezia) CPT copyright 2022 American Medical Association. All rights reserved. The codes documented in this report are preliminary and upon coder review may  be revised to meet current compliance requirements. Cristopher Estimable. Anjelika Ausburn, MD Norvel Richards, MD 03/12/2022 10:46:10 AM This report has been signed electronically. Number of Addenda: 0

## 2022-03-12 NOTE — Progress Notes (Addendum)
PROGRESS NOTE  Bailey Wilson XNT:[id number] DOB: August 10, 1940 DOA: 03/09/2022 PCP: Sharilyn Sites, MD  Brief History:  82 year old female with a history of hypertension, paroxysmal atrial fibrillation presenting with generalized weakness, poor oral intake, and confusion for 3 to 4 days.  At baseline, the patient is able to perform her ADLs and drive.  She endorses coughing, chest, and nasal congestion for the past 3 to 4 days.  She has also had some dysuria.  There is no fevers, chills, chest pain, shortness breath, nausea, vomiting, direct abdominal pain. In the ED, the patient was afebrile and hemodynamically stable with oxygen saturation 98% room air.  WBC 15.1, hemoglobin 13.5, platelets 229,000.  Sodium 136, potassium 3.5, bicarbonate 21, serum creatinine 1.71.  Albumin 4.1, calcium 10.7.  RSV PCR was positive.  CT of the brain was negative, but there was frothy secretions in the bilateral sphenoid, left frontal, and left maxillary sinus.  UA showed >50 WBC.  EKG showed atrial fibrillation with RVR and nonspecific ST changes.  Lactic acid 1.4.  LFTs were unremarkable.  The patient was started on IV fluids and ciprofloxacin.   Assessment/Plan: Acute metabolic encephalopathy -Multifactorial including AKI, hypercalcemia, UTI, RSV bronchitis -CT brain negative -Continue IV fluids -TSH--0.440 -B12--633 -folate--13.5 -continues to improve   Sepsis -presented with tachycardia, leukocytosis and AMS -due to UTI -continue IVF -lactic 1.4 -follow blood and urine culture -sepsis physiology resolved   Hypercalcemia -Albumin 4.1 -Calcium 10.7 -Secondary to HCTZ -Anticipate improvement with IV fluids and holding HCTZ>>improved   UTI--Ecoli -UA> 50 WBC -Continue ciprofloxacin >>nitrofuratoin   AKI -Baseline creatinine 0.9-1.0 -Presented with serum creatinine 1.71 -Secondary to volume depletion -Improving with IV fluids   Hematochezia/Rectal mass -baseline Hgb 11 -GI consult  appreciated -hold apixaban -pt endorses small volume hematochezia x  1 week -03/12/22 colonoscopy--rectal mass confirmed; 2 polyps hepatic flexure removed -CT abd/pelvis/chest ordered for staging -CEA level   Paroxysmal atrial fibrillation with RVR -RVR improved -holding apixaban temporarily -remain on tele -continue metoprolol -rate controlled   RSV bronchitis -Stable on room air -Supportive care   Essential hypertension -Continue amlodipine and metoprolol tartrate -Holding HCTZ   Generalized weakness -B12--633 -TSH--0.440 -PT evaluation>>HHPT -folic 96.7   Hypomagnesemia/hypokalemia -replete           Family Communication:   daughter updated 1/27   Consultants:  GI   Code Status:  FULL    DVT Prophylaxis:  apixaban on hold     Procedures: As Listed in Progress Note Above   Antibiotics: Cipro 1/24>>   Total time spent 50 minutes.  Greater than 50% spent face to face counseling and coordinating care.   Subjective: Patient denies fevers, chills, headache, chest pain, dyspnea, nausea, vomiting, diarrhea, abdominal pain, dysuria, hematuria, hematochezia, and melena.   Objective: Vitals:   03/12/22 1037 03/12/22 1043 03/12/22 1104 03/12/22 1318  BP: 106/67 114/68 135/65 (!) 146/90  Pulse: 77 81 68 70  Resp: '12 14 18 18  '$ Temp:   98.2 F (36.8 C) 98.7 F (37.1 C)  TempSrc:      SpO2: 100% 100% 98% 100%  Weight:      Height:        Intake/Output Summary (Last 24 hours) at 03/12/2022 1802 Last data filed at 03/12/2022 1437 Gross per 24 hour  Intake 680 ml  Output 5 ml  Net 675 ml   Weight change:  Exam:  General:  Pt is alert, follows commands appropriately, not in  acute distress HEENT: No icterus, No thrush, No neck mass, [place]/AT Cardiovascular: IRRR, S1/S2, no rubs, no gallops Respiratory: bibasilar rales. No wheeze Abdomen: Soft/+BS, non tender, non distended, no guarding Extremities: Nonpitting edema, No lymphangitis, No petechiae, No  rashes, no synovitis   Data Reviewed: I have personally reviewed following labs and imaging studies Basic Metabolic Panel: Recent Labs  Lab 03/09/22 1024 03/10/22 0400 03/11/22 0358 03/12/22 0333  NA 139 136 137 136  K 4.1 3.5 3.5 3.3*  CL 105 106 107 105  CO2 22 21* 23 24  GLUCOSE 138* 101* 94 105*  BUN 37* 28* 17 10  CREATININE 1.71* 1.08* 0.90 0.73  CALCIUM 10.7* 9.3 9.3 9.5  MG  --   --  1.5* 2.0   Liver Function Tests: Recent Labs  Lab 03/09/22 1024 03/11/22 0358 03/12/22 0333  AST '21 17 20  '$ ALT '9 8 10  '$ ALKPHOS 75 57 64  BILITOT 1.0 0.5 0.5  PROT 8.7* 6.3* 6.7  ALBUMIN 4.1 3.0* 3.1*   No results for input(s): "LIPASE", "AMYLASE" in the last 168 hours. Recent Labs  Lab 03/10/22 0843  AMMONIA 38*   Coagulation Profile: No results for input(s): "INR", "PROTIME" in the last 168 hours. CBC: Recent Labs  Lab 03/09/22 1016 03/10/22 0400 03/11/22 0358 03/12/22 0333  WBC 15.1* 10.9* 9.8 8.3  HGB 13.5 11.0* 10.2* 11.3*  HCT 41.7 35.0* 32.2* 35.5*  MCV 83.6 84.3 84.3 84.3  PLT 329 237 230 240   Cardiac Enzymes: No results for input(s): "CKTOTAL", "CKMB", "CKMBINDEX", "TROPONINI" in the last 168 hours. BNP: Invalid input(s): "POCBNP" CBG: Recent Labs  Lab 03/09/22 1022 03/11/22 2118 03/12/22 0758 03/12/22 1129 03/12/22 1654  GLUCAP 138* 101* 101* 99 120*   HbA1C: No results for input(s): "HGBA1C" in the last 72 hours. Urine analysis:    Component Value Date/Time   COLORURINE YELLOW 03/09/2022 1440   APPEARANCEUR HAZY (A) 03/09/2022 1440   LABSPEC 1.013 03/09/2022 1440   PHURINE 5.0 03/09/2022 1440   GLUCOSEU NEGATIVE 03/09/2022 1440   HGBUR SMALL (A) 03/09/2022 1440   BILIRUBINUR NEGATIVE 03/09/2022 1440   KETONESUR 20 (A) 03/09/2022 1440   PROTEINUR 30 (A) 03/09/2022 1440   NITRITE POSITIVE (A) 03/09/2022 1440   LEUKOCYTESUR LARGE (A) 03/09/2022 1440   Sepsis Labs: '@LABRCNTIP'$ (procalcitonin:4,lacticidven:4) ) Recent Results (from the  past 240 hour(s))  Resp panel by RT-PCR (RSV, Flu A&B, Covid) Anterior Nasal Swab     Status: Abnormal   Collection Time: 03/09/22 10:16 AM   Specimen: Anterior Nasal Swab  Result Value Ref Range Status   SARS Coronavirus 2 by RT PCR NEGATIVE NEGATIVE Final    Comment: (NOTE) SARS-CoV-2 target nucleic acids are NOT DETECTED.  The SARS-CoV-2 RNA is generally detectable in upper respiratory specimens during the acute phase of infection. The lowest concentration of SARS-CoV-2 viral copies this assay can detect is 138 copies/mL. A negative result does not preclude SARS-Cov-2 infection and should not be used as the sole basis for treatment or other patient management decisions. A negative result may occur with  improper specimen collection/handling, submission of specimen other than nasopharyngeal swab, presence of viral mutation(s) within the areas targeted by this assay, and inadequate number of viral copies(<138 copies/mL). A negative result must be combined with clinical observations, patient history, and epidemiological information. The expected result is Negative.  Fact Sheet for Patients:  EntrepreneurPulse.com.au  Fact Sheet for Healthcare Providers:  IncredibleEmployment.be  This test is no t yet approved or cleared by the  [place] Architectural technologist and  has been authorized for detection and/or diagnosis of SARS-CoV-2 by FDA under an Print production planner (EUA). This EUA will remain  in effect (meaning this test can be used) for the duration of the COVID-19 declaration under Section 564(b)(1) of the Act, 21 U.S.C.section 360bbb-3(b)(1), unless the authorization is terminated  or revoked sooner.       Influenza A by PCR NEGATIVE NEGATIVE Final   Influenza B by PCR NEGATIVE NEGATIVE Final    Comment: (NOTE) The Xpert Xpress SARS-CoV-2/FLU/RSV plus assay is intended as an aid in the diagnosis of influenza from Nasopharyngeal swab specimens  and should not be used as a sole basis for treatment. Nasal washings and aspirates are unacceptable for Xpert Xpress SARS-CoV-2/FLU/RSV testing.  Fact Sheet for Patients: EntrepreneurPulse.com.au  Fact Sheet for Healthcare Providers: IncredibleEmployment.be  This test is not yet approved or cleared by the [place] FDA and has been authorized for detection and/or diagnosis of SARS-CoV-2 by FDA under an Emergency Use Authorization (EUA). This EUA will remain in effect (meaning this test can be used) for the duration of the COVID-19 declaration under Section 564(b)(1) of the Act, 21 U.S.C. section 360bbb-3(b)(1), unless the authorization is terminated or revoked.     Resp Syncytial Virus by PCR POSITIVE (A) NEGATIVE Final    Comment: (NOTE) Fact Sheet for Patients: EntrepreneurPulse.com.au  Fact Sheet for Healthcare Providers: IncredibleEmployment.be  This test is not yet approved or cleared by the [place] FDA and has been authorized for detection and/or diagnosis of SARS-CoV-2 by FDA under an Emergency Use Authorization (EUA). This EUA will remain in effect (meaning this test can be used) for the duration of the COVID-19 declaration under Section 564(b)(1) of the Act, 21 U.S.C. section 360bbb-3(b)(1), unless the authorization is terminated or revoked.  Performed at [name], [address], [place], [place] [address]   Urine Culture (for pregnant, neutropenic or urologic patients or patients with an indwelling urinary catheter)     Status: Abnormal   Collection Time: 03/09/22  2:40 PM   Specimen: Urine, Clean Catch  Result Value Ref Range Status   Specimen Description   Final    URINE, CLEAN CATCH Performed at [facility], [address], [place], [place] [address]    Special Requests   Final    NONE Performed at [facility], [address], [place], [place] [address]    Culture  >=100,000 COLONIES/mL ESCHERICHIA COLI (A)  Final   Report Status 03/11/2022 FINAL  Final   Organism ID, Bacteria ESCHERICHIA COLI (A)  Final      Susceptibility   Escherichia coli - MIC*    AMPICILLIN <=2 SENSITIVE Sensitive     CEFAZOLIN <=4 SENSITIVE Sensitive     CEFEPIME <=0.12 SENSITIVE Sensitive     CEFTRIAXONE <=0.25 SENSITIVE Sensitive     CIPROFLOXACIN <=0.25 SENSITIVE Sensitive     GENTAMICIN <=1 SENSITIVE Sensitive     IMIPENEM <=0.25 SENSITIVE Sensitive     NITROFURANTOIN <=16 SENSITIVE Sensitive     TRIMETH/SULFA <=20 SENSITIVE Sensitive     AMPICILLIN/SULBACTAM <=2 SENSITIVE Sensitive     PIP/TAZO <=4 SENSITIVE Sensitive     * >=100,000 COLONIES/mL ESCHERICHIA COLI  Culture, blood (Routine X 2) w Reflex to ID Panel     Status: None (Preliminary result)   Collection Time: 03/09/22  6:37 PM   Specimen: BLOOD  Result Value Ref Range Status   Specimen Description BLOOD BLOOD LEFT ARM  Final   Special Requests  Final    BOTTLES DRAWN AEROBIC AND ANAEROBIC Blood Culture adequate volume   Culture   Final    NO GROWTH 3 DAYS Performed at [facility], [address], [place], [place] [address]    Report Status PENDING  Incomplete  Culture, blood (Routine X 2) w Reflex to ID Panel     Status: None (Preliminary result)   Collection Time: 03/09/22  6:38 PM   Specimen: BLOOD  Result Value Ref Range Status   Specimen Description BLOOD BLOOD RIGHT ARM  Final   Special Requests   Final    BOTTLES DRAWN AEROBIC AND ANAEROBIC Blood Culture adequate volume   Culture   Final    NO GROWTH 3 DAYS Performed at [name], [address], [place], [place] [address]    Report Status PENDING  Incomplete     Scheduled Meds:  amLODipine  5 mg Oral Daily   metoprolol tartrate  25 mg Oral BID   Continuous Infusions:  ciprofloxacin 400 mg (03/12/22 0824)    Procedures/Studies: CT Head Wo Contrast  Result Date: 03/09/2022 CLINICAL DATA:  Altered mental status EXAM: CT HEAD  WITHOUT CONTRAST TECHNIQUE: Contiguous axial images were obtained from the base of the skull through the vertex without intravenous contrast. RADIATION DOSE REDUCTION: This exam was performed according to the departmental dose-optimization program which includes automated exposure control, adjustment of the mA and/or kV according to patient size and/or use of iterative reconstruction technique. COMPARISON:  CT Brain 01/26/15 FINDINGS: Brain: Compared to prior exam there is marked interval increase in periventricular and subcortical hypodensity, which is nonspecific, but favored to represent progressive chronic microvascular ischemic change. There is also slight interval increase in size of the bilateral lateral ventricles, favored to be secondary to volume loss. No evidence of hemorrhage, hydrocephalus, extra-axial collection or mass lesion/mass effect. Vascular: Air and cavernous sinus, is likely related to recent intervention. Skull: Normal. Negative for fracture or focal lesion. Sinuses/Orbits: Frothy secretions in the bilateral sphenoid, left maxillary, and left frontal sinus. No mastoid or middle ear effusion. Right lens replacement. Other: None IMPRESSION: 1. Compared to prior exam there is marked interval increase in periventricular and subcortical hypodensity, which is nonspecific, but favored to represent progressive chronic microvascular ischemic change. There is also slight interval increase in size of the bilateral lateral ventricles, favored to be secondary to interval volume loss. No hemorrhage or CT evidence of a new cortical infarct. 2. Frothy secretions in the bilateral sphenoid, left maxillary, and left frontal sinus, which could represent acute sinusitis in the appropriate clinical setting. Electronically Signed   By: Marin Roberts M.D.   On: 03/09/2022 12:32    Orson Eva, DO  [facility]  If 7PM-7AM, please contact night-coverage [email or web address] Password [email or web address] 03/12/2022, 6:02 PM    LOS: 3 days

## 2022-03-12 NOTE — Interval H&P Note (Signed)
History and Physical Interval Note:  03/12/2022 9:48 AM  Bailey Wilson  has presented today for surgery, with the diagnosis of rectal mass, rectal bleeding.  The various methods of treatment have been discussed with the patient and family. After consideration of risks, benefits and other options for treatment, the patient has consented to  Procedure(s): COLONOSCOPY WITH PROPOFOL (N/A) as a surgical intervention.  The patient's history has been reviewed, patient examined, no change in status, stable for surgery.  I have reviewed the patient's chart and labs.  Questions were answered to the patient's satisfaction.     Manus Rudd  Patient seen and examined in short stay.  Stable overnight.  Hemoglobin 11.3 this morning.  No Eliquis nearly 3 days. I discussed diagnostic colonoscopy with the patient along with the potential risk benefits limitations and alternatives.  Questions answered she is agreeable.

## 2022-03-12 NOTE — Transfer of Care (Signed)
Immediate Anesthesia Transfer of Care Note  Patient: Bailey Wilson  Procedure(s) Performed: COLONOSCOPY WITH PROPOFOL POLYPECTOMY  Patient Location: PACU  Anesthesia Type:General  Level of Consciousness: sedated, drowsy, and responds to stimulation  Airway & Oxygen Therapy: Patient Spontanous Breathing and Patient connected to nasal cannula oxygen  Post-op Assessment: Report given to RN and Post -op Vital signs reviewed and stable  Post vital signs: Reviewed and stable  Last Vitals:  Vitals Value Taken Time  BP 91/64 03/12/22 1032  Temp 97.4 03/12/22  1032  Pulse 75 03/12/22 1032  Resp 18 03/12/22 1032  SpO2 100 % 03/12/22 1032  Vitals shown include unvalidated device data.  Last Pain:  Vitals:   03/12/22 0958  TempSrc:   PainSc: 0-No pain         Complications: No notable events documented.

## 2022-03-12 NOTE — Anesthesia Postprocedure Evaluation (Signed)
Anesthesia Post Note  Patient: Bailey Wilson  Procedure(s) Performed: COLONOSCOPY WITH PROPOFOL POLYPECTOMY  Patient location during evaluation: PACU Anesthesia Type: General Level of consciousness: awake and alert and oriented Pain management: pain level controlled Vital Signs Assessment: post-procedure vital signs reviewed and stable Respiratory status: spontaneous breathing, nonlabored ventilation and respiratory function stable Cardiovascular status: blood pressure returned to baseline and stable Postop Assessment: no apparent nausea or vomiting Anesthetic complications: no  No notable events documented.   Last Vitals:  Vitals:   03/12/22 1037 03/12/22 1043  BP: 106/67 114/68  Pulse: 77 81  Resp: 12 14  Temp:    SpO2: 100% 100%    Last Pain:  Vitals:   03/12/22 1043  TempSrc:   PainSc: 0-No pain                 Lanny Lipkin C Marion Rosenberry

## 2022-03-13 ENCOUNTER — Other Ambulatory Visit: Payer: Self-pay | Admitting: Cardiology

## 2022-03-13 DIAGNOSIS — K6289 Other specified diseases of anus and rectum: Secondary | ICD-10-CM

## 2022-03-13 DIAGNOSIS — N3000 Acute cystitis without hematuria: Secondary | ICD-10-CM | POA: Diagnosis not present

## 2022-03-13 DIAGNOSIS — N179 Acute kidney failure, unspecified: Secondary | ICD-10-CM | POA: Diagnosis not present

## 2022-03-13 DIAGNOSIS — A4151 Sepsis due to Escherichia coli [E. coli]: Secondary | ICD-10-CM | POA: Diagnosis not present

## 2022-03-13 LAB — BASIC METABOLIC PANEL
Anion gap: 8 (ref 5–15)
BUN: 9 mg/dL (ref 8–23)
CO2: 23 mmol/L (ref 22–32)
Calcium: 9.2 mg/dL (ref 8.9–10.3)
Chloride: 101 mmol/L (ref 98–111)
Creatinine, Ser: 0.86 mg/dL (ref 0.44–1.00)
GFR, Estimated: >60 mL/min (ref >60)
Glucose, Bld: 96 mg/dL (ref 70–99)
Potassium: 3.5 mmol/L (ref 3.5–5.1)
Sodium: 132 mmol/L — ABNORMAL LOW (ref 135–145)

## 2022-03-13 LAB — MAGNESIUM: Magnesium: 1.7 mg/dL (ref 1.7–2.4)

## 2022-03-13 MED ORDER — NITROFURANTOIN MONOHYD MACRO 100 MG PO CAPS: 100.0000 mg | ORAL_CAPSULE | Freq: Two times a day (BID) | ORAL | 0 refills | Status: DC

## 2022-03-13 MED ORDER — MAGNESIUM SULFATE 2 GM/50ML IV SOLN
2.0000 g | Freq: Once | INTRAVENOUS | Status: AC
  Administered 2022-03-13: 2 g via INTRAVENOUS
  Filled 2022-03-13: qty 50

## 2022-03-13 MED ORDER — MAGNESIUM OXIDE -MG SUPPLEMENT 400 (240 MG) MG PO TABS: 400.0000 mg | ORAL_TABLET | Freq: Every day | ORAL | 0 refills | Status: DC

## 2022-03-13 MED ORDER — MAGNESIUM OXIDE -MG SUPPLEMENT 400 (240 MG) MG PO TABS: 400.0000 mg | ORAL_TABLET | Freq: Every day | ORAL | Status: DC

## 2022-03-13 MED ORDER — POTASSIUM CHLORIDE CRYS ER 20 MEQ PO TBCR
20.0000 meq | EXTENDED_RELEASE_TABLET | Freq: Once | ORAL | Status: AC
  Administered 2022-03-13: 20 meq via ORAL
  Filled 2022-03-13: qty 1

## 2022-03-13 NOTE — Progress Notes (Signed)
Patient and family state understanding of discharge instructions 

## 2022-03-13 NOTE — Progress Notes (Signed)
Pt pleasant and cooperative during this writers shift. Has been OOB ambulating with minimal assistance to bathroom. Pt daughter at bedside. Plan of care ongoing.

## 2022-03-13 NOTE — Discharge Summary (Signed)
Physician Discharge Summary   Patient: Bailey Wilson MRN: [id number] DOB: 1941-01-06  Admit date:     03/09/2022  Discharge date: 03/13/22  Discharge Physician: Shanon Brow Ortha Metts   PCP: Sharilyn Sites, MD   Recommendations at discharge:   Please follow up with primary care provider within 1-2 weeks  Please repeat BMP,  mag  and CBC in one week    Discharge Diagnoses:   Hospital Course: 82 year old female with a history of hypertension, paroxysmal atrial fibrillation presenting with generalized weakness, poor oral intake, and confusion for 3 to 4 days.  At baseline, the patient is able to perform her ADLs and drive.  She endorses coughing, chest, and nasal congestion for the past 3 to 4 days.  She has also had some dysuria.  There is no fevers, chills, chest pain, shortness breath, nausea, vomiting, direct abdominal pain. In the ED, the patient was afebrile and hemodynamically stable with oxygen saturation 98% room air.  WBC 15.1, hemoglobin 13.5, platelets 229,000.  Sodium 136, potassium 3.5, bicarbonate 21, serum creatinine 1.71.  Albumin 4.1, calcium 10.7.  RSV PCR was positive.  CT of the brain was negative, but there was frothy secretions in the bilateral sphenoid, left frontal, and left maxillary sinus.  UA showed >50 WBC.  EKG showed atrial fibrillation with RVR and nonspecific ST changes.  Lactic acid 1.4.  LFTs were unremarkable.  The patient was started on IV fluids and ciprofloxacin.  Assessment and Plan: Acute metabolic encephalopathy -Multifactorial including AKI, hypercalcemia, UTI, RSV bronchitis -CT brain negative -Continue IV fluids -TSH--0.440 -B12--633 -folate--13.5 -continues to improve -1/28--back to baseline   Sepsis -presented with tachycardia, leukocytosis and AMS -due to UTI -continued IVF initially -lactic 1.4 -follow blood and urine culture -sepsis physiology resolved   Hypercalcemia -Albumin 4.1 -Calcium 10.7 -Secondary to HCTZ -Anticipate improvement with  IV fluids and holding HCTZ>>improved   UTI--Ecoli -UA> 50 WBC -Continue ciprofloxacin >>nitrofuratoin x 3 more days after d/c   AKI -Baseline creatinine 0.9-1.0 -Presented with serum creatinine 1.71 -Secondary to volume depletion -Improved with IV fluids -serum creatinine 0.86 on day of d/c   Hematochezia/Rectal mass -baseline Hgb 11 -GI consult appreciated -hold apixaban>>restart after d/c -pt endorses small volume hematochezia x  1 week -03/12/22 colonoscopy--rectal mass confirmed; 2 polyps hepatic flexure removed -CT abd/pelvis/chest ordered for staging--Prominent perirectal lymph node measuring 4 mm ;  Scattered bilateral pulmonary nodules measuring up to 5 mm  -CEA level -ambulatory referral made to med/onc--Dr. Delton Coombes  Pulmonary nodules -will need outpt PET   Paroxysmal atrial fibrillation with RVR -RVR improved -holding apixaban temporarily>>restart after dc -remain on tele -continue metoprolol -rate controlled   RSV bronchitis -Stable on room air -Supportive care   Essential hypertension -Continue amlodipine and metoprolol tartrate -Holding HCTZ>>will not restart as BP controlled during hospitalization with above 2 agents   Generalized weakness -B12--633 -TSH--0.440 -PT evaluation>>HHPT -folic 29.5   Hypomagnesemia/hypokalemia -repleted -d/c home with once daily magox         Consultants: GI Procedures performed: colonscopy  Disposition: Home Diet recommendation:  Regular diet DISCHARGE MEDICATION: Allergies as of 03/13/2022       Reactions   Penicillins Hives, Itching   Has patient had a PCN reaction causing immediate rash, facial/tongue/throat swelling, SOB or lightheadedness with hypotension: YES Has patient had a PCN reaction causing severe rash involving mucus membranes or skin necrosis: No Has patient had a PCN reaction that required hospitalization No Has patient had a PCN reaction occurring within the last 10 years:  No If all of  the above answers are "NO", then may proceed with Cephalosporin use.   Ace Inhibitors Cough   Aspirin Other (See Comments)   Heart racing (large doses)   Sulfa Antibiotics Itching, Rash        Medication List     TAKE these medications    albuterol 108 (90 Base) MCG/ACT inhaler Commonly known as: VENTOLIN HFA Inhale 2 puffs into the lungs every 6 (six) hours as needed for wheezing or shortness of breath.   amLODipine 5 MG tablet Commonly known as: NORVASC TAKE 1 TABLET BY MOUTH EVERY DAY IN THE MORNING   ARTIFICIAL TEAR OP Apply 2 drops to eye every morning.   AZO-CRANBERRY PO Take 2 tablets by mouth every morning.   Calcium 600-200 MG-UNIT tablet Take 1 tablet by mouth daily.   calcium carbonate 500 MG chewable tablet Commonly known as: TUMS - dosed in mg elemental calcium Chew 1 tablet by mouth daily as needed for indigestion or heartburn.   cetirizine 10 MG tablet Commonly known as: ZYRTEC Take 10 mg by mouth every morning.   docusate sodium 100 MG capsule Commonly known as: COLACE Take 100 mg by mouth daily as needed for mild constipation.   Eliquis 5 MG Tabs tablet Generic drug: apixaban TAKE 1 TABLET BY MOUTH TWICE A DAY   hydrochlorothiazide 25 MG tablet Commonly known as: HYDRODIURIL Take 25 mg by mouth every morning.   ICAPS MV PO Take 1 capsule by mouth every morning.   magnesium oxide 400 (240 Mg) MG tablet Commonly known as: MAG-OX Take 1 tablet (400 mg total) by mouth daily. Start taking on: March 14, 2022   metoprolol tartrate 25 MG tablet Commonly known as: LOPRESSOR TAKE 1 TABLET BY MOUTH TWICE A DAY   nitrofurantoin (macrocrystal-monohydrate) 100 MG capsule Commonly known as: MACROBID Take 1 capsule (100 mg total) by mouth every 12 (twelve) hours.   sodium chloride 0.65 % Soln nasal spray Commonly known as: OCEAN Place 1 spray into both nostrils as needed for congestion.        Discharge Exam: Filed Weights   03/09/22  1013  Weight: 63.5 kg   HEENT:  [place]/AT, No thrush, no icterus CV:  IRRR, no rub, no S3, no S4 Lung:  bibasilar rales.  No wheeze Abd:  soft/+BS, NT Ext:  Nonpitting edema, no lymphangitis, no synovitis, no rash   Condition at discharge: stable  The results of significant diagnostics from this hospitalization (including imaging, microbiology, ancillary and laboratory) are listed below for reference.   Imaging Studies: CT CHEST ABDOMEN PELVIS W CONTRAST  Result Date: 03/12/2022 CLINICAL DATA:  Rectal cancer staging.  * Tracking Code: BO * EXAM: CT CHEST, ABDOMEN, AND PELVIS WITH CONTRAST TECHNIQUE: Multidetector CT imaging of the chest, abdomen and pelvis was performed following the standard protocol during bolus administration of intravenous contrast. RADIATION DOSE REDUCTION: This exam was performed according to the departmental dose-optimization program which includes automated exposure control, adjustment of the mA and/or kV according to patient size and/or use of iterative reconstruction technique. CONTRAST:  165m OMNIPAQUE IOHEXOL 300 MG/ML  SOLN COMPARISON:  Multiple priors including chest CT March 16, 2010 and CT C-spine January 26, 2015 and thoracic spine radiograph March 19, 2020. FINDINGS: CT CHEST FINDINGS Cardiovascular: Aortic atherosclerosis. Central pulmonary embolus on this nondedicated study. Normal size heart. No significant pericardial effusion/thickening. Retropharyngeal course of the right common iliac artery. Mediastinum/Nodes: No supraclavicular adenopathy. No suspicious thyroid nodule. No pathologically enlarged mediastinal, hilar  or axillary lymph nodes. Mild symmetric distal esophageal wall thickening. Lungs/Pleura: Biapical pleuroparenchymal scarring. Stable 3 mm pulmonary nodule in the left upper lobe on image 24/3 and in the right middle lobe on image 86/3. A few new scattered bilateral pulmonary nodules.  For reference: -Right lower 5 mm ground-glass pulmonary  nodule on image 81/3. -Right upper lobe 4 mm ground-glass pulmonary nodule on image 34/3. Left upper lobe 3 mm ground-glass pulmonary nodule on image 65/3. Diffuse bronchial wall thickening with mosaic attenuation of the lungs. No pleural effusion. No pneumothorax. Musculoskeletal: Severe T11 compression deformity and moderate T4 compression deformities are unchanged. Diffuse demineralization of bone. Sclerotic focus in the C6 vertebral body on image 108/7 is present dating back to cervical spine CT January 26, 2015 compatible with a benign finding. Lucency with narrow transition in the T10 vertebral body on image 94/7 is stable dating back to March 16, 2010 and compatible with a benign hemangioma. No aggressive lytic or blastic lesion of bone. CT ABDOMEN PELVIS FINDINGS Hepatobiliary: No suspicious hepatic lesion. Cholelithiasis without findings of acute cholecystitis. Prominence of the biliary tree with the common duct measuring 8 mm is within normal limits for patient's age. Pancreas: No pancreatic ductal dilation or evidence of acute inflammation. Spleen: No splenomegaly. Adrenals/Urinary Tract: Bilateral adrenal glands appear normal. No hydronephrosis. Bilateral fluid density renal lesions are compatible with cysts and considered benign. Additional bilateral hypodense renal lesions are technically too small to accurately characterize but statistically likely to reflect cysts and considered benign. These benign renal cysts require no independent imaging follow-up. Urinary bladder is unremarkable for degree of distension. Stomach/Bowel: Radiopaque enteric contrast material traverses the ascending colon. Stomach is unremarkable for degree of distension. No pathologic dilation of small or large bowel. The appendix is not confidently identified however there is no pericecal inflammation. No evidence of acute bowel inflammation. Asymmetric rectal wall thickening for instance on image 104/2 likely reflects  patient's known neoplasm. Vascular/Lymphatic: Prominent perirectal lymph node measures 4 mm on image 94/2. No pathologically enlarged abdominal or pelvic lymph nodes. Normal caliber abdominal aorta. Smooth IVC contours. Reproductive: Status post hysterectomy. No adnexal masses. Other: No significant abdominopelvic free fluid. No discrete peritoneal or omental nodularity. Musculoskeletal: No suspicious lytic or blastic lesion of bone. Diffuse demineralization of bone. Multilevel degenerative changes spine. Multilevel Schmorl's node associated endplate irregularities. Right total hip arthroplasty. Chronic osseous irregularity of the pubic symphysis likely reflects sequela of prior trauma. IMPRESSION: 1. Asymmetric rectal wall thickening likely reflects patient's known neoplasm. 2. Prominent perirectal lymph node measuring 4 mm, nonspecific but suspicious for local nodal disease involvement. 3. Scattered bilateral pulmonary nodules measuring up to 5 mm are nonspecific and while favored to reflect an infectious or inflammatory etiology. Metastatic disease is not entirely excluded. Suggest attention on short-term interval follow-up chest CT. 4. No evidence of distant metastatic disease within the abdomen or pelvis. 5. Cholelithiasis without findings of acute cholecystitis. 6. Mild symmetric distal esophageal wall thickening, suggestive of esophagitis. 7.  Aortic Atherosclerosis (ICD10-I70.0). Electronically Signed   By: Dahlia Bailiff M.D.   On: 03/12/2022 18:22   CT Head Wo Contrast  Result Date: 03/09/2022 CLINICAL DATA:  Altered mental status EXAM: CT HEAD WITHOUT CONTRAST TECHNIQUE: Contiguous axial images were obtained from the base of the skull through the vertex without intravenous contrast. RADIATION DOSE REDUCTION: This exam was performed according to the departmental dose-optimization program which includes automated exposure control, adjustment of the mA and/or kV according to patient size and/or use of  iterative reconstruction technique. COMPARISON:  CT Brain 01/26/15 FINDINGS: Brain: Compared to prior exam there is marked interval increase in periventricular and subcortical hypodensity, which is nonspecific, but favored to represent progressive chronic microvascular ischemic change. There is also slight interval increase in size of the bilateral lateral ventricles, favored to be secondary to volume loss. No evidence of hemorrhage, hydrocephalus, extra-axial collection or mass lesion/mass effect. Vascular: Air and cavernous sinus, is likely related to recent intervention. Skull: Normal. Negative for fracture or focal lesion. Sinuses/Orbits: Frothy secretions in the bilateral sphenoid, left maxillary, and left frontal sinus. No mastoid or middle ear effusion. Right lens replacement. Other: None IMPRESSION: 1. Compared to prior exam there is marked interval increase in periventricular and subcortical hypodensity, which is nonspecific, but favored to represent progressive chronic microvascular ischemic change. There is also slight interval increase in size of the bilateral lateral ventricles, favored to be secondary to interval volume loss. No hemorrhage or CT evidence of a new cortical infarct. 2. Frothy secretions in the bilateral sphenoid, left maxillary, and left frontal sinus, which could represent acute sinusitis in the appropriate clinical setting. Electronically Signed   By: Marin Roberts M.D.   On: 03/09/2022 12:32    Microbiology: Results for orders placed or performed during the hospital encounter of 03/09/22  Resp panel by RT-PCR (RSV, Flu A&B, Covid) Anterior Nasal Swab     Status: Abnormal   Collection Time: 03/09/22 10:16 AM   Specimen: Anterior Nasal Swab  Result Value Ref Range Status   SARS Coronavirus 2 by RT PCR NEGATIVE NEGATIVE Final    Comment: (NOTE) SARS-CoV-2 target nucleic acids are NOT DETECTED.  The SARS-CoV-2 RNA is generally detectable in upper respiratory specimens during  the acute phase of infection. The lowest concentration of SARS-CoV-2 viral copies this assay can detect is 138 copies/mL. A negative result does not preclude SARS-Cov-2 infection and should not be used as the sole basis for treatment or other patient management decisions. A negative result may occur with  improper specimen collection/handling, submission of specimen other than nasopharyngeal swab, presence of viral mutation(s) within the areas targeted by this assay, and inadequate number of viral copies(<138 copies/mL). A negative result must be combined with clinical observations, patient history, and epidemiological information. The expected result is Negative.  Fact Sheet for Patients:  EntrepreneurPulse.com.au  Fact Sheet for Healthcare Providers:  IncredibleEmployment.be  This test is no t yet approved or cleared by the [place] FDA and  has been authorized for detection and/or diagnosis of SARS-CoV-2 by FDA under an Emergency Use Authorization (EUA). This EUA will remain  in effect (meaning this test can be used) for the duration of the COVID-19 declaration under Section 564(b)(1) of the Act, 21 U.S.C.section 360bbb-3(b)(1), unless the authorization is terminated  or revoked sooner.       Influenza A by PCR NEGATIVE NEGATIVE Final   Influenza B by PCR NEGATIVE NEGATIVE Final    Comment: (NOTE) The Xpert Xpress SARS-CoV-2/FLU/RSV plus assay is intended as an aid in the diagnosis of influenza from Nasopharyngeal swab specimens and should not be used as a sole basis for treatment. Nasal washings and aspirates are unacceptable for Xpert Xpress SARS-CoV-2/FLU/RSV testing.  Fact Sheet for Patients: EntrepreneurPulse.com.au  Fact Sheet for Healthcare Providers: IncredibleEmployment.be  This test is not yet approved or cleared by the [place] FDA and has been authorized for detection and/or  diagnosis of SARS-CoV-2 by FDA under an Emergency Use Authorization (EUA). This EUA will remain in effect (  meaning this test can be used) for the duration of the COVID-19 declaration under Section 564(b)(1) of the Act, 21 U.S.C. section 360bbb-3(b)(1), unless the authorization is terminated or revoked.     Resp Syncytial Virus by PCR POSITIVE (A) NEGATIVE Final    Comment: (NOTE) Fact Sheet for Patients: EntrepreneurPulse.com.au  Fact Sheet for Healthcare Providers: IncredibleEmployment.be  This test is not yet approved or cleared by the [place] FDA and has been authorized for detection and/or diagnosis of SARS-CoV-2 by FDA under an Emergency Use Authorization (EUA). This EUA will remain in effect (meaning this test can be used) for the duration of the COVID-19 declaration under Section 564(b)(1) of the Act, 21 U.S.C. section 360bbb-3(b)(1), unless the authorization is terminated or revoked.  Performed at [facility], [address], [place], [place] [address]   Urine Culture (for pregnant, neutropenic or urologic patients or patients with an indwelling urinary catheter)     Status: Abnormal   Collection Time: 03/09/22  2:40 PM   Specimen: Urine, Clean Catch  Result Value Ref Range Status   Specimen Description   Final    URINE, CLEAN CATCH Performed at [facility], [address], [place], [place] [address]    Special Requests   Final    NONE Performed at [facility], [address] [place], [place] [address]    Culture >=100,000 COLONIES/mL ESCHERICHIA COLI (A)  Final   Report Status 03/11/2022 FINAL  Final   Organism ID, Bacteria ESCHERICHIA COLI (A)  Final      Susceptibility   Escherichia coli - MIC*    AMPICILLIN <=2 SENSITIVE Sensitive     CEFAZOLIN <=4 SENSITIVE Sensitive     CEFEPIME <=0.12 SENSITIVE Sensitive     CEFTRIAXONE <=0.25 SENSITIVE Sensitive     CIPROFLOXACIN <=0.25 SENSITIVE Sensitive     GENTAMICIN <=1  SENSITIVE Sensitive     IMIPENEM <=0.25 SENSITIVE Sensitive     NITROFURANTOIN <=16 SENSITIVE Sensitive     TRIMETH/SULFA <=20 SENSITIVE Sensitive     AMPICILLIN/SULBACTAM <=2 SENSITIVE Sensitive     PIP/TAZO <=4 SENSITIVE Sensitive     * >=100,000 COLONIES/mL ESCHERICHIA COLI  Culture, blood (Routine X 2) w Reflex to ID Panel     Status: None (Preliminary result)   Collection Time: 03/09/22  6:37 PM   Specimen: BLOOD  Result Value Ref Range Status   Specimen Description BLOOD BLOOD LEFT ARM  Final   Special Requests   Final    BOTTLES DRAWN AEROBIC AND ANAEROBIC Blood Culture adequate volume   Culture   Final    NO GROWTH 4 DAYS Performed at [facility], [address] [place], [place] [address]    Report Status PENDING  Incomplete  Culture, blood (Routine X 2) w Reflex to ID Panel     Status: None (Preliminary result)   Collection Time: 03/09/22  6:38 PM   Specimen: BLOOD  Result Value Ref Range Status   Specimen Description BLOOD BLOOD RIGHT ARM  Final   Special Requests   Final    BOTTLES DRAWN AEROBIC AND ANAEROBIC Blood Culture adequate volume   Culture   Final    NO GROWTH 4 DAYS Performed at [facility], [address] [place],  [address]    Report Status PENDING  Incomplete    Labs: CBC: Recent Labs  Lab 03/09/22 1016 03/10/22 0400 03/11/22 0358 03/12/22 0333  WBC 15.1* 10.9* 9.8 8.3  HGB 13.5 11.0* 10.2* 11.3*  HCT 41.7 35.0* 32.2* 35.5*  MCV 83.6 84.3 84.3 84.3  PLT 329 237 230 295   Basic Metabolic Panel: Recent Labs  Lab 03/09/22 1024 03/10/22 0400 03/11/22 0358 03/12/22 0333 03/13/22 0341  NA 139 136 137 136 132*  K 4.1 3.5 3.5 3.3* 3.5  CL 105 106 107 105 101  CO2 22 21* '23 24 23  '$ GLUCOSE 138* 101* 94 105* 96  BUN 37* 28* '17 10 9  '$ CREATININE 1.71* 1.08* 0.90 0.73 0.86  CALCIUM 10.7* 9.3 9.3 9.5 9.2  MG  --   --  1.5* 2.0 1.7   Liver Function Tests: Recent Labs  Lab 03/09/22 1024 03/11/22 0358 03/12/22 0333  AST '21 17 20   '$ ALT '9 8 10  '$ ALKPHOS 75 57 64  BILITOT 1.0 0.5 0.5  PROT 8.7* 6.3* 6.7  ALBUMIN 4.1 3.0* 3.1*   CBG: Recent Labs  Lab 03/09/22 1022 03/11/22 2118 03/12/22 0758 03/12/22 1129 03/12/22 1654  GLUCAP 138* 101* 101* 99 120*    Discharge time spent: greater than 30 minutes.  Signed: Orson Eva, MD [facility] 03/13/2022

## 2022-03-14 ENCOUNTER — Other Ambulatory Visit: Payer: Self-pay

## 2022-03-14 DIAGNOSIS — K6289 Other specified diseases of anus and rectum: Secondary | ICD-10-CM

## 2022-03-14 LAB — CULTURE, BLOOD (ROUTINE X 2)
Culture: NO GROWTH
Culture: NO GROWTH
Special Requests: ADEQUATE
Special Requests: ADEQUATE

## 2022-03-14 NOTE — Telephone Encounter (Addendum)
Prescription refill request for Eliquis received. Indication:  AF Last office visit: 12/04/20  Myles Gip MD Scr: 0.86 on 02/11/23 Age: 82 Weight: 74.8kg  Based on above findings Eliquis '5mg'$  twice daily is the appropriate dose.  Refill approved.  Pt is past due for appt with Dr Domenic Polite.  Message sent to schedulers.

## 2022-03-14 NOTE — Progress Notes (Signed)
Orders placed for MRI Rectal Cancer Staging and PET scan per Dr. Delton Coombes

## 2022-03-15 DIAGNOSIS — R1900 Intra-abdominal and pelvic swelling, mass and lump, unspecified site: Secondary | ICD-10-CM | POA: Diagnosis not present

## 2022-03-15 LAB — CEA: CEA: 2.7 ng/mL (ref 0.0–4.7)

## 2022-03-18 ENCOUNTER — Encounter (HOSPITAL_COMMUNITY): Payer: Self-pay | Admitting: Internal Medicine

## 2022-03-21 ENCOUNTER — Other Ambulatory Visit: Payer: Self-pay | Admitting: *Deleted

## 2022-03-21 ENCOUNTER — Telehealth: Payer: Self-pay | Admitting: *Deleted

## 2022-03-21 DIAGNOSIS — K6289 Other specified diseases of anus and rectum: Secondary | ICD-10-CM

## 2022-03-21 NOTE — Telephone Encounter (Signed)
noted 

## 2022-03-21 NOTE — Telephone Encounter (Signed)
Received call from Dr. Marthann Schiller office stating that pt was referred to them while she was inpatient. Pt refused the referral because [place]  FYI

## 2022-03-28 DIAGNOSIS — C20 Malignant neoplasm of rectum: Secondary | ICD-10-CM | POA: Diagnosis not present

## 2022-03-28 DIAGNOSIS — C210 Malignant neoplasm of anus, unspecified: Secondary | ICD-10-CM | POA: Diagnosis not present

## 2022-03-29 DIAGNOSIS — I10 Essential (primary) hypertension: Secondary | ICD-10-CM | POA: Diagnosis not present

## 2022-03-29 DIAGNOSIS — E860 Dehydration: Secondary | ICD-10-CM | POA: Diagnosis not present

## 2022-03-29 DIAGNOSIS — I480 Paroxysmal atrial fibrillation: Secondary | ICD-10-CM | POA: Diagnosis not present

## 2022-03-29 DIAGNOSIS — C210 Malignant neoplasm of anus, unspecified: Secondary | ICD-10-CM | POA: Diagnosis not present

## 2022-04-01 DIAGNOSIS — I10 Essential (primary) hypertension: Secondary | ICD-10-CM | POA: Diagnosis not present

## 2022-04-01 DIAGNOSIS — Z85820 Personal history of malignant melanoma of skin: Secondary | ICD-10-CM | POA: Diagnosis not present

## 2022-04-01 DIAGNOSIS — C20 Malignant neoplasm of rectum: Secondary | ICD-10-CM | POA: Diagnosis not present

## 2022-04-01 DIAGNOSIS — I4891 Unspecified atrial fibrillation: Secondary | ICD-10-CM | POA: Diagnosis not present

## 2022-04-01 DIAGNOSIS — D500 Iron deficiency anemia secondary to blood loss (chronic): Secondary | ICD-10-CM | POA: Diagnosis not present

## 2022-04-01 DIAGNOSIS — C210 Malignant neoplasm of anus, unspecified: Secondary | ICD-10-CM | POA: Diagnosis not present

## 2022-04-01 DIAGNOSIS — Z79630 Long term (current) use of alkylating agent: Secondary | ICD-10-CM | POA: Diagnosis not present

## 2022-04-01 DIAGNOSIS — I482 Chronic atrial fibrillation, unspecified: Secondary | ICD-10-CM | POA: Diagnosis not present

## 2022-04-01 DIAGNOSIS — Z510 Encounter for antineoplastic radiation therapy: Secondary | ICD-10-CM | POA: Diagnosis not present

## 2022-04-01 LAB — SURGICAL PATHOLOGY

## 2022-04-05 DIAGNOSIS — Z79630 Long term (current) use of alkylating agent: Secondary | ICD-10-CM | POA: Diagnosis not present

## 2022-04-05 DIAGNOSIS — C210 Malignant neoplasm of anus, unspecified: Secondary | ICD-10-CM | POA: Diagnosis not present

## 2022-04-05 DIAGNOSIS — Z510 Encounter for antineoplastic radiation therapy: Secondary | ICD-10-CM | POA: Diagnosis not present

## 2022-04-05 DIAGNOSIS — Z85820 Personal history of malignant melanoma of skin: Secondary | ICD-10-CM | POA: Diagnosis not present

## 2022-04-06 DIAGNOSIS — C210 Malignant neoplasm of anus, unspecified: Secondary | ICD-10-CM | POA: Diagnosis not present

## 2022-04-06 DIAGNOSIS — Z510 Encounter for antineoplastic radiation therapy: Secondary | ICD-10-CM | POA: Diagnosis not present

## 2022-04-06 DIAGNOSIS — Z85820 Personal history of malignant melanoma of skin: Secondary | ICD-10-CM | POA: Diagnosis not present

## 2022-04-06 DIAGNOSIS — Z79630 Long term (current) use of alkylating agent: Secondary | ICD-10-CM | POA: Diagnosis not present

## 2022-04-07 ENCOUNTER — Ambulatory Visit: Payer: Medicare PPO | Admitting: Cardiology

## 2022-04-07 ENCOUNTER — Other Ambulatory Visit: Payer: Self-pay | Admitting: Cardiology

## 2022-04-07 DIAGNOSIS — I482 Chronic atrial fibrillation, unspecified: Secondary | ICD-10-CM | POA: Diagnosis not present

## 2022-04-07 DIAGNOSIS — Z1272 Encounter for screening for malignant neoplasm of vagina: Secondary | ICD-10-CM | POA: Diagnosis not present

## 2022-04-07 DIAGNOSIS — R54 Age-related physical debility: Secondary | ICD-10-CM | POA: Diagnosis not present

## 2022-04-07 DIAGNOSIS — I10 Essential (primary) hypertension: Secondary | ICD-10-CM | POA: Diagnosis not present

## 2022-04-07 DIAGNOSIS — C4490 Unspecified malignant neoplasm of skin, unspecified: Secondary | ICD-10-CM | POA: Diagnosis not present

## 2022-04-07 DIAGNOSIS — C20 Malignant neoplasm of rectum: Secondary | ICD-10-CM | POA: Diagnosis not present

## 2022-04-07 DIAGNOSIS — C211 Malignant neoplasm of anal canal: Secondary | ICD-10-CM | POA: Diagnosis not present

## 2022-04-07 DIAGNOSIS — Z85820 Personal history of malignant melanoma of skin: Secondary | ICD-10-CM | POA: Diagnosis not present

## 2022-04-07 DIAGNOSIS — K6289 Other specified diseases of anus and rectum: Secondary | ICD-10-CM | POA: Diagnosis not present

## 2022-04-07 DIAGNOSIS — D500 Iron deficiency anemia secondary to blood loss (chronic): Secondary | ICD-10-CM | POA: Diagnosis not present

## 2022-04-07 DIAGNOSIS — I4891 Unspecified atrial fibrillation: Secondary | ICD-10-CM | POA: Diagnosis not present

## 2022-04-07 DIAGNOSIS — C210 Malignant neoplasm of anus, unspecified: Secondary | ICD-10-CM | POA: Diagnosis not present

## 2022-04-11 DIAGNOSIS — I4891 Unspecified atrial fibrillation: Secondary | ICD-10-CM | POA: Diagnosis not present

## 2022-04-11 DIAGNOSIS — R0609 Other forms of dyspnea: Secondary | ICD-10-CM | POA: Diagnosis not present

## 2022-04-11 DIAGNOSIS — I517 Cardiomegaly: Secondary | ICD-10-CM | POA: Diagnosis not present

## 2022-04-11 DIAGNOSIS — I351 Nonrheumatic aortic (valve) insufficiency: Secondary | ICD-10-CM | POA: Diagnosis not present

## 2022-04-11 DIAGNOSIS — C210 Malignant neoplasm of anus, unspecified: Secondary | ICD-10-CM | POA: Diagnosis not present

## 2022-04-11 DIAGNOSIS — I371 Nonrheumatic pulmonary valve insufficiency: Secondary | ICD-10-CM | POA: Diagnosis not present

## 2022-04-11 DIAGNOSIS — I340 Nonrheumatic mitral (valve) insufficiency: Secondary | ICD-10-CM | POA: Diagnosis not present

## 2022-04-11 DIAGNOSIS — I10 Essential (primary) hypertension: Secondary | ICD-10-CM | POA: Diagnosis not present

## 2022-04-11 DIAGNOSIS — I3481 Nonrheumatic mitral (valve) annulus calcification: Secondary | ICD-10-CM | POA: Diagnosis not present

## 2022-04-12 DIAGNOSIS — I083 Combined rheumatic disorders of mitral, aortic and tricuspid valves: Secondary | ICD-10-CM | POA: Diagnosis not present

## 2022-04-12 DIAGNOSIS — I4821 Permanent atrial fibrillation: Secondary | ICD-10-CM | POA: Diagnosis not present

## 2022-04-12 DIAGNOSIS — C210 Malignant neoplasm of anus, unspecified: Secondary | ICD-10-CM | POA: Diagnosis not present

## 2022-04-12 DIAGNOSIS — J45909 Unspecified asthma, uncomplicated: Secondary | ICD-10-CM | POA: Diagnosis not present

## 2022-04-12 DIAGNOSIS — Z7901 Long term (current) use of anticoagulants: Secondary | ICD-10-CM | POA: Diagnosis not present

## 2022-04-12 DIAGNOSIS — I4891 Unspecified atrial fibrillation: Secondary | ICD-10-CM | POA: Diagnosis not present

## 2022-04-12 DIAGNOSIS — I10 Essential (primary) hypertension: Secondary | ICD-10-CM | POA: Diagnosis not present

## 2022-04-12 DIAGNOSIS — M1990 Unspecified osteoarthritis, unspecified site: Secondary | ICD-10-CM | POA: Diagnosis not present

## 2022-04-14 DIAGNOSIS — I083 Combined rheumatic disorders of mitral, aortic and tricuspid valves: Secondary | ICD-10-CM | POA: Diagnosis not present

## 2022-04-14 DIAGNOSIS — I4891 Unspecified atrial fibrillation: Secondary | ICD-10-CM | POA: Diagnosis not present

## 2022-04-14 DIAGNOSIS — I10 Essential (primary) hypertension: Secondary | ICD-10-CM | POA: Diagnosis not present

## 2022-04-14 DIAGNOSIS — C210 Malignant neoplasm of anus, unspecified: Secondary | ICD-10-CM | POA: Diagnosis not present

## 2022-04-14 DIAGNOSIS — R0609 Other forms of dyspnea: Secondary | ICD-10-CM | POA: Diagnosis not present

## 2022-04-16 ENCOUNTER — Other Ambulatory Visit: Payer: Self-pay | Admitting: Cardiology

## 2022-04-18 DIAGNOSIS — C210 Malignant neoplasm of anus, unspecified: Secondary | ICD-10-CM | POA: Diagnosis not present

## 2022-04-18 DIAGNOSIS — Z79899 Other long term (current) drug therapy: Secondary | ICD-10-CM | POA: Diagnosis not present

## 2022-04-18 DIAGNOSIS — Z85820 Personal history of malignant melanoma of skin: Secondary | ICD-10-CM | POA: Diagnosis not present

## 2022-04-18 DIAGNOSIS — Z79630 Long term (current) use of alkylating agent: Secondary | ICD-10-CM | POA: Diagnosis not present

## 2022-04-18 DIAGNOSIS — L989 Disorder of the skin and subcutaneous tissue, unspecified: Secondary | ICD-10-CM | POA: Diagnosis not present

## 2022-04-18 DIAGNOSIS — Z5111 Encounter for antineoplastic chemotherapy: Secondary | ICD-10-CM | POA: Diagnosis not present

## 2022-04-18 DIAGNOSIS — Z510 Encounter for antineoplastic radiation therapy: Secondary | ICD-10-CM | POA: Diagnosis not present

## 2022-04-19 DIAGNOSIS — Z79630 Long term (current) use of alkylating agent: Secondary | ICD-10-CM | POA: Diagnosis not present

## 2022-04-19 DIAGNOSIS — Z510 Encounter for antineoplastic radiation therapy: Secondary | ICD-10-CM | POA: Diagnosis not present

## 2022-04-19 DIAGNOSIS — C210 Malignant neoplasm of anus, unspecified: Secondary | ICD-10-CM | POA: Diagnosis not present

## 2022-04-19 DIAGNOSIS — Z85820 Personal history of malignant melanoma of skin: Secondary | ICD-10-CM | POA: Diagnosis not present

## 2022-04-20 DIAGNOSIS — C210 Malignant neoplasm of anus, unspecified: Secondary | ICD-10-CM | POA: Diagnosis not present

## 2022-04-20 DIAGNOSIS — Z79630 Long term (current) use of alkylating agent: Secondary | ICD-10-CM | POA: Diagnosis not present

## 2022-04-20 DIAGNOSIS — Z85820 Personal history of malignant melanoma of skin: Secondary | ICD-10-CM | POA: Diagnosis not present

## 2022-04-20 DIAGNOSIS — Z510 Encounter for antineoplastic radiation therapy: Secondary | ICD-10-CM | POA: Diagnosis not present

## 2022-04-21 DIAGNOSIS — C210 Malignant neoplasm of anus, unspecified: Secondary | ICD-10-CM | POA: Diagnosis not present

## 2022-04-21 DIAGNOSIS — Z85820 Personal history of malignant melanoma of skin: Secondary | ICD-10-CM | POA: Diagnosis not present

## 2022-04-21 DIAGNOSIS — Z79630 Long term (current) use of alkylating agent: Secondary | ICD-10-CM | POA: Diagnosis not present

## 2022-04-21 DIAGNOSIS — Z510 Encounter for antineoplastic radiation therapy: Secondary | ICD-10-CM | POA: Diagnosis not present

## 2022-04-22 DIAGNOSIS — Z85820 Personal history of malignant melanoma of skin: Secondary | ICD-10-CM | POA: Diagnosis not present

## 2022-04-22 DIAGNOSIS — L989 Disorder of the skin and subcutaneous tissue, unspecified: Secondary | ICD-10-CM | POA: Diagnosis not present

## 2022-04-22 DIAGNOSIS — Z510 Encounter for antineoplastic radiation therapy: Secondary | ICD-10-CM | POA: Diagnosis not present

## 2022-04-22 DIAGNOSIS — Z5111 Encounter for antineoplastic chemotherapy: Secondary | ICD-10-CM | POA: Diagnosis not present

## 2022-04-22 DIAGNOSIS — C210 Malignant neoplasm of anus, unspecified: Secondary | ICD-10-CM | POA: Diagnosis not present

## 2022-04-22 DIAGNOSIS — Z79899 Other long term (current) drug therapy: Secondary | ICD-10-CM | POA: Diagnosis not present

## 2022-04-22 DIAGNOSIS — Z79630 Long term (current) use of alkylating agent: Secondary | ICD-10-CM | POA: Diagnosis not present

## 2022-04-25 DIAGNOSIS — I482 Chronic atrial fibrillation, unspecified: Secondary | ICD-10-CM | POA: Diagnosis not present

## 2022-04-25 DIAGNOSIS — Z85820 Personal history of malignant melanoma of skin: Secondary | ICD-10-CM | POA: Diagnosis not present

## 2022-04-25 DIAGNOSIS — I10 Essential (primary) hypertension: Secondary | ICD-10-CM | POA: Diagnosis not present

## 2022-04-25 DIAGNOSIS — D500 Iron deficiency anemia secondary to blood loss (chronic): Secondary | ICD-10-CM | POA: Diagnosis not present

## 2022-04-25 DIAGNOSIS — L989 Disorder of the skin and subcutaneous tissue, unspecified: Secondary | ICD-10-CM | POA: Diagnosis not present

## 2022-04-25 DIAGNOSIS — Z510 Encounter for antineoplastic radiation therapy: Secondary | ICD-10-CM | POA: Diagnosis not present

## 2022-04-25 DIAGNOSIS — C210 Malignant neoplasm of anus, unspecified: Secondary | ICD-10-CM | POA: Diagnosis not present

## 2022-04-25 DIAGNOSIS — Z79630 Long term (current) use of alkylating agent: Secondary | ICD-10-CM | POA: Diagnosis not present

## 2022-04-25 DIAGNOSIS — E611 Iron deficiency: Secondary | ICD-10-CM | POA: Diagnosis not present

## 2022-04-25 DIAGNOSIS — Z7901 Long term (current) use of anticoagulants: Secondary | ICD-10-CM | POA: Diagnosis not present

## 2022-04-26 DIAGNOSIS — Z85820 Personal history of malignant melanoma of skin: Secondary | ICD-10-CM | POA: Diagnosis not present

## 2022-04-26 DIAGNOSIS — Z79630 Long term (current) use of alkylating agent: Secondary | ICD-10-CM | POA: Diagnosis not present

## 2022-04-26 DIAGNOSIS — Z510 Encounter for antineoplastic radiation therapy: Secondary | ICD-10-CM | POA: Diagnosis not present

## 2022-04-26 DIAGNOSIS — C210 Malignant neoplasm of anus, unspecified: Secondary | ICD-10-CM | POA: Diagnosis not present

## 2022-04-27 DIAGNOSIS — Z510 Encounter for antineoplastic radiation therapy: Secondary | ICD-10-CM | POA: Diagnosis not present

## 2022-04-27 DIAGNOSIS — Z85820 Personal history of malignant melanoma of skin: Secondary | ICD-10-CM | POA: Diagnosis not present

## 2022-04-27 DIAGNOSIS — Z79630 Long term (current) use of alkylating agent: Secondary | ICD-10-CM | POA: Diagnosis not present

## 2022-04-27 DIAGNOSIS — C210 Malignant neoplasm of anus, unspecified: Secondary | ICD-10-CM | POA: Diagnosis not present

## 2022-04-28 DIAGNOSIS — Z85820 Personal history of malignant melanoma of skin: Secondary | ICD-10-CM | POA: Diagnosis not present

## 2022-04-28 DIAGNOSIS — Z510 Encounter for antineoplastic radiation therapy: Secondary | ICD-10-CM | POA: Diagnosis not present

## 2022-04-28 DIAGNOSIS — Z79630 Long term (current) use of alkylating agent: Secondary | ICD-10-CM | POA: Diagnosis not present

## 2022-04-28 DIAGNOSIS — C210 Malignant neoplasm of anus, unspecified: Secondary | ICD-10-CM | POA: Diagnosis not present

## 2022-04-29 DIAGNOSIS — Z85820 Personal history of malignant melanoma of skin: Secondary | ICD-10-CM | POA: Diagnosis not present

## 2022-04-29 DIAGNOSIS — C210 Malignant neoplasm of anus, unspecified: Secondary | ICD-10-CM | POA: Diagnosis not present

## 2022-04-29 DIAGNOSIS — Z79630 Long term (current) use of alkylating agent: Secondary | ICD-10-CM | POA: Diagnosis not present

## 2022-04-29 DIAGNOSIS — Z510 Encounter for antineoplastic radiation therapy: Secondary | ICD-10-CM | POA: Diagnosis not present

## 2022-05-02 DIAGNOSIS — T451X5A Adverse effect of antineoplastic and immunosuppressive drugs, initial encounter: Secondary | ICD-10-CM | POA: Diagnosis not present

## 2022-05-02 DIAGNOSIS — E611 Iron deficiency: Secondary | ICD-10-CM | POA: Diagnosis not present

## 2022-05-02 DIAGNOSIS — D500 Iron deficiency anemia secondary to blood loss (chronic): Secondary | ICD-10-CM | POA: Diagnosis not present

## 2022-05-02 DIAGNOSIS — L989 Disorder of the skin and subcutaneous tissue, unspecified: Secondary | ICD-10-CM | POA: Diagnosis not present

## 2022-05-02 DIAGNOSIS — I482 Chronic atrial fibrillation, unspecified: Secondary | ICD-10-CM | POA: Diagnosis not present

## 2022-05-02 DIAGNOSIS — E860 Dehydration: Secondary | ICD-10-CM | POA: Diagnosis not present

## 2022-05-02 DIAGNOSIS — Z510 Encounter for antineoplastic radiation therapy: Secondary | ICD-10-CM | POA: Diagnosis not present

## 2022-05-02 DIAGNOSIS — I4891 Unspecified atrial fibrillation: Secondary | ICD-10-CM | POA: Diagnosis not present

## 2022-05-02 DIAGNOSIS — I10 Essential (primary) hypertension: Secondary | ICD-10-CM | POA: Diagnosis not present

## 2022-05-02 DIAGNOSIS — Z79630 Long term (current) use of alkylating agent: Secondary | ICD-10-CM | POA: Diagnosis not present

## 2022-05-02 DIAGNOSIS — C211 Malignant neoplasm of anal canal: Secondary | ICD-10-CM | POA: Diagnosis not present

## 2022-05-02 DIAGNOSIS — K521 Toxic gastroenteritis and colitis: Secondary | ICD-10-CM | POA: Diagnosis not present

## 2022-05-02 DIAGNOSIS — K625 Hemorrhage of anus and rectum: Secondary | ICD-10-CM | POA: Diagnosis not present

## 2022-05-02 DIAGNOSIS — C210 Malignant neoplasm of anus, unspecified: Secondary | ICD-10-CM | POA: Diagnosis not present

## 2022-05-02 DIAGNOSIS — Z85820 Personal history of malignant melanoma of skin: Secondary | ICD-10-CM | POA: Diagnosis not present

## 2022-05-03 DIAGNOSIS — Z79630 Long term (current) use of alkylating agent: Secondary | ICD-10-CM | POA: Diagnosis not present

## 2022-05-03 DIAGNOSIS — C210 Malignant neoplasm of anus, unspecified: Secondary | ICD-10-CM | POA: Diagnosis not present

## 2022-05-03 DIAGNOSIS — Z510 Encounter for antineoplastic radiation therapy: Secondary | ICD-10-CM | POA: Diagnosis not present

## 2022-05-03 DIAGNOSIS — Z85820 Personal history of malignant melanoma of skin: Secondary | ICD-10-CM | POA: Diagnosis not present

## 2022-05-04 DIAGNOSIS — Z510 Encounter for antineoplastic radiation therapy: Secondary | ICD-10-CM | POA: Diagnosis not present

## 2022-05-04 DIAGNOSIS — Z85820 Personal history of malignant melanoma of skin: Secondary | ICD-10-CM | POA: Diagnosis not present

## 2022-05-04 DIAGNOSIS — C210 Malignant neoplasm of anus, unspecified: Secondary | ICD-10-CM | POA: Diagnosis not present

## 2022-05-04 DIAGNOSIS — Z79630 Long term (current) use of alkylating agent: Secondary | ICD-10-CM | POA: Diagnosis not present

## 2022-05-05 DIAGNOSIS — Z85820 Personal history of malignant melanoma of skin: Secondary | ICD-10-CM | POA: Diagnosis not present

## 2022-05-05 DIAGNOSIS — Z79630 Long term (current) use of alkylating agent: Secondary | ICD-10-CM | POA: Diagnosis not present

## 2022-05-05 DIAGNOSIS — Z510 Encounter for antineoplastic radiation therapy: Secondary | ICD-10-CM | POA: Diagnosis not present

## 2022-05-05 DIAGNOSIS — C210 Malignant neoplasm of anus, unspecified: Secondary | ICD-10-CM | POA: Diagnosis not present

## 2022-05-06 DIAGNOSIS — Z510 Encounter for antineoplastic radiation therapy: Secondary | ICD-10-CM | POA: Diagnosis not present

## 2022-05-06 DIAGNOSIS — Z85820 Personal history of malignant melanoma of skin: Secondary | ICD-10-CM | POA: Diagnosis not present

## 2022-05-06 DIAGNOSIS — Z79630 Long term (current) use of alkylating agent: Secondary | ICD-10-CM | POA: Diagnosis not present

## 2022-05-06 DIAGNOSIS — C210 Malignant neoplasm of anus, unspecified: Secondary | ICD-10-CM | POA: Diagnosis not present

## 2022-05-09 DIAGNOSIS — E860 Dehydration: Secondary | ICD-10-CM | POA: Diagnosis not present

## 2022-05-09 DIAGNOSIS — E611 Iron deficiency: Secondary | ICD-10-CM | POA: Diagnosis not present

## 2022-05-09 DIAGNOSIS — Z85820 Personal history of malignant melanoma of skin: Secondary | ICD-10-CM | POA: Diagnosis not present

## 2022-05-09 DIAGNOSIS — Z5111 Encounter for antineoplastic chemotherapy: Secondary | ICD-10-CM | POA: Diagnosis not present

## 2022-05-09 DIAGNOSIS — C210 Malignant neoplasm of anus, unspecified: Secondary | ICD-10-CM | POA: Diagnosis not present

## 2022-05-09 DIAGNOSIS — K625 Hemorrhage of anus and rectum: Secondary | ICD-10-CM | POA: Diagnosis not present

## 2022-05-09 DIAGNOSIS — Z79630 Long term (current) use of alkylating agent: Secondary | ICD-10-CM | POA: Diagnosis not present

## 2022-05-09 DIAGNOSIS — Z510 Encounter for antineoplastic radiation therapy: Secondary | ICD-10-CM | POA: Diagnosis not present

## 2022-05-10 DIAGNOSIS — Z510 Encounter for antineoplastic radiation therapy: Secondary | ICD-10-CM | POA: Diagnosis not present

## 2022-05-10 DIAGNOSIS — C210 Malignant neoplasm of anus, unspecified: Secondary | ICD-10-CM | POA: Diagnosis not present

## 2022-05-10 DIAGNOSIS — Z79630 Long term (current) use of alkylating agent: Secondary | ICD-10-CM | POA: Diagnosis not present

## 2022-05-10 DIAGNOSIS — Z85820 Personal history of malignant melanoma of skin: Secondary | ICD-10-CM | POA: Diagnosis not present

## 2022-05-11 DIAGNOSIS — Z510 Encounter for antineoplastic radiation therapy: Secondary | ICD-10-CM | POA: Diagnosis not present

## 2022-05-11 DIAGNOSIS — C210 Malignant neoplasm of anus, unspecified: Secondary | ICD-10-CM | POA: Diagnosis not present

## 2022-05-11 DIAGNOSIS — Z85820 Personal history of malignant melanoma of skin: Secondary | ICD-10-CM | POA: Diagnosis not present

## 2022-05-11 DIAGNOSIS — Z79630 Long term (current) use of alkylating agent: Secondary | ICD-10-CM | POA: Diagnosis not present

## 2022-05-12 DIAGNOSIS — C210 Malignant neoplasm of anus, unspecified: Secondary | ICD-10-CM | POA: Diagnosis not present

## 2022-05-12 DIAGNOSIS — Z85820 Personal history of malignant melanoma of skin: Secondary | ICD-10-CM | POA: Diagnosis not present

## 2022-05-12 DIAGNOSIS — Z510 Encounter for antineoplastic radiation therapy: Secondary | ICD-10-CM | POA: Diagnosis not present

## 2022-05-12 DIAGNOSIS — Z79630 Long term (current) use of alkylating agent: Secondary | ICD-10-CM | POA: Diagnosis not present

## 2022-05-13 DIAGNOSIS — Z510 Encounter for antineoplastic radiation therapy: Secondary | ICD-10-CM | POA: Diagnosis not present

## 2022-05-13 DIAGNOSIS — C210 Malignant neoplasm of anus, unspecified: Secondary | ICD-10-CM | POA: Diagnosis not present

## 2022-05-16 DIAGNOSIS — Z7901 Long term (current) use of anticoagulants: Secondary | ICD-10-CM | POA: Diagnosis not present

## 2022-05-16 DIAGNOSIS — L989 Disorder of the skin and subcutaneous tissue, unspecified: Secondary | ICD-10-CM | POA: Diagnosis not present

## 2022-05-16 DIAGNOSIS — Z85820 Personal history of malignant melanoma of skin: Secondary | ICD-10-CM | POA: Diagnosis not present

## 2022-05-16 DIAGNOSIS — Z5111 Encounter for antineoplastic chemotherapy: Secondary | ICD-10-CM | POA: Diagnosis not present

## 2022-05-16 DIAGNOSIS — D500 Iron deficiency anemia secondary to blood loss (chronic): Secondary | ICD-10-CM | POA: Diagnosis not present

## 2022-05-16 DIAGNOSIS — Z79899 Other long term (current) drug therapy: Secondary | ICD-10-CM | POA: Diagnosis not present

## 2022-05-16 DIAGNOSIS — K521 Toxic gastroenteritis and colitis: Secondary | ICD-10-CM | POA: Diagnosis not present

## 2022-05-16 DIAGNOSIS — Z79630 Long term (current) use of alkylating agent: Secondary | ICD-10-CM | POA: Diagnosis not present

## 2022-05-16 DIAGNOSIS — C210 Malignant neoplasm of anus, unspecified: Secondary | ICD-10-CM | POA: Diagnosis not present

## 2022-05-16 DIAGNOSIS — E611 Iron deficiency: Secondary | ICD-10-CM | POA: Diagnosis not present

## 2022-05-16 DIAGNOSIS — Z510 Encounter for antineoplastic radiation therapy: Secondary | ICD-10-CM | POA: Diagnosis not present

## 2022-05-16 DIAGNOSIS — I482 Chronic atrial fibrillation, unspecified: Secondary | ICD-10-CM | POA: Diagnosis not present

## 2022-05-16 DIAGNOSIS — T451X5A Adverse effect of antineoplastic and immunosuppressive drugs, initial encounter: Secondary | ICD-10-CM | POA: Diagnosis not present

## 2022-05-17 DIAGNOSIS — Z510 Encounter for antineoplastic radiation therapy: Secondary | ICD-10-CM | POA: Diagnosis not present

## 2022-05-17 DIAGNOSIS — C210 Malignant neoplasm of anus, unspecified: Secondary | ICD-10-CM | POA: Diagnosis not present

## 2022-05-17 DIAGNOSIS — Z79630 Long term (current) use of alkylating agent: Secondary | ICD-10-CM | POA: Diagnosis not present

## 2022-05-17 DIAGNOSIS — Z85820 Personal history of malignant melanoma of skin: Secondary | ICD-10-CM | POA: Diagnosis not present

## 2022-05-18 DIAGNOSIS — Z85820 Personal history of malignant melanoma of skin: Secondary | ICD-10-CM | POA: Diagnosis not present

## 2022-05-18 DIAGNOSIS — Z510 Encounter for antineoplastic radiation therapy: Secondary | ICD-10-CM | POA: Diagnosis not present

## 2022-05-18 DIAGNOSIS — Z79630 Long term (current) use of alkylating agent: Secondary | ICD-10-CM | POA: Diagnosis not present

## 2022-05-18 DIAGNOSIS — C210 Malignant neoplasm of anus, unspecified: Secondary | ICD-10-CM | POA: Diagnosis not present

## 2022-05-19 DIAGNOSIS — Z510 Encounter for antineoplastic radiation therapy: Secondary | ICD-10-CM | POA: Diagnosis not present

## 2022-05-19 DIAGNOSIS — Z79630 Long term (current) use of alkylating agent: Secondary | ICD-10-CM | POA: Diagnosis not present

## 2022-05-19 DIAGNOSIS — Z85820 Personal history of malignant melanoma of skin: Secondary | ICD-10-CM | POA: Diagnosis not present

## 2022-05-19 DIAGNOSIS — C210 Malignant neoplasm of anus, unspecified: Secondary | ICD-10-CM | POA: Diagnosis not present

## 2022-05-20 DIAGNOSIS — Z79630 Long term (current) use of alkylating agent: Secondary | ICD-10-CM | POA: Diagnosis not present

## 2022-05-20 DIAGNOSIS — Z85820 Personal history of malignant melanoma of skin: Secondary | ICD-10-CM | POA: Diagnosis not present

## 2022-05-20 DIAGNOSIS — Z510 Encounter for antineoplastic radiation therapy: Secondary | ICD-10-CM | POA: Diagnosis not present

## 2022-05-20 DIAGNOSIS — C210 Malignant neoplasm of anus, unspecified: Secondary | ICD-10-CM | POA: Diagnosis not present

## 2022-05-23 DIAGNOSIS — L578 Other skin changes due to chronic exposure to nonionizing radiation: Secondary | ICD-10-CM | POA: Diagnosis not present

## 2022-05-23 DIAGNOSIS — Z85820 Personal history of malignant melanoma of skin: Secondary | ICD-10-CM | POA: Diagnosis not present

## 2022-05-23 DIAGNOSIS — L570 Actinic keratosis: Secondary | ICD-10-CM | POA: Diagnosis not present

## 2022-05-23 DIAGNOSIS — C210 Malignant neoplasm of anus, unspecified: Secondary | ICD-10-CM | POA: Diagnosis not present

## 2022-05-23 DIAGNOSIS — D0461 Carcinoma in situ of skin of right upper limb, including shoulder: Secondary | ICD-10-CM | POA: Diagnosis not present

## 2022-05-23 DIAGNOSIS — D485 Neoplasm of uncertain behavior of skin: Secondary | ICD-10-CM | POA: Diagnosis not present

## 2022-05-23 DIAGNOSIS — Z510 Encounter for antineoplastic radiation therapy: Secondary | ICD-10-CM | POA: Diagnosis not present

## 2022-05-23 DIAGNOSIS — Z79630 Long term (current) use of alkylating agent: Secondary | ICD-10-CM | POA: Diagnosis not present

## 2022-05-24 DIAGNOSIS — Z79630 Long term (current) use of alkylating agent: Secondary | ICD-10-CM | POA: Diagnosis not present

## 2022-05-24 DIAGNOSIS — Z510 Encounter for antineoplastic radiation therapy: Secondary | ICD-10-CM | POA: Diagnosis not present

## 2022-05-24 DIAGNOSIS — Z85820 Personal history of malignant melanoma of skin: Secondary | ICD-10-CM | POA: Diagnosis not present

## 2022-05-24 DIAGNOSIS — C210 Malignant neoplasm of anus, unspecified: Secondary | ICD-10-CM | POA: Diagnosis not present

## 2022-05-25 DIAGNOSIS — Z510 Encounter for antineoplastic radiation therapy: Secondary | ICD-10-CM | POA: Diagnosis not present

## 2022-05-25 DIAGNOSIS — Z85820 Personal history of malignant melanoma of skin: Secondary | ICD-10-CM | POA: Diagnosis not present

## 2022-05-25 DIAGNOSIS — C210 Malignant neoplasm of anus, unspecified: Secondary | ICD-10-CM | POA: Diagnosis not present

## 2022-05-25 DIAGNOSIS — Z79630 Long term (current) use of alkylating agent: Secondary | ICD-10-CM | POA: Diagnosis not present

## 2022-05-26 DIAGNOSIS — Z79630 Long term (current) use of alkylating agent: Secondary | ICD-10-CM | POA: Diagnosis not present

## 2022-05-26 DIAGNOSIS — Z85820 Personal history of malignant melanoma of skin: Secondary | ICD-10-CM | POA: Diagnosis not present

## 2022-05-26 DIAGNOSIS — Z510 Encounter for antineoplastic radiation therapy: Secondary | ICD-10-CM | POA: Diagnosis not present

## 2022-05-26 DIAGNOSIS — C210 Malignant neoplasm of anus, unspecified: Secondary | ICD-10-CM | POA: Diagnosis not present

## 2022-05-27 DIAGNOSIS — Z85820 Personal history of malignant melanoma of skin: Secondary | ICD-10-CM | POA: Diagnosis not present

## 2022-05-27 DIAGNOSIS — Z79630 Long term (current) use of alkylating agent: Secondary | ICD-10-CM | POA: Diagnosis not present

## 2022-05-27 DIAGNOSIS — C210 Malignant neoplasm of anus, unspecified: Secondary | ICD-10-CM | POA: Diagnosis not present

## 2022-05-27 DIAGNOSIS — Z510 Encounter for antineoplastic radiation therapy: Secondary | ICD-10-CM | POA: Diagnosis not present

## 2022-05-30 DIAGNOSIS — C210 Malignant neoplasm of anus, unspecified: Secondary | ICD-10-CM | POA: Diagnosis not present

## 2022-05-30 DIAGNOSIS — Z85820 Personal history of malignant melanoma of skin: Secondary | ICD-10-CM | POA: Diagnosis not present

## 2022-05-30 DIAGNOSIS — Z79630 Long term (current) use of alkylating agent: Secondary | ICD-10-CM | POA: Diagnosis not present

## 2022-05-30 DIAGNOSIS — E611 Iron deficiency: Secondary | ICD-10-CM | POA: Diagnosis not present

## 2022-05-30 DIAGNOSIS — Z510 Encounter for antineoplastic radiation therapy: Secondary | ICD-10-CM | POA: Diagnosis not present

## 2022-06-06 DIAGNOSIS — C210 Malignant neoplasm of anus, unspecified: Secondary | ICD-10-CM | POA: Diagnosis not present

## 2022-06-06 DIAGNOSIS — Z79899 Other long term (current) drug therapy: Secondary | ICD-10-CM | POA: Diagnosis not present

## 2022-06-06 DIAGNOSIS — E611 Iron deficiency: Secondary | ICD-10-CM | POA: Diagnosis not present

## 2022-06-13 DIAGNOSIS — C4490 Unspecified malignant neoplasm of skin, unspecified: Secondary | ICD-10-CM | POA: Diagnosis not present

## 2022-06-13 DIAGNOSIS — D61818 Other pancytopenia: Secondary | ICD-10-CM | POA: Diagnosis not present

## 2022-06-13 DIAGNOSIS — C210 Malignant neoplasm of anus, unspecified: Secondary | ICD-10-CM | POA: Diagnosis not present

## 2022-06-13 DIAGNOSIS — E611 Iron deficiency: Secondary | ICD-10-CM | POA: Diagnosis not present

## 2022-06-27 DIAGNOSIS — L570 Actinic keratosis: Secondary | ICD-10-CM | POA: Diagnosis not present

## 2022-06-27 DIAGNOSIS — D0461 Carcinoma in situ of skin of right upper limb, including shoulder: Secondary | ICD-10-CM | POA: Diagnosis not present

## 2022-07-07 DIAGNOSIS — C210 Malignant neoplasm of anus, unspecified: Secondary | ICD-10-CM | POA: Diagnosis not present

## 2022-07-07 DIAGNOSIS — I4891 Unspecified atrial fibrillation: Secondary | ICD-10-CM | POA: Diagnosis not present

## 2022-07-07 DIAGNOSIS — D638 Anemia in other chronic diseases classified elsewhere: Secondary | ICD-10-CM | POA: Diagnosis not present

## 2022-07-07 DIAGNOSIS — E7800 Pure hypercholesterolemia, unspecified: Secondary | ICD-10-CM | POA: Diagnosis not present

## 2022-07-07 DIAGNOSIS — I10 Essential (primary) hypertension: Secondary | ICD-10-CM | POA: Diagnosis not present

## 2022-07-08 DIAGNOSIS — Z85048 Personal history of other malignant neoplasm of rectum, rectosigmoid junction, and anus: Secondary | ICD-10-CM | POA: Diagnosis not present

## 2022-07-08 DIAGNOSIS — I10 Essential (primary) hypertension: Secondary | ICD-10-CM | POA: Diagnosis not present

## 2022-07-08 DIAGNOSIS — Z888 Allergy status to other drugs, medicaments and biological substances status: Secondary | ICD-10-CM | POA: Diagnosis not present

## 2022-07-08 DIAGNOSIS — R410 Disorientation, unspecified: Secondary | ICD-10-CM | POA: Diagnosis not present

## 2022-07-08 DIAGNOSIS — R531 Weakness: Secondary | ICD-10-CM | POA: Diagnosis not present

## 2022-07-08 DIAGNOSIS — Z880 Allergy status to penicillin: Secondary | ICD-10-CM | POA: Diagnosis not present

## 2022-07-08 DIAGNOSIS — Z882 Allergy status to sulfonamides status: Secondary | ICD-10-CM | POA: Diagnosis not present

## 2022-07-08 DIAGNOSIS — E785 Hyperlipidemia, unspecified: Secondary | ICD-10-CM | POA: Diagnosis not present

## 2022-07-08 DIAGNOSIS — S0003XA Contusion of scalp, initial encounter: Secondary | ICD-10-CM | POA: Diagnosis not present

## 2022-07-08 DIAGNOSIS — Z8679 Personal history of other diseases of the circulatory system: Secondary | ICD-10-CM | POA: Diagnosis not present

## 2022-07-09 DIAGNOSIS — S0003XA Contusion of scalp, initial encounter: Secondary | ICD-10-CM | POA: Diagnosis not present

## 2022-07-09 DIAGNOSIS — R410 Disorientation, unspecified: Secondary | ICD-10-CM | POA: Diagnosis not present

## 2022-07-13 DIAGNOSIS — D61818 Other pancytopenia: Secondary | ICD-10-CM | POA: Diagnosis not present

## 2022-07-13 DIAGNOSIS — C210 Malignant neoplasm of anus, unspecified: Secondary | ICD-10-CM | POA: Diagnosis not present

## 2022-07-15 DIAGNOSIS — E7800 Pure hypercholesterolemia, unspecified: Secondary | ICD-10-CM | POA: Diagnosis not present

## 2022-07-15 DIAGNOSIS — I10 Essential (primary) hypertension: Secondary | ICD-10-CM | POA: Diagnosis not present

## 2022-07-15 DIAGNOSIS — C210 Malignant neoplasm of anus, unspecified: Secondary | ICD-10-CM | POA: Diagnosis not present

## 2022-07-15 DIAGNOSIS — D638 Anemia in other chronic diseases classified elsewhere: Secondary | ICD-10-CM | POA: Diagnosis not present

## 2022-07-15 DIAGNOSIS — I4891 Unspecified atrial fibrillation: Secondary | ICD-10-CM | POA: Diagnosis not present

## 2022-08-01 DIAGNOSIS — C210 Malignant neoplasm of anus, unspecified: Secondary | ICD-10-CM | POA: Diagnosis not present

## 2022-08-17 DIAGNOSIS — E7800 Pure hypercholesterolemia, unspecified: Secondary | ICD-10-CM | POA: Diagnosis not present

## 2022-08-17 DIAGNOSIS — I10 Essential (primary) hypertension: Secondary | ICD-10-CM | POA: Diagnosis not present

## 2022-08-17 DIAGNOSIS — Z7901 Long term (current) use of anticoagulants: Secondary | ICD-10-CM | POA: Diagnosis not present

## 2022-08-17 DIAGNOSIS — D638 Anemia in other chronic diseases classified elsewhere: Secondary | ICD-10-CM | POA: Diagnosis not present

## 2022-08-17 DIAGNOSIS — Z9181 History of falling: Secondary | ICD-10-CM | POA: Diagnosis not present

## 2022-08-17 DIAGNOSIS — Z556 Problems related to health literacy: Secondary | ICD-10-CM | POA: Diagnosis not present

## 2022-08-17 DIAGNOSIS — I4891 Unspecified atrial fibrillation: Secondary | ICD-10-CM | POA: Diagnosis not present

## 2022-08-17 DIAGNOSIS — C210 Malignant neoplasm of anus, unspecified: Secondary | ICD-10-CM | POA: Diagnosis not present

## 2022-08-18 DIAGNOSIS — D638 Anemia in other chronic diseases classified elsewhere: Secondary | ICD-10-CM | POA: Diagnosis not present

## 2022-08-18 DIAGNOSIS — I4891 Unspecified atrial fibrillation: Secondary | ICD-10-CM | POA: Diagnosis not present

## 2022-08-18 DIAGNOSIS — E7800 Pure hypercholesterolemia, unspecified: Secondary | ICD-10-CM | POA: Diagnosis not present

## 2022-08-18 DIAGNOSIS — Z9181 History of falling: Secondary | ICD-10-CM | POA: Diagnosis not present

## 2022-08-18 DIAGNOSIS — Z556 Problems related to health literacy: Secondary | ICD-10-CM | POA: Diagnosis not present

## 2022-08-18 DIAGNOSIS — Z7901 Long term (current) use of anticoagulants: Secondary | ICD-10-CM | POA: Diagnosis not present

## 2022-08-18 DIAGNOSIS — I10 Essential (primary) hypertension: Secondary | ICD-10-CM | POA: Diagnosis not present

## 2022-08-18 DIAGNOSIS — C210 Malignant neoplasm of anus, unspecified: Secondary | ICD-10-CM | POA: Diagnosis not present

## 2022-08-21 DIAGNOSIS — Z9181 History of falling: Secondary | ICD-10-CM | POA: Diagnosis not present

## 2022-08-21 DIAGNOSIS — E7800 Pure hypercholesterolemia, unspecified: Secondary | ICD-10-CM | POA: Diagnosis not present

## 2022-08-21 DIAGNOSIS — C210 Malignant neoplasm of anus, unspecified: Secondary | ICD-10-CM | POA: Diagnosis not present

## 2022-08-21 DIAGNOSIS — Z7901 Long term (current) use of anticoagulants: Secondary | ICD-10-CM | POA: Diagnosis not present

## 2022-08-21 DIAGNOSIS — Z556 Problems related to health literacy: Secondary | ICD-10-CM | POA: Diagnosis not present

## 2022-08-21 DIAGNOSIS — I4891 Unspecified atrial fibrillation: Secondary | ICD-10-CM | POA: Diagnosis not present

## 2022-08-21 DIAGNOSIS — I10 Essential (primary) hypertension: Secondary | ICD-10-CM | POA: Diagnosis not present

## 2022-08-21 DIAGNOSIS — D638 Anemia in other chronic diseases classified elsewhere: Secondary | ICD-10-CM | POA: Diagnosis not present

## 2022-08-23 DIAGNOSIS — Z9181 History of falling: Secondary | ICD-10-CM | POA: Diagnosis not present

## 2022-08-23 DIAGNOSIS — Z7901 Long term (current) use of anticoagulants: Secondary | ICD-10-CM | POA: Diagnosis not present

## 2022-08-23 DIAGNOSIS — Z556 Problems related to health literacy: Secondary | ICD-10-CM | POA: Diagnosis not present

## 2022-08-23 DIAGNOSIS — E7800 Pure hypercholesterolemia, unspecified: Secondary | ICD-10-CM | POA: Diagnosis not present

## 2022-08-23 DIAGNOSIS — D638 Anemia in other chronic diseases classified elsewhere: Secondary | ICD-10-CM | POA: Diagnosis not present

## 2022-08-23 DIAGNOSIS — I10 Essential (primary) hypertension: Secondary | ICD-10-CM | POA: Diagnosis not present

## 2022-08-23 DIAGNOSIS — I4891 Unspecified atrial fibrillation: Secondary | ICD-10-CM | POA: Diagnosis not present

## 2022-08-23 DIAGNOSIS — C210 Malignant neoplasm of anus, unspecified: Secondary | ICD-10-CM | POA: Diagnosis not present

## 2022-08-25 DIAGNOSIS — E7800 Pure hypercholesterolemia, unspecified: Secondary | ICD-10-CM | POA: Diagnosis not present

## 2022-08-25 DIAGNOSIS — Z556 Problems related to health literacy: Secondary | ICD-10-CM | POA: Diagnosis not present

## 2022-08-25 DIAGNOSIS — Z9181 History of falling: Secondary | ICD-10-CM | POA: Diagnosis not present

## 2022-08-25 DIAGNOSIS — I10 Essential (primary) hypertension: Secondary | ICD-10-CM | POA: Diagnosis not present

## 2022-08-25 DIAGNOSIS — C210 Malignant neoplasm of anus, unspecified: Secondary | ICD-10-CM | POA: Diagnosis not present

## 2022-08-25 DIAGNOSIS — D638 Anemia in other chronic diseases classified elsewhere: Secondary | ICD-10-CM | POA: Diagnosis not present

## 2022-08-25 DIAGNOSIS — I4891 Unspecified atrial fibrillation: Secondary | ICD-10-CM | POA: Diagnosis not present

## 2022-08-25 DIAGNOSIS — Z7901 Long term (current) use of anticoagulants: Secondary | ICD-10-CM | POA: Diagnosis not present

## 2022-08-29 DIAGNOSIS — Z556 Problems related to health literacy: Secondary | ICD-10-CM | POA: Diagnosis not present

## 2022-08-29 DIAGNOSIS — I4891 Unspecified atrial fibrillation: Secondary | ICD-10-CM | POA: Diagnosis not present

## 2022-08-29 DIAGNOSIS — D638 Anemia in other chronic diseases classified elsewhere: Secondary | ICD-10-CM | POA: Diagnosis not present

## 2022-08-29 DIAGNOSIS — Z9181 History of falling: Secondary | ICD-10-CM | POA: Diagnosis not present

## 2022-08-29 DIAGNOSIS — E7800 Pure hypercholesterolemia, unspecified: Secondary | ICD-10-CM | POA: Diagnosis not present

## 2022-08-29 DIAGNOSIS — Z7901 Long term (current) use of anticoagulants: Secondary | ICD-10-CM | POA: Diagnosis not present

## 2022-08-29 DIAGNOSIS — I10 Essential (primary) hypertension: Secondary | ICD-10-CM | POA: Diagnosis not present

## 2022-08-29 DIAGNOSIS — C210 Malignant neoplasm of anus, unspecified: Secondary | ICD-10-CM | POA: Diagnosis not present

## 2022-08-30 DIAGNOSIS — C210 Malignant neoplasm of anus, unspecified: Secondary | ICD-10-CM | POA: Diagnosis not present

## 2022-08-30 DIAGNOSIS — Z556 Problems related to health literacy: Secondary | ICD-10-CM | POA: Diagnosis not present

## 2022-08-30 DIAGNOSIS — D638 Anemia in other chronic diseases classified elsewhere: Secondary | ICD-10-CM | POA: Diagnosis not present

## 2022-08-30 DIAGNOSIS — Z7901 Long term (current) use of anticoagulants: Secondary | ICD-10-CM | POA: Diagnosis not present

## 2022-08-30 DIAGNOSIS — I10 Essential (primary) hypertension: Secondary | ICD-10-CM | POA: Diagnosis not present

## 2022-08-30 DIAGNOSIS — E7800 Pure hypercholesterolemia, unspecified: Secondary | ICD-10-CM | POA: Diagnosis not present

## 2022-08-30 DIAGNOSIS — Z9181 History of falling: Secondary | ICD-10-CM | POA: Diagnosis not present

## 2022-08-30 DIAGNOSIS — I4891 Unspecified atrial fibrillation: Secondary | ICD-10-CM | POA: Diagnosis not present

## 2022-09-01 DIAGNOSIS — C210 Malignant neoplasm of anus, unspecified: Secondary | ICD-10-CM | POA: Diagnosis not present

## 2022-09-01 DIAGNOSIS — D638 Anemia in other chronic diseases classified elsewhere: Secondary | ICD-10-CM | POA: Diagnosis not present

## 2022-09-01 DIAGNOSIS — I10 Essential (primary) hypertension: Secondary | ICD-10-CM | POA: Diagnosis not present

## 2022-09-01 DIAGNOSIS — Z556 Problems related to health literacy: Secondary | ICD-10-CM | POA: Diagnosis not present

## 2022-09-01 DIAGNOSIS — I4891 Unspecified atrial fibrillation: Secondary | ICD-10-CM | POA: Diagnosis not present

## 2022-09-01 DIAGNOSIS — E7800 Pure hypercholesterolemia, unspecified: Secondary | ICD-10-CM | POA: Diagnosis not present

## 2022-09-01 DIAGNOSIS — Z7901 Long term (current) use of anticoagulants: Secondary | ICD-10-CM | POA: Diagnosis not present

## 2022-09-01 DIAGNOSIS — Z9181 History of falling: Secondary | ICD-10-CM | POA: Diagnosis not present

## 2022-09-05 DIAGNOSIS — D638 Anemia in other chronic diseases classified elsewhere: Secondary | ICD-10-CM | POA: Diagnosis not present

## 2022-09-05 DIAGNOSIS — C210 Malignant neoplasm of anus, unspecified: Secondary | ICD-10-CM | POA: Diagnosis not present

## 2022-09-05 DIAGNOSIS — Z556 Problems related to health literacy: Secondary | ICD-10-CM | POA: Diagnosis not present

## 2022-09-05 DIAGNOSIS — E7800 Pure hypercholesterolemia, unspecified: Secondary | ICD-10-CM | POA: Diagnosis not present

## 2022-09-05 DIAGNOSIS — I10 Essential (primary) hypertension: Secondary | ICD-10-CM | POA: Diagnosis not present

## 2022-09-05 DIAGNOSIS — Z7901 Long term (current) use of anticoagulants: Secondary | ICD-10-CM | POA: Diagnosis not present

## 2022-09-05 DIAGNOSIS — Z9181 History of falling: Secondary | ICD-10-CM | POA: Diagnosis not present

## 2022-09-05 DIAGNOSIS — I4891 Unspecified atrial fibrillation: Secondary | ICD-10-CM | POA: Diagnosis not present

## 2022-09-12 DIAGNOSIS — Z9181 History of falling: Secondary | ICD-10-CM | POA: Diagnosis not present

## 2022-09-12 DIAGNOSIS — E7800 Pure hypercholesterolemia, unspecified: Secondary | ICD-10-CM | POA: Diagnosis not present

## 2022-09-12 DIAGNOSIS — D638 Anemia in other chronic diseases classified elsewhere: Secondary | ICD-10-CM | POA: Diagnosis not present

## 2022-09-12 DIAGNOSIS — I4891 Unspecified atrial fibrillation: Secondary | ICD-10-CM | POA: Diagnosis not present

## 2022-09-12 DIAGNOSIS — Z556 Problems related to health literacy: Secondary | ICD-10-CM | POA: Diagnosis not present

## 2022-09-12 DIAGNOSIS — Z7901 Long term (current) use of anticoagulants: Secondary | ICD-10-CM | POA: Diagnosis not present

## 2022-09-12 DIAGNOSIS — C210 Malignant neoplasm of anus, unspecified: Secondary | ICD-10-CM | POA: Diagnosis not present

## 2022-09-12 DIAGNOSIS — I10 Essential (primary) hypertension: Secondary | ICD-10-CM | POA: Diagnosis not present

## 2022-09-12 DIAGNOSIS — E611 Iron deficiency: Secondary | ICD-10-CM | POA: Diagnosis not present

## 2022-09-12 DIAGNOSIS — R531 Weakness: Secondary | ICD-10-CM | POA: Diagnosis not present

## 2022-09-12 DIAGNOSIS — D61818 Other pancytopenia: Secondary | ICD-10-CM | POA: Diagnosis not present

## 2022-09-15 DIAGNOSIS — Z9181 History of falling: Secondary | ICD-10-CM | POA: Diagnosis not present

## 2022-09-15 DIAGNOSIS — I10 Essential (primary) hypertension: Secondary | ICD-10-CM | POA: Diagnosis not present

## 2022-09-15 DIAGNOSIS — Z7901 Long term (current) use of anticoagulants: Secondary | ICD-10-CM | POA: Diagnosis not present

## 2022-09-15 DIAGNOSIS — C210 Malignant neoplasm of anus, unspecified: Secondary | ICD-10-CM | POA: Diagnosis not present

## 2022-09-15 DIAGNOSIS — Z556 Problems related to health literacy: Secondary | ICD-10-CM | POA: Diagnosis not present

## 2022-09-15 DIAGNOSIS — D638 Anemia in other chronic diseases classified elsewhere: Secondary | ICD-10-CM | POA: Diagnosis not present

## 2022-09-15 DIAGNOSIS — E7800 Pure hypercholesterolemia, unspecified: Secondary | ICD-10-CM | POA: Diagnosis not present

## 2022-09-15 DIAGNOSIS — I4891 Unspecified atrial fibrillation: Secondary | ICD-10-CM | POA: Diagnosis not present

## 2022-09-26 ENCOUNTER — Encounter (HOSPITAL_COMMUNITY): Payer: Self-pay | Admitting: Family Medicine

## 2022-09-26 ENCOUNTER — Other Ambulatory Visit: Payer: Self-pay

## 2022-09-26 ENCOUNTER — Emergency Department (HOSPITAL_COMMUNITY): Payer: Medicare PPO

## 2022-09-26 ENCOUNTER — Inpatient Hospital Stay (HOSPITAL_COMMUNITY)
Admission: EM | Admit: 2022-09-26 | Discharge: 2022-09-28 | DRG: 309 | Disposition: A | Discharge status: Home Health | Payer: Medicare PPO | Attending: Internal Medicine | Admitting: Internal Medicine

## 2022-09-26 DIAGNOSIS — W010XXA Fall on same level from slipping, tripping and stumbling without subsequent striking against object, initial encounter: Secondary | ICD-10-CM | POA: Diagnosis present

## 2022-09-26 DIAGNOSIS — I110 Hypertensive heart disease with heart failure: Secondary | ICD-10-CM | POA: Diagnosis not present

## 2022-09-26 DIAGNOSIS — Y92009 Unspecified place in unspecified non-institutional (private) residence as the place of occurrence of the external cause: Secondary | ICD-10-CM | POA: Diagnosis not present

## 2022-09-26 DIAGNOSIS — M47817 Spondylosis without myelopathy or radiculopathy, lumbosacral region: Secondary | ICD-10-CM | POA: Diagnosis not present

## 2022-09-26 DIAGNOSIS — Z923 Personal history of irradiation: Secondary | ICD-10-CM | POA: Diagnosis not present

## 2022-09-26 DIAGNOSIS — M4856XA Collapsed vertebra, not elsewhere classified, lumbar region, initial encounter for fracture: Secondary | ICD-10-CM | POA: Diagnosis present

## 2022-09-26 DIAGNOSIS — M25551 Pain in right hip: Secondary | ICD-10-CM | POA: Diagnosis not present

## 2022-09-26 DIAGNOSIS — Z880 Allergy status to penicillin: Secondary | ICD-10-CM

## 2022-09-26 DIAGNOSIS — Z9841 Cataract extraction status, right eye: Secondary | ICD-10-CM

## 2022-09-26 DIAGNOSIS — G319 Degenerative disease of nervous system, unspecified: Secondary | ICD-10-CM | POA: Diagnosis not present

## 2022-09-26 DIAGNOSIS — S32000A Wedge compression fracture of unspecified lumbar vertebra, initial encounter for closed fracture: Secondary | ICD-10-CM | POA: Diagnosis present

## 2022-09-26 DIAGNOSIS — Z821 Family history of blindness and visual loss: Secondary | ICD-10-CM

## 2022-09-26 DIAGNOSIS — Z91148 Patient's other noncompliance with medication regimen for other reason: Secondary | ICD-10-CM

## 2022-09-26 DIAGNOSIS — Z832 Family history of diseases of the blood and blood-forming organs and certain disorders involving the immune mechanism: Secondary | ICD-10-CM | POA: Diagnosis not present

## 2022-09-26 DIAGNOSIS — Z96651 Presence of right artificial knee joint: Secondary | ICD-10-CM | POA: Diagnosis present

## 2022-09-26 DIAGNOSIS — D509 Iron deficiency anemia, unspecified: Secondary | ICD-10-CM | POA: Diagnosis not present

## 2022-09-26 DIAGNOSIS — Z8701 Personal history of pneumonia (recurrent): Secondary | ICD-10-CM

## 2022-09-26 DIAGNOSIS — M549 Dorsalgia, unspecified: Secondary | ICD-10-CM | POA: Diagnosis not present

## 2022-09-26 DIAGNOSIS — Z85048 Personal history of other malignant neoplasm of rectum, rectosigmoid junction, and anus: Secondary | ICD-10-CM | POA: Diagnosis not present

## 2022-09-26 DIAGNOSIS — Z90710 Acquired absence of both cervix and uterus: Secondary | ICD-10-CM | POA: Diagnosis not present

## 2022-09-26 DIAGNOSIS — Z9221 Personal history of antineoplastic chemotherapy: Secondary | ICD-10-CM

## 2022-09-26 DIAGNOSIS — I10 Essential (primary) hypertension: Secondary | ICD-10-CM | POA: Diagnosis present

## 2022-09-26 DIAGNOSIS — Z7901 Long term (current) use of anticoagulants: Secondary | ICD-10-CM | POA: Diagnosis not present

## 2022-09-26 DIAGNOSIS — I4891 Unspecified atrial fibrillation: Secondary | ICD-10-CM | POA: Diagnosis not present

## 2022-09-26 DIAGNOSIS — S32050A Wedge compression fracture of fifth lumbar vertebra, initial encounter for closed fracture: Secondary | ICD-10-CM

## 2022-09-26 DIAGNOSIS — I480 Paroxysmal atrial fibrillation: Principal | ICD-10-CM | POA: Diagnosis present

## 2022-09-26 DIAGNOSIS — D649 Anemia, unspecified: Secondary | ICD-10-CM

## 2022-09-26 DIAGNOSIS — K219 Gastro-esophageal reflux disease without esophagitis: Secondary | ICD-10-CM | POA: Diagnosis present

## 2022-09-26 DIAGNOSIS — M47816 Spondylosis without myelopathy or radiculopathy, lumbar region: Secondary | ICD-10-CM | POA: Diagnosis not present

## 2022-09-26 DIAGNOSIS — D696 Thrombocytopenia, unspecified: Secondary | ICD-10-CM | POA: Diagnosis present

## 2022-09-26 DIAGNOSIS — W19XXXA Unspecified fall, initial encounter: Secondary | ICD-10-CM | POA: Diagnosis not present

## 2022-09-26 DIAGNOSIS — G44309 Post-traumatic headache, unspecified, not intractable: Secondary | ICD-10-CM | POA: Diagnosis not present

## 2022-09-26 DIAGNOSIS — R0689 Other abnormalities of breathing: Secondary | ICD-10-CM | POA: Diagnosis not present

## 2022-09-26 DIAGNOSIS — I5022 Chronic systolic (congestive) heart failure: Secondary | ICD-10-CM | POA: Diagnosis not present

## 2022-09-26 DIAGNOSIS — Z79899 Other long term (current) drug therapy: Secondary | ICD-10-CM

## 2022-09-26 DIAGNOSIS — Z96641 Presence of right artificial hip joint: Secondary | ICD-10-CM | POA: Diagnosis present

## 2022-09-26 DIAGNOSIS — Z9842 Cataract extraction status, left eye: Secondary | ICD-10-CM

## 2022-09-26 DIAGNOSIS — M545 Low back pain, unspecified: Secondary | ICD-10-CM | POA: Diagnosis not present

## 2022-09-26 DIAGNOSIS — Z792 Long term (current) use of antibiotics: Secondary | ICD-10-CM

## 2022-09-26 DIAGNOSIS — Z825 Family history of asthma and other chronic lower respiratory diseases: Secondary | ICD-10-CM

## 2022-09-26 DIAGNOSIS — I6782 Cerebral ischemia: Secondary | ICD-10-CM | POA: Diagnosis not present

## 2022-09-26 DIAGNOSIS — Z882 Allergy status to sulfonamides status: Secondary | ICD-10-CM

## 2022-09-26 DIAGNOSIS — R000 Tachycardia, unspecified: Secondary | ICD-10-CM | POA: Diagnosis not present

## 2022-09-26 DIAGNOSIS — R531 Weakness: Secondary | ICD-10-CM

## 2022-09-26 DIAGNOSIS — Z888 Allergy status to other drugs, medicaments and biological substances status: Secondary | ICD-10-CM

## 2022-09-26 DIAGNOSIS — Z961 Presence of intraocular lens: Secondary | ICD-10-CM | POA: Diagnosis present

## 2022-09-26 DIAGNOSIS — Z886 Allergy status to analgesic agent status: Secondary | ICD-10-CM

## 2022-09-26 DIAGNOSIS — Z8249 Family history of ischemic heart disease and other diseases of the circulatory system: Secondary | ICD-10-CM

## 2022-09-26 DIAGNOSIS — I6523 Occlusion and stenosis of bilateral carotid arteries: Secondary | ICD-10-CM | POA: Diagnosis not present

## 2022-09-26 LAB — CBC
HCT: 29.2 % — ABNORMAL LOW (ref 36.0–46.0)
Hemoglobin: 9.5 g/dL — ABNORMAL LOW (ref 12.0–15.0)
MCH: 30.3 pg (ref 26.0–34.0)
MCHC: 32.5 g/dL (ref 30.0–36.0)
MCV: 93 fL (ref 80.0–100.0)
Platelets: 138 10*3/uL — ABNORMAL LOW (ref 150–400)
RBC: 3.14 MIL/uL — ABNORMAL LOW (ref 3.87–5.11)
RDW: 14.6 % (ref 11.5–15.5)
WBC: 6.5 10*3/uL (ref 4.0–10.5)
nRBC: 0 % (ref 0.0–0.2)

## 2022-09-26 LAB — COMPREHENSIVE METABOLIC PANEL
ALT: 12 U/L (ref 0–44)
AST: 21 U/L (ref 15–41)
Albumin: 3.7 g/dL (ref 3.5–5.0)
Alkaline Phosphatase: 85 U/L (ref 38–126)
Anion gap: 11 (ref 5–15)
BUN: 15 mg/dL (ref 8–23)
CO2: 20 mmol/L — ABNORMAL LOW (ref 22–32)
Calcium: 9.5 mg/dL (ref 8.9–10.3)
Chloride: 103 mmol/L (ref 98–111)
Creatinine, Ser: 0.88 mg/dL (ref 0.44–1.00)
GFR, Estimated: >60 mL/min (ref >60)
Glucose, Bld: 131 mg/dL — ABNORMAL HIGH (ref 70–99)
Potassium: 4.2 mmol/L (ref 3.5–5.1)
Sodium: 134 mmol/L — ABNORMAL LOW (ref 135–145)
Total Bilirubin: 1.5 mg/dL — ABNORMAL HIGH (ref 0.3–1.2)
Total Protein: 7.7 g/dL (ref 6.5–8.1)

## 2022-09-26 MED ORDER — ONDANSETRON HCL 4 MG/2ML IJ SOLN
4.0000 mg | Freq: Once | INTRAMUSCULAR | Status: AC
  Administered 2022-09-26: 4 mg via INTRAVENOUS
  Filled 2022-09-26: qty 2

## 2022-09-26 MED ORDER — DILTIAZEM LOAD VIA INFUSION
10.0000 mg | Freq: Once | INTRAVENOUS | Status: AC
  Administered 2022-09-26: 10 mg via INTRAVENOUS
  Filled 2022-09-26: qty 10

## 2022-09-26 MED ORDER — MORPHINE SULFATE (PF) 2 MG/ML IV SOLN
2.0000 mg | Freq: Once | INTRAVENOUS | Status: AC
  Administered 2022-09-26: 2 mg via INTRAVENOUS
  Filled 2022-09-26: qty 1

## 2022-09-26 MED ORDER — LACTATED RINGERS IV BOLUS
1000.0000 mL | Freq: Once | INTRAVENOUS | Status: AC
  Administered 2022-09-26: 1000 mL via INTRAVENOUS

## 2022-09-26 MED ORDER — DILTIAZEM HCL-DEXTROSE 125-5 MG/125ML-% IV SOLN (PREMIX)
5.0000 mg/h | INTRAVENOUS | Status: DC
  Administered 2022-09-26: 5 mg/h via INTRAVENOUS
  Administered 2022-09-27: 10 mg/h via INTRAVENOUS
  Filled 2022-09-26 (×2): qty 125

## 2022-09-26 NOTE — ED Notes (Signed)
Patient transported to CT 

## 2022-09-26 NOTE — ED Provider Notes (Signed)
[place] EMERGENCY DEPARTMENT AT [facility] Provider Note   CSN: [id number] Arrival date & time: 09/26/22  2156     History  Chief Complaint  Patient presents with   Bailey Wilson    Bailey Wilson is a 82 y.o. female.  Pt with hx anal ca, afib on eliquis, presents after fall at home. Pt v poor historian - level 5 caveat. Pt indicates thought she was getting out of bed but feel at foot of bed. Denies loc. ?hit head. C/o neck and low back pain. No radicular pain. No numbness/weakness. Denies severe headache. No chest pain or sob. No abd pain or nv. No dysuria. No extremity pain/injury. Family indicates recent poor po intake, general weakness, and indicates has not been taking meds regularly.   The history is provided by the patient, medical records, a relative and the EMS personnel. The history is limited by the condition of the patient.  Fall Pertinent negatives include no chest pain, no abdominal pain, no headaches and no shortness of breath.       Home Medications Prior to Admission medications   Medication Sig Start Date End Date Taking? Authorizing Provider  albuterol (PROVENTIL HFA;VENTOLIN HFA) 108 (90 Base) MCG/ACT inhaler Inhale 2 puffs into the lungs every 6 (six) hours as needed for wheezing or shortness of breath.    [provider]  amLODipine (NORVASC) 5 MG tablet TAKE 1 TABLET BY MOUTH EVERY DAY IN THE MORNING 04/18/22   Jonelle Sidle, MD  apixaban (ELIQUIS) 5 MG TABS tablet TAKE 1 TABLET BY MOUTH TWICE A DAY 03/14/22   Jonelle Sidle, MD  ARTIFICIAL TEAR OP Apply 2 drops to eye every morning.     [provider]  AZO-CRANBERRY PO Take 2 tablets by mouth every morning.     [provider]  Calcium 600-200 MG-UNIT tablet Take 1 tablet by mouth daily.    [provider]  calcium carbonate (TUMS - DOSED IN MG ELEMENTAL CALCIUM) 500 MG chewable tablet Chew 1 tablet by mouth daily as needed for indigestion or heartburn.     [provider]  cetirizine (ZYRTEC) 10 MG tablet Take 10 mg by mouth every morning.     [provider]  docusate sodium (COLACE) 100 MG capsule Take 100 mg by mouth daily as needed for mild constipation.     [provider]  hydrochlorothiazide (HYDRODIURIL) 25 MG tablet Take 25 mg by mouth every morning.     [provider]  magnesium oxide (MAG-OX) 400 (240 Mg) MG tablet Take 1 tablet (400 mg total) by mouth daily. 03/14/22   Catarina Hartshorn, MD  metoprolol tartrate (LOPRESSOR) 25 MG tablet TAKE 1 TABLET BY MOUTH TWICE A DAY 06/30/20   Chilton Si, MD  Multiple Vitamins-Minerals (ICAPS MV PO) Take 1 capsule by mouth every morning.     [provider]  nitrofurantoin, macrocrystal-monohydrate, (MACROBID) 100 MG capsule Take 1 capsule (100 mg total) by mouth every 12 (twelve) hours. 03/13/22   Catarina Hartshorn, MD  sodium chloride (OCEAN) 0.65 % SOLN nasal spray Place 1 spray into both nostrils as needed for congestion.    [provider]      Allergies    Penicillins, Ace inhibitors, Aspirin, and Sulfa antibiotics    Review of Systems   Review of Systems  Constitutional:  Negative for fever.  HENT:  Negative for sore throat.   Eyes:  Negative for visual disturbance.  Respiratory:  Negative for cough and  shortness of breath.   Cardiovascular:  Negative for chest pain.  Gastrointestinal:  Negative for abdominal pain, diarrhea and vomiting.  Genitourinary:  Negative for dysuria and flank pain.  Musculoskeletal:  Positive for back pain.  Skin:  Negative for rash.  Neurological:  Negative for speech difficulty, weakness, numbness and headaches.    Physical Exam Updated Vital Signs BP 134/70   Pulse (!) 107   Temp 99.1 F (37.3 C) (Oral)   Resp (!) 23   Ht 1.702 m (5\' 7" )   Wt 63.5 kg   SpO2 93%   BMI 21.93 kg/m  Physical Exam Vitals and nursing note reviewed.  Constitutional:      Appearance: Normal appearance. She is  well-developed.  HENT:     Head:     Comments: Tenderness posterior scalp.     Nose: Nose normal.     Mouth/Throat:     Mouth: Mucous membranes are moist.  Eyes:     General: No scleral icterus.    Conjunctiva/sclera: Conjunctivae normal.     Pupils: Pupils are equal, round, and reactive to light.  Neck:     Trachea: No tracheal deviation.  Cardiovascular:     Rate and Rhythm: Tachycardia present. Rhythm irregular.     Pulses: Normal pulses.     Heart sounds: Normal heart sounds. No murmur heard.    No friction rub. No gallop.  Pulmonary:     Effort: Pulmonary effort is normal. No respiratory distress.     Breath sounds: Normal breath sounds.  Chest:     Chest wall: No tenderness.  Abdominal:     General: Bowel sounds are normal. There is no distension.     Palpations: Abdomen is soft.     Tenderness: There is no abdominal tenderness.  Genitourinary:    Comments: No cva tenderness.  Musculoskeletal:        General: No swelling or tenderness.     Cervical back: Normal range of motion and neck supple. No rigidity. No muscular tenderness.     Comments: Mid cervical and mid to upper lumbar tenderness, otherwise, CTLS spine, non tender, aligned, no step off. Good rom bil extremities without pain or focal bony tenderness.   Skin:    General: Skin is warm and dry.     Findings: No rash.  Neurological:     Mental Status: She is alert.     Comments: Alert, speech normal. Gcs 15. Motor/sens grossly intact bil.   Psychiatric:        Mood and Affect: Mood normal.     ED Results / Procedures / Treatments   Labs (all labs ordered are listed, but only abnormal results are displayed) Results for orders placed or performed during the hospital encounter of 09/26/22  CBC  Result Value Ref Range   WBC 6.5 4.0 - 10.5 K/uL   RBC 3.14 (L) 3.87 - 5.11 MIL/uL   Hemoglobin 9.5 (L) 12.0 - 15.0 g/dL   HCT 62.9 (L) 52.8 - 41.3 %   MCV 93.0 80.0 - 100.0 fL   MCH 30.3 26.0 - 34.0 pg   MCHC  32.5 30.0 - 36.0 g/dL   RDW 24.4 01.0 - 27.2 %   Platelets 138 (L) 150 - 400 K/uL   nRBC 0.0 0.0 - 0.2 %  Comprehensive metabolic panel  Result Value Ref Range   Sodium 134 (L) 135 - 145 mmol/L   Potassium 4.2 3.5 - 5.1 mmol/L   Chloride 103 98 - 111 mmol/L  CO2 20 (L) 22 - 32 mmol/L   Glucose, Bld 131 (H) 70 - 99 mg/dL   BUN 15 8 - 23 mg/dL   Creatinine, Ser 2.59 0.44 - 1.00 mg/dL   Calcium 9.5 8.9 - 56.3 mg/dL   Total Protein 7.7 6.5 - 8.1 g/dL   Albumin 3.7 3.5 - 5.0 g/dL   AST 21 15 - 41 U/L   ALT 12 0 - 44 U/L   Alkaline Phosphatase 85 38 - 126 U/L   Total Bilirubin 1.5 (H) 0.3 - 1.2 mg/dL   GFR, Estimated >87 >56 mL/min   Anion gap 11 5 - 15     EKG EKG Interpretation Date/Time:  Monday September 26 2022 22:16:37 EDT Ventricular Rate:  130 PR Interval:    QRS Duration:  84 QT Interval:  262 QTC Calculation: 385 R Axis:   -8  Text Interpretation: Atrial fibrillation with rapid ventricular response Confirmed by Cathren Laine [id number]) on 09/26/2022 10:29:31 PM  Radiology CT Lumbar Spine Wo Contrast  Result Date: 09/26/2022 CLINICAL DATA:  Fall, low back pain EXAM: CT LUMBAR SPINE WITHOUT CONTRAST TECHNIQUE: Multidetector CT imaging of the lumbar spine was performed without intravenous contrast administration. Multiplanar CT image reconstructions were also generated. RADIATION DOSE REDUCTION: This exam was performed according to the departmental dose-optimization program which includes automated exposure control, adjustment of the mA and/or kV according to patient size and/or use of iterative reconstruction technique. COMPARISON:  CT abdomen/pelvis dated 03/12/2022 FINDINGS: Segmentation: 5 lumbar type vertebral bodies. Alignment: Normal lumbar lordosis. Vertebrae: Moderate superior endplate compression fracture deformity at L5, with approximately 40% loss of height (sagittal image 32). No retropulsion. While this appearance may be subacute or chronic, it is new from January  2024. Vertebral body heights are otherwise maintained. Paraspinal and other soft tissues: Negative. Disc levels: Mild degenerative changes of the lumbar spine, most prominent at L1-2 and L5-S1. Spinal canal is patent. IMPRESSION: Moderate superior endplate compression fracture deformity at L5, as above. While possibly subacute or chronic, this is new from January 2024. Otherwise, no traumatic injury to the lumbar spine. Mild degenerative changes. Electronically Signed   By: Charline Bills M.D.   On: 09/26/2022 23:12   CT Head Wo Contrast  Result Date: 09/26/2022 CLINICAL DATA:  Recent fall with headaches and neck pain, initial encounter EXAM: CT HEAD WITHOUT CONTRAST CT CERVICAL SPINE WITHOUT CONTRAST TECHNIQUE: Multidetector CT imaging of the head and cervical spine was performed following the standard protocol without intravenous contrast. Multiplanar CT image reconstructions of the cervical spine were also generated. RADIATION DOSE REDUCTION: This exam was performed according to the departmental dose-optimization program which includes automated exposure control, adjustment of the mA and/or kV according to patient size and/or use of iterative reconstruction technique. COMPARISON:  03/09/2022 FINDINGS: CT HEAD FINDINGS Brain: No evidence of acute infarction, hemorrhage, hydrocephalus, extra-axial collection or mass lesion/mass effect. Chronic atrophic and ischemic changes are noted. Vascular: No hyperdense vessel or unexpected calcification. Skull: Normal. Negative for fracture or focal lesion. Sinuses/Orbits: No acute finding. Other: None. CT CERVICAL SPINE FINDINGS Alignment: Within normal limits. Skull base and vertebrae: Cervical segments are well visualized. Vertebral body height is maintained. Multilevel disc space narrowing is seen. Mild osteophytic changes and facet hypertrophic changes are noted. No acute fracture or acute facet abnormality is noted. Soft tissues and spinal canal: Surrounding soft  tissue structures are within normal limits. Vascular calcifications are seen. Right chest wall port is noted. Upper chest: Lung apices demonstrate pleural and parenchymal scarring  bilaterally. Other: None IMPRESSION: CT of the head: Chronic atrophic and ischemic changes without acute abnormality. CT of cervical spine: Multilevel degenerative change without acute abnormality. Electronically Signed   By: Alcide Clever M.D.   On: 09/26/2022 23:10   CT Cervical Spine Wo Contrast  Result Date: 09/26/2022 CLINICAL DATA:  Recent fall with headaches and neck pain, initial encounter EXAM: CT HEAD WITHOUT CONTRAST CT CERVICAL SPINE WITHOUT CONTRAST TECHNIQUE: Multidetector CT imaging of the head and cervical spine was performed following the standard protocol without intravenous contrast. Multiplanar CT image reconstructions of the cervical spine were also generated. RADIATION DOSE REDUCTION: This exam was performed according to the departmental dose-optimization program which includes automated exposure control, adjustment of the mA and/or kV according to patient size and/or use of iterative reconstruction technique. COMPARISON:  03/09/2022 FINDINGS: CT HEAD FINDINGS Brain: No evidence of acute infarction, hemorrhage, hydrocephalus, extra-axial collection or mass lesion/mass effect. Chronic atrophic and ischemic changes are noted. Vascular: No hyperdense vessel or unexpected calcification. Skull: Normal. Negative for fracture or focal lesion. Sinuses/Orbits: No acute finding. Other: None. CT CERVICAL SPINE FINDINGS Alignment: Within normal limits. Skull base and vertebrae: Cervical segments are well visualized. Vertebral body height is maintained. Multilevel disc space narrowing is seen. Mild osteophytic changes and facet hypertrophic changes are noted. No acute fracture or acute facet abnormality is noted. Soft tissues and spinal canal: Surrounding soft tissue structures are within normal limits. Vascular calcifications  are seen. Right chest wall port is noted. Upper chest: Lung apices demonstrate pleural and parenchymal scarring bilaterally. Other: None IMPRESSION: CT of the head: Chronic atrophic and ischemic changes without acute abnormality. CT of cervical spine: Multilevel degenerative change without acute abnormality. Electronically Signed   By: Alcide Clever M.D.   On: 09/26/2022 23:10    Procedures Procedures    Medications Ordered in ED Medications  diltiazem (CARDIZEM) 1 mg/mL load via infusion 10 mg (10 mg Intravenous Bolus from Bag 09/26/22 2312)    And  diltiazem (CARDIZEM) 125 mg in dextrose 5% 125 mL (1 mg/mL) infusion (5 mg/hr Intravenous New Bag/Given 09/26/22 2311)  morphine (PF) 2 MG/ML injection 2 mg (has no administration in time range)  ondansetron (ZOFRAN) injection 4 mg (has no administration in time range)  lactated ringers bolus 1,000 mL (1,000 mLs Intravenous New Bag/Given 09/26/22 2305)    ED Course/ Medical Decision Making/ A&P                                 Medical Decision Making Problems Addressed: Atrial fibrillation with rapid ventricular response [name]): acute illness or injury with systemic symptoms that poses a threat to life or bodily functions Compression fracture of L5 vertebra, initial encounter [facility]): acute illness or injury with systemic symptoms that poses a threat to life or bodily functions Fall from slip, trip, or stumble, initial encounter: acute illness or injury with systemic symptoms that poses a threat to life or bodily functions Generalized weakness: acute illness or injury with systemic symptoms Noncompliance with medication regimen: acute illness or injury  Amount and/or Complexity of Data Reviewed Independent Historian:     Details: Family, hx External Data Reviewed: notes. Labs: ordered. Decision-making details documented in ED Course. Radiology: ordered and independent interpretation performed. Decision-making details documented in ED  Course. ECG/medicine tests: ordered and independent interpretation performed. Decision-making details documented in ED Course.  Risk Prescription drug management. Parenteral controlled substances. Decision regarding hospitalization.  Iv ns. Continuous pulse ox and cardiac monitoring. Labs ordered/sent. Imaging ordered.   Differential diagnosis includes head injury, sdh, afib/rvr, dehydration, etc. Dispo decision including potential need for admission considered - will get labs and imaging and reassess.   Reviewed nursing notes and prior charts for additional history. External reports reviewed. Additional history from: family, ems.   Cardiac monitor: afib, rate 130.  LR bolus. Cardizem bolus/gtt. As pt has missed multiple doses of her meds including eliquis, feel not safe for ED cardioversion.   Labs reviewed/interpreted by me - wbc normal. Cr normal. Ua pending.   CT reviewed/interpreted by me - L5 compression fx. No hemorrhage.   Hr improved w cardizem. No chest pain.   Hospitalist consulted for admission. Ua and hip xrays remain pending.   CRITICAL CARE RE: fall on eliquis, atrial fibrillation with rapid ventricular response, generalized weakness.  Performed by: Suzi Roots Total critical care time: 40 minutes Critical care time was exclusive of separately billable procedures and treating other patients. Critical care was necessary to treat or prevent imminent or life-threatening deterioration. Critical care was time spent personally by me on the following activities: development of treatment plan with patient and/or surrogate as well as nursing, discussions with consultants, evaluation of patient's response to treatment, examination of patient, obtaining history from patient or surrogate, ordering and performing treatments and interventions, ordering and review of laboratory studies, ordering and review of radiographic studies, pulse oximetry and re-evaluation of patient's  condition.           Final Clinical Impression(s) / ED Diagnoses Final diagnoses:  None    Rx / DC Orders ED Discharge Orders     None         Cathren Laine, MD 09/26/22 2326

## 2022-09-26 NOTE — ED Triage Notes (Signed)
Pt bib [facility] from home after a unwitnessed mechanical fall. Denies LOC or hitting head. Pt is on Eliquis. C/o lower back pain. C Collar applied PTA. Pt A&O in triage.

## 2022-09-27 ENCOUNTER — Encounter (HOSPITAL_COMMUNITY): Payer: Self-pay | Admitting: Family Medicine

## 2022-09-27 DIAGNOSIS — I4891 Unspecified atrial fibrillation: Secondary | ICD-10-CM | POA: Diagnosis not present

## 2022-09-27 DIAGNOSIS — I5022 Chronic systolic (congestive) heart failure: Secondary | ICD-10-CM | POA: Diagnosis present

## 2022-09-27 LAB — HEPATIC FUNCTION PANEL
ALT: 13 U/L (ref 0–44)
AST: 20 U/L (ref 15–41)
Albumin: 3.7 g/dL (ref 3.5–5.0)
Alkaline Phosphatase: 83 U/L (ref 38–126)
Bilirubin, Direct: 0.2 mg/dL (ref 0.0–0.2)
Indirect Bilirubin: 0.8 mg/dL (ref 0.3–0.9)
Total Bilirubin: 1 mg/dL (ref 0.3–1.2)
Total Protein: 7.7 g/dL (ref 6.5–8.1)

## 2022-09-27 LAB — BASIC METABOLIC PANEL
Anion gap: 9 (ref 5–15)
BUN: 15 mg/dL (ref 8–23)
CO2: 22 mmol/L (ref 22–32)
Calcium: 9.4 mg/dL (ref 8.9–10.3)
Chloride: 103 mmol/L (ref 98–111)
Creatinine, Ser: 0.89 mg/dL (ref 0.44–1.00)
GFR, Estimated: >60 mL/min (ref >60)
Glucose, Bld: 141 mg/dL — ABNORMAL HIGH (ref 70–99)
Potassium: 3.8 mmol/L (ref 3.5–5.1)
Sodium: 134 mmol/L — ABNORMAL LOW (ref 135–145)

## 2022-09-27 LAB — GLUCOSE, CAPILLARY
Glucose-Capillary: 129 mg/dL — ABNORMAL HIGH (ref 70–99)
Glucose-Capillary: 142 mg/dL — ABNORMAL HIGH (ref 70–99)

## 2022-09-27 LAB — CBC
HCT: 33.4 % — ABNORMAL LOW (ref 36.0–46.0)
Hemoglobin: 10.4 g/dL — ABNORMAL LOW (ref 12.0–15.0)
MCH: 30.4 pg (ref 26.0–34.0)
MCHC: 31.1 g/dL (ref 30.0–36.0)
MCV: 97.7 fL (ref 80.0–100.0)
Platelets: 126 10*3/uL — ABNORMAL LOW (ref 150–400)
RBC: 3.42 MIL/uL — ABNORMAL LOW (ref 3.87–5.11)
RDW: 14.6 % (ref 11.5–15.5)
WBC: 6.6 10*3/uL (ref 4.0–10.5)
nRBC: 0 % (ref 0.0–0.2)

## 2022-09-27 LAB — FOLATE: Folate: 10.2 ng/mL (ref >5.9)

## 2022-09-27 LAB — RETICULOCYTES
Immature Retic Fract: 15.6 % (ref 2.3–15.9)
RBC.: 3.4 MIL/uL — ABNORMAL LOW (ref 3.87–5.11)
Retic Count, Absolute: 68 10*3/uL (ref 19.0–186.0)
Retic Ct Pct: 2 % (ref 0.4–3.1)

## 2022-09-27 LAB — IRON AND TIBC
Iron: 44 ug/dL (ref 28–170)
Saturation Ratios: 19 % (ref 10.4–31.8)
TIBC: 232 ug/dL — ABNORMAL LOW (ref 250–450)
UIBC: 188 ug/dL

## 2022-09-27 LAB — VITAMIN B12: Vitamin B-12: 166 pg/mL — ABNORMAL LOW (ref 180–914)

## 2022-09-27 LAB — MAGNESIUM: Magnesium: 1.7 mg/dL (ref 1.7–2.4)

## 2022-09-27 LAB — MRSA NEXT GEN BY PCR, NASAL: MRSA by PCR Next Gen: NOT DETECTED

## 2022-09-27 LAB — FERRITIN: Ferritin: 338 ng/mL — ABNORMAL HIGH (ref 11–307)

## 2022-09-27 MED ORDER — ACETAMINOPHEN 325 MG PO TABS: 650.0000 mg | ORAL_TABLET | Freq: Four times a day (QID) | ORAL | Status: DC | PRN

## 2022-09-27 MED ORDER — SODIUM CHLORIDE 0.9% FLUSH
3.0000 mL | Freq: Two times a day (BID) | INTRAVENOUS | Status: DC
  Administered 2022-09-27 (×2): 3 mL via INTRAVENOUS

## 2022-09-27 MED ORDER — METOPROLOL TARTRATE 5 MG/5ML IV SOLN: 2.5000 mg | INTRAVENOUS | Status: DC | PRN

## 2022-09-27 MED ORDER — ONDANSETRON HCL 4 MG/2ML IJ SOLN: 4.0000 mg | Freq: Four times a day (QID) | INTRAMUSCULAR | Status: DC | PRN

## 2022-09-27 MED ORDER — ACETAMINOPHEN 650 MG RE SUPP: 650.0000 mg | Freq: Four times a day (QID) | RECTAL | Status: DC | PRN

## 2022-09-27 MED ORDER — ONDANSETRON HCL 4 MG PO TABS: 4.0000 mg | ORAL_TABLET | Freq: Four times a day (QID) | ORAL | Status: DC | PRN

## 2022-09-27 MED ORDER — TRAMADOL HCL 50 MG PO TABS: 50.0000 mg | ORAL_TABLET | Freq: Four times a day (QID) | ORAL | Status: DC | PRN

## 2022-09-27 MED ORDER — POLYETHYLENE GLYCOL 3350 17 G PO PACK: 17.0000 g | PACK | Freq: Every day | ORAL | Status: DC | PRN

## 2022-09-27 MED ORDER — BISACODYL 5 MG PO TBEC: 5.0000 mg | DELAYED_RELEASE_TABLET | Freq: Every day | ORAL | Status: DC | PRN

## 2022-09-27 MED ORDER — CHLORHEXIDINE GLUCONATE CLOTH 2 % EX PADS
6.0000 | MEDICATED_PAD | Freq: Every day | CUTANEOUS | Status: DC
  Administered 2022-09-27: 6 via TOPICAL

## 2022-09-27 MED ORDER — FENTANYL CITRATE PF 50 MCG/ML IJ SOSY: 12.5000 ug | PREFILLED_SYRINGE | INTRAMUSCULAR | Status: DC | PRN

## 2022-09-27 MED ORDER — METOPROLOL TARTRATE 25 MG PO TABS
25.0000 mg | ORAL_TABLET | Freq: Two times a day (BID) | ORAL | Status: DC
  Administered 2022-09-27 – 2022-09-28 (×3): 25 mg via ORAL
  Filled 2022-09-27 (×3): qty 1

## 2022-09-27 MED ORDER — APIXABAN 5 MG PO TABS
5.0000 mg | ORAL_TABLET | Freq: Two times a day (BID) | ORAL | Status: DC
  Administered 2022-09-27 – 2022-09-28 (×4): 5 mg via ORAL
  Filled 2022-09-27 (×4): qty 1

## 2022-09-27 MED ORDER — METHOCARBAMOL 1000 MG/10ML IJ SOLN: 500.0000 mg | Freq: Four times a day (QID) | INTRAVENOUS | Status: DC | PRN

## 2022-09-27 NOTE — ED Notes (Signed)
This RN responded to Bed Alarm. Found pt trying to get up out of bed once again to use the bathroom. Pt assisted to and back from bedside commode. Pt readjusted in bed and posey bed alarm readjusted. Pt is comfortable denies any additional needs.

## 2022-09-27 NOTE — Evaluation (Addendum)
Physical Therapy Evaluation Patient Details Name: Bailey Wilson MRN: [id number] DOB: Aug 22, 1940 Today's Date: 09/27/2022  History of Present Illness  Bailey Wilson is a pleasant 82 y.o. female with medical history significant for anal cancer status post chemo and radiation, atrial fibrillation on Eliquis, chronic HFmrEF, hypertension, and iron deficiency anemia who presents to the ED with back pain after a fall at home.     Patient had a ground-level fall at the foot of her bed today and has been complaining of back pain.  She denies hitting her head and does not believe that she lost consciousness.  Her family notes that she had been complaining of back pain a few days ago as well.  Family also notes that she has not been taking her medications regularly in the past couple weeks.     Patient reports pain in her mid back but denies any other pain and has no other complaints in the ED.   Clinical Impression  Pt is pleasant and presented to therapy on 2L of O2 via . Reported not using O2 at baseline so O2 ws removed for therapy and monitored throughout session. Pt required min assist to perform bed mobility and min/mod assist to transfer from bed to chair. Ambulation limited due to pain in R LE. Nurse informed of LE assessment which produced pain when forcefully dorsiflexed and extended with pressure to calf -- RN notified. Pt tolerated sitting up in chair at end of therapy session. Patient will benefit from continued skilled physical therapy in hospital and recommended venue below to increase strength, balance, endurance for safe ADLs and gait.       If plan is discharge home, recommend the following: A lot of help with walking and/or transfers;A lot of help with bathing/dressing/bathroom;Assistance with cooking/housework;Assist for transportation;Help with stairs or ramp for entrance   Can travel by private vehicle        Equipment Recommendations    Recommendations for Other Services        Functional Status Assessment Patient has had a recent decline in their functional status and demonstrates the ability to make significant improvements in function in a reasonable and predictable amount of time.     Precautions / Restrictions Precautions Precautions: Fall Restrictions Weight Bearing Restrictions: No      Mobility  Bed Mobility Overal bed mobility: Needs Assistance Bed Mobility: Sidelying to Sit, Supine to Sit   Sidelying to sit: Min assist Supine to sit: Min assist     General bed mobility comments: Required assist to sit upright, HOB raised    Transfers Overall transfer level: Needs assistance Equipment used: Rolling walker (2 wheels) Transfers: Sit to/from Stand, Bed to chair/wheelchair/BSC Sit to Stand: Min assist, Mod assist   Step pivot transfers: Min assist, Mod assist       General transfer comment: RW, some labpred steps taken from bed to chair    Ambulation/Gait Ambulation/Gait assistance: Min assist, Mod assist Gait Distance (Feet): 4 Feet (steps taken from bed to chair) Assistive device: Rolling walker (2 wheels) Gait Pattern/deviations: Step-to pattern, Decreased step length - right, Decreased step length - left, Decreased stride length, Decreased stance time - right Gait velocity: slow     General Gait Details: RW, labored movements with limping on R LE due to pain  Stairs            Wheelchair Mobility     Tilt Bed    Modified Rankin (Stroke Patients Only)  Balance Overall balance assessment: Needs assistance Sitting-balance support: Single extremity supported, Feet supported Sitting balance-Leahy Scale: Fair Sitting balance - Comments: seated EOB   Standing balance support: Bilateral upper extremity supported, During functional activity, Reliant on assistive device for balance Standing balance-Leahy Scale: Poor Standing balance comment: RW                             Pertinent Vitals/Pain Pain  Assessment Pain Assessment: Faces Faces Pain Scale: Hurts a little bit Pain Location: RLE Pain Descriptors / Indicators: Discomfort, Grimacing Pain Intervention(s): Limited activity within patient's tolerance, Monitored during session    Home Living Family/patient expects to be discharged to:: Private residence Living Arrangements: Other relatives Available Help at Discharge: Available PRN/intermittently;Available 24 hours/day Type of Home: House Home Access: Stairs to enter Entrance Stairs-Rails: Right;Left;Can reach both Entrance Stairs-Number of Steps: 3   Home Layout: Two level;Able to live on main level with bedroom/bathroom Home Equipment: Rolling Walker (2 wheels);Cane - single point;BSC/3in1 Additional Comments: Reports having both a walker and cane at home but uses the walker more    Prior Function Prior Level of Function : Needs assist       Physical Assist : ADLs (physical)   ADLs (physical): IADLs Mobility Comments: Patient reports being independent with walker at baseline for community and household ambulation. ADLs Comments: Patient reports independent with cooking, dressing, and bathing. Does not drive. Needs help cleaning     Extremity/Trunk Assessment                Communication   Communication Communication: No apparent difficulties  Cognition Arousal: Alert Behavior During Therapy: WFL for tasks assessed/performed Overall Cognitive Status: Within Functional Limits for tasks assessed                                          General Comments      Exercises     Assessment/Plan    PT Assessment Patient needs continued PT services  PT Problem List Decreased strength;Decreased activity tolerance;Decreased balance;Decreased mobility;Decreased range of motion       PT Treatment Interventions DME instruction;Gait training;Stair training;Functional mobility training;Therapeutic activities;Therapeutic exercise;Balance training     PT Goals (Current goals can be found in the Care Plan section)  Acute Rehab PT Goals Patient Stated Goal: Return home with assist of family PT Goal Formulation: With patient Time For Goal Achievement: 10/11/22 Potential to Achieve Goals: Good    Frequency Min 3X/week     Co-evaluation               AM-PAC PT "6 Clicks" Mobility  Outcome Measure Help needed turning from your back to your side while in a flat bed without using bedrails?: A Little Help needed moving from lying on your back to sitting on the side of a flat bed without using bedrails?: A Little Help needed moving to and from a bed to a chair (including a wheelchair)?: A Little Help needed standing up from a chair using your arms (e.g., wheelchair or bedside chair)?: A Lot Help needed to walk in hospital room?: A Lot Help needed climbing 3-5 steps with a railing? : Total 6 Click Score: 14    End of Session   Activity Tolerance: Patient tolerated treatment well;Patient limited by pain Patient left: in chair;with call bell/phone within reach Nurse Communication: Mobility status  PT Visit Diagnosis: Unsteadiness on feet (R26.81);Other abnormalities of gait and mobility (R26.89);Muscle weakness (generalized) (M62.81);History of falling (Z91.81)    Time: 6962-9528 PT Time Calculation (min) (ACUTE ONLY): 24 min   Charges:   PT Evaluation $PT Eval Moderate Complexity: 1 Mod PT Treatments $Therapeutic Activity: 23-37 mins PT General Charges $$ ACUTE PT VISIT: 1 Visit         [facility] [place], DPT Program

## 2022-09-27 NOTE — Plan of Care (Signed)
  Problem: Education: Goal: Knowledge of disease or condition will improve Outcome: Progressing Goal: Understanding of medication regimen will improve Outcome: Progressing Goal: Individualized Educational Video(s) Outcome: Progressing   Problem: Activity: Goal: Ability to tolerate increased activity will improve Outcome: Progressing   

## 2022-09-27 NOTE — ED Notes (Signed)
ED TO INPATIENT HANDOFF REPORT  ED Nurse Name and Phone #: Fredric Mare RN   S Name/Age/Gender Bailey Wilson 82 y.o. female Room/Bed: APA07/APA07  Code Status   Code Status: Full Code  Home/SNF/Other Home Patient oriented to: self, place, and time Is this baseline? Yes   Triage Complete: Triage complete  Chief Complaint Atrial fibrillation with RVR (HCC) [I48.91]  Triage Note Pt bib [facility] from home after a unwitnessed mechanical fall. Denies LOC or hitting head. Pt is on Eliquis. C/o lower back pain. C Collar applied PTA. Pt A&O in triage.    Allergies Allergies  Allergen Reactions   Penicillins Hives and Itching    Has patient had a PCN reaction causing immediate rash, facial/tongue/throat swelling, SOB or lightheadedness with hypotension: YES Has patient had a PCN reaction causing severe rash involving mucus membranes or skin necrosis: No Has patient had a PCN reaction that required hospitalization No Has patient had a PCN reaction occurring within the last 10 years: No If all of the above answers are "NO", then may proceed with Cephalosporin use.    Ace Inhibitors Cough   Aspirin Other (See Comments)    Heart racing (large doses)   Sulfa Antibiotics Itching and Rash    Level of Care/Admitting Diagnosis ED Disposition     ED Disposition  Admit   Condition  --   Comment  Hospital Area: [name] [[id number]]  Level of Care: Stepdown [14]  Covid Evaluation: Asymptomatic - no recent exposure (last 10 days) testing not required  Diagnosis: Atrial fibrillation with RVR [facility]) [[id number]]  Admitting Physician: Briscoe Deutscher [[id number]]  Attending Physician: Briscoe Deutscher [[id number]]  Certification:: I certify this patient will need inpatient services for at least 2 midnights  Estimated Length of Stay: 3          B Medical/Surgery History Past Medical History:  Diagnosis Date   Anemia    Carotid stenosis 06/03/2016   1-39% bilateral ICA stenosis 05/2016    Essential hypertension    GERD (gastroesophageal reflux disease)    History of pneumonia    Osteoarthritis    Right knee   Paroxysmal atrial fibrillation (HCC)    Seasonal allergies    Past Surgical History:  Procedure Laterality Date   ABDOMINAL HYSTERECTOMY  1985   CATARACT EXTRACTION W/ INTRAOCULAR LENS  IMPLANT, BILATERAL Bilateral 2002-2007   "left-right"   COLONOSCOPY     COLONOSCOPY WITH PROPOFOL N/A 03/12/2022   Procedure: COLONOSCOPY WITH PROPOFOL;  Surgeon: Corbin Ade, MD;  [facility];  Service: Endoscopy;  Laterality: N/A;   DILATION AND CURETTAGE OF UTERUS  1985   "prior to hysterectomy"   FRACTURE SURGERY     JOINT REPLACEMENT     NASAL FRACTURE SURGERY  2016   POLYPECTOMY  03/12/2022   Procedure: POLYPECTOMY;  Surgeon: Corbin Ade, MD;  [facility];  Service: Endoscopy;;   REPLACEMENT TOTAL KNEE Right    TONSILLECTOMY     TOTAL HIP ARTHROPLASTY Right 07/04/2016   TOTAL HIP ARTHROPLASTY Right 07/04/2016   Procedure: TOTAL HIP ARTHROPLASTY ANTERIOR APPROACH;  Surgeon: Gean Birchwood, MD;  Location: [facility] OR;  Service: Orthopedics;  Laterality: Right;   TOTAL KNEE ARTHROPLASTY Right 05/10/2017   Procedure: RIGHT TOTAL KNEE ARTHROPLASTY;  Surgeon: Gean Birchwood, MD;  Location: [facility] OR;  Service: Orthopedics;  Laterality: Right;     A IV Location/Drains/Wounds Patient Lines/Drains/Airways Status     Active Line/Drains/Airways     Name Placement  date Placement time Site Days   Peripheral IV 09/26/22 20 G Left Antecubital 09/26/22  2312  Antecubital  1   Peripheral IV 09/27/22 22 G 1" Left Forearm 09/27/22  0152  Forearm  less than 1            Intake/Output Last 24 hours No intake or output data in the 24 hours ending 09/27/22 0521  Labs/Imaging Results for orders placed or performed during the hospital encounter of 09/26/22 (from the past 48 hour(s))  CBC     Status: Abnormal   Collection Time: 09/26/22 10:38 PM  Result Value Ref  Range   WBC 6.5 4.0 - 10.5 K/uL   RBC 3.14 (L) 3.87 - 5.11 MIL/uL   Hemoglobin 9.5 (L) 12.0 - 15.0 g/dL   HCT 70.6 (L) 23.7 - 62.8 %   MCV 93.0 80.0 - 100.0 fL   MCH 30.3 26.0 - 34.0 pg   MCHC 32.5 30.0 - 36.0 g/dL   RDW 31.5 17.6 - 16.0 %   Platelets 138 (L) 150 - 400 K/uL    Comment: SPECIMEN CHECKED FOR CLOTS REPEATED TO VERIFY    nRBC 0.0 0.0 - 0.2 %    Comment: Performed at [facility], [address], [place], [place] [address]  Comprehensive metabolic panel     Status: Abnormal   Collection Time: 09/26/22 10:38 PM  Result Value Ref Range   Sodium 134 (L) 135 - 145 mmol/L   Potassium 4.2 3.5 - 5.1 mmol/L   Chloride 103 98 - 111 mmol/L   CO2 20 (L) 22 - 32 mmol/L   Glucose, Bld 131 (H) 70 - 99 mg/dL    Comment: Glucose reference range applies only to samples taken after fasting for at least 8 hours.   BUN 15 8 - 23 mg/dL   Creatinine, Ser 6.26 0.44 - 1.00 mg/dL   Calcium 9.5 8.9 - 94.8 mg/dL   Total Protein 7.7 6.5 - 8.1 g/dL   Albumin 3.7 3.5 - 5.0 g/dL   AST 21 15 - 41 U/L   ALT 12 0 - 44 U/L   Alkaline Phosphatase 85 38 - 126 U/L   Total Bilirubin 1.5 (H) 0.3 - 1.2 mg/dL   GFR, Estimated >54 >62 mL/min    Comment: (NOTE) Calculated using the CKD-EPI Creatinine Equation (2021)    Anion gap 11 5 - 15    Comment: Performed at [facility], [address] [place], [place] [address]  Urinalysis, Routine w reflex microscopic -Urine, Catheterized     Status: Abnormal   Collection Time: 09/27/22 12:18 AM  Result Value Ref Range   Color, Urine YELLOW YELLOW   APPearance CLEAR CLEAR   Specific Gravity, Urine 1.011 1.005 - 1.030   pH 7.0 5.0 - 8.0   Glucose, UA NEGATIVE NEGATIVE mg/dL   Hgb urine dipstick SMALL (A) NEGATIVE   Bilirubin Urine NEGATIVE NEGATIVE   Ketones, ur 20 (A) NEGATIVE mg/dL   Protein, ur 30 (A) NEGATIVE mg/dL   Nitrite NEGATIVE NEGATIVE   Leukocytes,Ua NEGATIVE NEGATIVE   RBC / HPF 6-10 0 - 5 RBC/hpf   WBC, UA 6-10 0 - 5 WBC/hpf   Bacteria, UA  NONE SEEN NONE SEEN   Squamous Epithelial / HPF 0-5 0 - 5 /HPF   Mucus PRESENT     Comment: Performed at [name], [address], [place], [place] [address]   DG HIP Alvera Novel W OR W/O PELVIS 2-3 VIEWS RIGHT  Result Date: 09/26/2022 CLINICAL DATA:  Recent fall with right hip pain, initial encounter EXAM: DG HIP (WITH OR WITHOUT PELVIS) 3V RIGHT COMPARISON:  03/12/2022 FINDINGS: Right hip prosthesis is noted in satisfactory position. Healed fracture in the inferior pubic ramus adjacent to the pubic symphysis is noted similar to that seen on prior CT. No acute fracture is noted. IMPRESSION: No acute abnormality noted. Electronically Signed   By: Alcide Clever M.D.   On: 09/26/2022 23:54   CT Lumbar Spine Wo Contrast  Result Date: 09/26/2022 CLINICAL DATA:  Fall, low back pain EXAM: CT LUMBAR SPINE WITHOUT CONTRAST TECHNIQUE: Multidetector CT imaging of the lumbar spine was performed without intravenous contrast administration. Multiplanar CT image reconstructions were also generated. RADIATION DOSE REDUCTION: This exam was performed according to the departmental dose-optimization program which includes automated exposure control, adjustment of the mA and/or kV according to patient size and/or use of iterative reconstruction technique. COMPARISON:  CT abdomen/pelvis dated 03/12/2022 FINDINGS: Segmentation: 5 lumbar type vertebral bodies. Alignment: Normal lumbar lordosis. Vertebrae: Moderate superior endplate compression fracture deformity at L5, with approximately 40% loss of height (sagittal image 32). No retropulsion. While this appearance may be subacute or chronic, it is new from January 2024. Vertebral body heights are otherwise maintained. Paraspinal and other soft tissues: Negative. Disc levels: Mild degenerative changes of the lumbar spine, most prominent at L1-2 and L5-S1. Spinal canal is patent. IMPRESSION: Moderate superior endplate compression fracture deformity at L5, as above. While possibly  subacute or chronic, this is new from January 2024. Otherwise, no traumatic injury to the lumbar spine. Mild degenerative changes. Electronically Signed   By: Charline Bills M.D.   On: 09/26/2022 23:12   CT Head Wo Contrast  Result Date: 09/26/2022 CLINICAL DATA:  Recent fall with headaches and neck pain, initial encounter EXAM: CT HEAD WITHOUT CONTRAST CT CERVICAL SPINE WITHOUT CONTRAST TECHNIQUE: Multidetector CT imaging of the head and cervical spine was performed following the standard protocol without intravenous contrast. Multiplanar CT image reconstructions of the cervical spine were also generated. RADIATION DOSE REDUCTION: This exam was performed according to the departmental dose-optimization program which includes automated exposure control, adjustment of the mA and/or kV according to patient size and/or use of iterative reconstruction technique. COMPARISON:  03/09/2022 FINDINGS: CT HEAD FINDINGS Brain: No evidence of acute infarction, hemorrhage, hydrocephalus, extra-axial collection or mass lesion/mass effect. Chronic atrophic and ischemic changes are noted. Vascular: No hyperdense vessel or unexpected calcification. Skull: Normal. Negative for fracture or focal lesion. Sinuses/Orbits: No acute finding. Other: None. CT CERVICAL SPINE FINDINGS Alignment: Within normal limits. Skull base and vertebrae: Cervical segments are well visualized. Vertebral body height is maintained. Multilevel disc space narrowing is seen. Mild osteophytic changes and facet hypertrophic changes are noted. No acute fracture or acute facet abnormality is noted. Soft tissues and spinal canal: Surrounding soft tissue structures are within normal limits. Vascular calcifications are seen. Right chest wall port is noted. Upper chest: Lung apices demonstrate pleural and parenchymal scarring bilaterally. Other: None IMPRESSION: CT of the head: Chronic atrophic and ischemic changes without acute abnormality. CT of cervical spine:  Multilevel degenerative change without acute abnormality. Electronically Signed   By: Alcide Clever M.D.   On: 09/26/2022 23:10   CT Cervical Spine Wo Contrast  Result Date: 09/26/2022 CLINICAL DATA:  Recent fall with headaches and neck pain, initial encounter EXAM: CT HEAD WITHOUT CONTRAST CT CERVICAL SPINE WITHOUT CONTRAST TECHNIQUE: Multidetector CT imaging of the head and cervical spine was performed following the standard protocol without intravenous contrast. Multiplanar  CT image reconstructions of the cervical spine were also generated. RADIATION DOSE REDUCTION: This exam was performed according to the departmental dose-optimization program which includes automated exposure control, adjustment of the mA and/or kV according to patient size and/or use of iterative reconstruction technique. COMPARISON:  03/09/2022 FINDINGS: CT HEAD FINDINGS Brain: No evidence of acute infarction, hemorrhage, hydrocephalus, extra-axial collection or mass lesion/mass effect. Chronic atrophic and ischemic changes are noted. Vascular: No hyperdense vessel or unexpected calcification. Skull: Normal. Negative for fracture or focal lesion. Sinuses/Orbits: No acute finding. Other: None. CT CERVICAL SPINE FINDINGS Alignment: Within normal limits. Skull base and vertebrae: Cervical segments are well visualized. Vertebral body height is maintained. Multilevel disc space narrowing is seen. Mild osteophytic changes and facet hypertrophic changes are noted. No acute fracture or acute facet abnormality is noted. Soft tissues and spinal canal: Surrounding soft tissue structures are within normal limits. Vascular calcifications are seen. Right chest wall port is noted. Upper chest: Lung apices demonstrate pleural and parenchymal scarring bilaterally. Other: None IMPRESSION: CT of the head: Chronic atrophic and ischemic changes without acute abnormality. CT of cervical spine: Multilevel degenerative change without acute abnormality.  Electronically Signed   By: Alcide Clever M.D.   On: 09/26/2022 23:10    Pending Labs Unresulted Labs (From admission, onward)     Start     Ordered   09/27/22 0500  Basic metabolic panel  Daily,   R      09/27/22 0006   09/27/22 0500  Magnesium  Tomorrow morning,   R        09/27/22 0006   09/27/22 0500  Hepatic function panel  Tomorrow morning,   R        09/27/22 0006   09/27/22 0500  CBC  Daily,   R      09/27/22 0006   09/27/22 0500  Vitamin B12  (Anemia Panel (PNL))  Tomorrow morning,   R        09/27/22 0006   09/27/22 0500  Folate  (Anemia Panel (PNL))  Tomorrow morning,   R        09/27/22 0006   09/27/22 0500  Iron and TIBC  (Anemia Panel (PNL))  Tomorrow morning,   R        09/27/22 0006   09/27/22 0500  Ferritin  (Anemia Panel (PNL))  Tomorrow morning,   R        09/27/22 0006   09/27/22 0500  Reticulocytes  (Anemia Panel (PNL))  Tomorrow morning,   R        09/27/22 0006            Vitals/Pain Today's Vitals   09/27/22 0415 09/27/22 0436 09/27/22 0445 09/27/22 0500  BP:    (!) 140/74  Pulse: 88  79 87  Resp: 14  17 16   Temp:  99.2 F (37.3 C)    TempSrc:  Oral    SpO2: 95%  92% 93%  Weight:      Height:      PainSc:        Isolation Precautions No active isolations  Medications Medications  diltiazem (CARDIZEM) 1 mg/mL load via infusion 10 mg (10 mg Intravenous Bolus from Bag 09/26/22 2312)    And  diltiazem (CARDIZEM) 125 mg in dextrose 5% 125 mL (1 mg/mL) infusion (10 mg/hr Intravenous Rate/Dose Change 09/26/22 2353)  metoprolol tartrate (LOPRESSOR) tablet 25 mg (25 mg Oral Not Given 09/27/22 0055)  apixaban (ELIQUIS) tablet 5 mg (5 mg Oral  Given 09/27/22 0116)  sodium chloride flush (NS) 0.9 % injection 3 mL (3 mLs Intravenous Not Given 09/27/22 0022)  acetaminophen (TYLENOL) tablet 650 mg (has no administration in time range)    Or  acetaminophen (TYLENOL) suppository 650 mg (has no administration in time range)  traMADol (ULTRAM) tablet 50 mg  (has no administration in time range)  fentaNYL (SUBLIMAZE) injection 12.5-25 mcg (has no administration in time range)  methocarbamol (ROBAXIN) 500 mg in dextrose 5 % 50 mL IVPB (has no administration in time range)  polyethylene glycol (MIRALAX / GLYCOLAX) packet 17 g (has no administration in time range)  bisacodyl (DULCOLAX) EC tablet 5 mg (has no administration in time range)  ondansetron (ZOFRAN) tablet 4 mg (has no administration in time range)    Or  ondansetron (ZOFRAN) injection 4 mg (has no administration in time range)  metoprolol tartrate (LOPRESSOR) injection 2.5 mg (has no administration in time range)  lactated ringers bolus 1,000 mL (0 mLs Intravenous Stopped 09/27/22 0220)  morphine (PF) 2 MG/ML injection 2 mg (2 mg Intravenous Given 09/26/22 2352)  ondansetron (ZOFRAN) injection 4 mg (4 mg Intravenous Given 09/26/22 2352)    Mobility walks with person assist     Focused Assessments Cardiac Assessment Handoff:  Cardiac Rhythm: Atrial fibrillation No results found for: "CKTOTAL", "CKMB", "CKMBINDEX", "TROPONINI" No results found for: "DDIMER" Does the Patient currently have chest pain? No    R Recommendations: See Admitting Provider Note  Report given to:   Additional Notes: -

## 2022-09-27 NOTE — TOC Initial Note (Addendum)
Transition of Care [facility]) - Initial/Assessment Note    Patient Details  Name: Bailey Wilson MRN: [id number] Date of Birth: Nov 07, 1940  Transition of Care [facility]) CM/SW Contact:    Karn Cassis, LCSW Phone Number: 09/27/2022, 12:46 PM  Clinical Narrative: LCSW met with pt, pt's son, and daughter-in-law at bedside. Pt reports she lives in [place] with her daughter, Bailey Wilson but returns to [place] for a week every few months. Pt fell while here and has lumbar compression fracture. At baseline, pt is fairly independent and ambulates with walker. PT evaluated pt and recommend SNF. Pt/son defer to [employer]. LCSW spoke with Bailey Wilson on phone in room. She is aware of recommendation for SNF. Bailey Wilson states she plans to take pt home with her to [place] and will be here tomorrow. She indicates pt was active with [facility]. LCSW has contact Christian with [facility] to determine what HH orders are needed. TOC will continue to follow.                  Update: Pt d/c from [facility] services earlier this month. Can accept back for HHPT/OT/RN. Will need orders prior to d/c.   Expected Discharge Plan: Home w Home Health Services Barriers to Discharge: Continued Medical Work up   Patient Goals and CMS Choice Patient states their goals for this hospitalization and ongoing recovery are:: return home   Choice offered to / list presented to : Patient, Adult Children [place] ownership interest in [name].provided to::  (declined SNF)    Expected Discharge Plan and Services In-house Referral: Clinical Social Work   Post Acute Care Choice: Resumption of Svcs/PTA Provider Living arrangements for the past 2 months: Single Family Home                                      Prior Living Arrangements/Services Living arrangements for the past 2 months: Single Family Home Lives with:: Adult Children Patient language and need for interpreter reviewed:: Yes Do you feel safe going  back to the place where you live?: Yes      Need for Family Participation in Patient Care: Yes (Comment) Care giver support system in place?: Yes (comment) Current home services: DME (walker, cane) Criminal Activity/Legal Involvement Pertinent to Current Situation/Hospitalization: No - Comment as needed  Activities of Daily Living      Permission Sought/Granted                  Emotional Assessment       Orientation: : Oriented to Self, Oriented to Place, Oriented to  Time Alcohol / Substance Use: Not Applicable Psych Involvement: No (comment)  Admission diagnosis:  Thrombocytopenia (HCC) [D69.6] Noncompliance with medication regimen [Z91.148] Chronic anemia [D64.9] Atrial fibrillation with rapid ventricular response (HCC) [I48.91] Generalized weakness [R53.1] Atrial fibrillation with RVR (HCC) [I48.91] Fall from slip, trip, or stumble, initial encounter [W01.0XXA] Compression fracture of L5 vertebra, initial encounter (HCC) [S32.050A] Patient Active Problem List   Diagnosis Date Noted   Heart failure with mildly reduced ejection fraction (HFmrEF) (HCC) 09/27/2022   Lumbar compression fracture, closed, initial encounter (HCC) 09/26/2022   Normocytic anemia 09/26/2022   Thrombocytopenia (HCC) 09/26/2022   Rectal mass 03/13/2022   Sepsis due to Escherichia coli without acute organ dysfunction (HCC) 03/11/2022   UTI (urinary tract infection) 03/10/2022   Acute metabolic encephalopathy 03/10/2022   Hypercalcemia 03/10/2022   Rectal  bleeding 03/10/2022   AKI (acute kidney injury) (HCC) 03/09/2022   Sepsis due to undetermined organism (HCC) 03/09/2022   RSV infection 03/09/2022   Atrial fibrillation with RVR (HCC) 03/09/2022   Primary osteoarthritis of right knee 05/10/2017   H/O total hip arthroplasty, 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   Arthritis of right hip 07/04/2016   Carotid stenosis 06/03/2016   Murmur 05/06/2016   Essential hypertension 04/20/2016   Environmental and  seasonal allergies    Osteoarthritis of right knee    PCP:  Assunta Found, MD Pharmacy:   [facility] [phone] - [place], [place] - [address] AT [facility] [address] [place] [address] Phone: [phone] Fax: [phone]     Social Determinants of Health (SDOH) Social History: SDOH Screenings   Food Insecurity: Patient Unable To Answer (09/27/2022)  Housing: Patient Unable To Answer (09/27/2022)  Transportation Needs: Patient Unable To Answer (09/27/2022)  Utilities: Patient Unable To Answer (09/27/2022)  Financial Resource Strain: Low Risk  (09/12/2022)   Received from [facility]  Physical Activity: Unknown (07/06/2022)   [facility] from [facility]  Social Connections: Moderately Integrated (07/06/2022)   Received from [facility]  Stress: No Stress Concern Present (07/06/2022)   Received from [name]  Tobacco Use: Low Risk  (09/27/2022)   SDOH Interventions:     Readmission Risk Interventions     No data to display

## 2022-09-27 NOTE — H&P (Addendum)
History and Physical    Bailey Wilson ZOX:[id number] DOB: October 23, 1940 DOA: 09/26/2022  PCP: Assunta Found, MD   Patient coming from: Home   Chief Complaint: Back pain after a fall   HPI: Bailey Wilson is a pleasant 82 y.o. female with medical history significant for anal cancer status post chemo and radiation, atrial fibrillation on Eliquis, chronic HFmrEF, hypertension, and iron deficiency anemia who presents to the ED with back pain after a fall at home.  Patient had a ground-level fall at the foot of her bed today and has been complaining of back pain.  She denies hitting her head and does not believe that she lost consciousness.  Her family notes that she had been complaining of back pain a few days ago as well.  Family also notes that she has not been taking her medications regularly in the past couple weeks.  Patient reports pain in her mid back but denies any other pain and has no other complaints in the ED.  ED Course: Upon arrival to the ED, patient is found to be afebrile and saturating low 90s on room air with elevated heart rate and stable blood pressure.  EKG demonstrates atrial fibrillation with RVR, rate 130.  No acute findings are noted on CT head or CT cervical spine.  Lumbar spine CT is concerning for L5 superior endplate compression fracture.  Labs are most notable for hemoglobin 9.5 and platelets 138,000.  Patient was given a liter of LR, 10 mg IV diltiazem, morphine, Zofran, and was started on diltiazem infusion.  Review of Systems:  All other systems reviewed and apart from HPI, are negative.  Past Medical History:  Diagnosis Date   Anemia    Carotid stenosis 06/03/2016   1-39% bilateral ICA stenosis 05/2016   Essential hypertension    GERD (gastroesophageal reflux disease)    History of pneumonia    Osteoarthritis    Right knee   Paroxysmal atrial fibrillation (HCC)    Seasonal allergies     Past Surgical History:  Procedure Laterality Date   ABDOMINAL  HYSTERECTOMY  1985   CATARACT EXTRACTION W/ INTRAOCULAR LENS  IMPLANT, BILATERAL Bilateral 2002-2007   "left-right"   COLONOSCOPY     COLONOSCOPY WITH PROPOFOL N/A 03/12/2022   Procedure: COLONOSCOPY WITH PROPOFOL;  Surgeon: Corbin Ade, MD;  Location: [facility];  Service: Endoscopy;  Laterality: N/A;   DILATION AND CURETTAGE OF UTERUS  1985   "prior to hysterectomy"   FRACTURE SURGERY     JOINT REPLACEMENT     NASAL FRACTURE SURGERY  2016   POLYPECTOMY  03/12/2022   Procedure: POLYPECTOMY;  Surgeon: Corbin Ade, MD;  Location: [facility];  Service: Endoscopy;;   REPLACEMENT TOTAL KNEE Right    TONSILLECTOMY     TOTAL HIP ARTHROPLASTY Right 07/04/2016   TOTAL HIP ARTHROPLASTY Right 07/04/2016   Procedure: TOTAL HIP ARTHROPLASTY ANTERIOR APPROACH;  Surgeon: Gean Birchwood, MD;  Location: [facility];  Service: Orthopedics;  Laterality: Right;   TOTAL KNEE ARTHROPLASTY Right 05/10/2017   Procedure: RIGHT TOTAL KNEE ARTHROPLASTY;  Surgeon: Gean Birchwood, MD;  Location: [facility];  Service: Orthopedics;  Laterality: Right;    Social History:   reports that she has never smoked. She has never used smokeless tobacco. She reports that she does not drink alcohol and does not use drugs.  Allergies  Allergen Reactions   Penicillins Hives and Itching    Has patient had a PCN reaction causing immediate rash, facial/tongue/throat  swelling, SOB or lightheadedness with hypotension: YES Has patient had a PCN reaction causing severe rash involving mucus membranes or skin necrosis: No Has patient had a PCN reaction that required hospitalization No Has patient had a PCN reaction occurring within the last 10 years: No If all of the above answers are "NO", then may proceed with Cephalosporin use.    Ace Inhibitors Cough   Aspirin Other (See Comments)    Heart racing (large doses)   Sulfa Antibiotics Itching and Rash    Family History  Problem Relation Age of Onset   Asthma Mother    Arrhythmia  Mother    Heart disease Mother    Heart disease Father    Clotting disorder Father    Hypertension Paternal Grandfather    Blindness Paternal Grandmother      Prior to Admission medications   Medication Sig Start Date End Date Taking? Authorizing Provider  albuterol (PROVENTIL HFA;VENTOLIN HFA) 108 (90 Base) MCG/ACT inhaler Inhale 2 puffs into the lungs every 6 (six) hours as needed for wheezing or shortness of breath.    [provider]  amLODipine (NORVASC) 5 MG tablet TAKE 1 TABLET BY MOUTH EVERY DAY IN THE MORNING 04/18/22   Jonelle Sidle, MD  apixaban (ELIQUIS) 5 MG TABS tablet TAKE 1 TABLET BY MOUTH TWICE A DAY 03/14/22   Jonelle Sidle, MD  ARTIFICIAL TEAR OP Apply 2 drops to eye every morning.     [provider]  AZO-CRANBERRY PO Take 2 tablets by mouth every morning.     [provider]  Calcium 600-200 MG-UNIT tablet Take 1 tablet by mouth daily.    [provider]  calcium carbonate (TUMS - DOSED IN MG ELEMENTAL CALCIUM) 500 MG chewable tablet Chew 1 tablet by mouth daily as needed for indigestion or heartburn.    [provider]  cetirizine (ZYRTEC) 10 MG tablet Take 10 mg by mouth every morning.     [provider]  docusate sodium (COLACE) 100 MG capsule Take 100 mg by mouth daily as needed for mild constipation.     [provider]  hydrochlorothiazide (HYDRODIURIL) 25 MG tablet Take 25 mg by mouth every morning.     [provider]  magnesium oxide (MAG-OX) 400 (240 Mg) MG tablet Take 1 tablet (400 mg total) by mouth daily. 03/14/22   Catarina Hartshorn, MD  metoprolol tartrate (LOPRESSOR) 25 MG tablet TAKE 1 TABLET BY MOUTH TWICE A DAY 06/30/20   Chilton Si, MD  Multiple Vitamins-Minerals (ICAPS MV PO) Take 1 capsule by mouth every morning.     [provider]  nitrofurantoin, macrocrystal-monohydrate, (MACROBID) 100 MG capsule Take 1 capsule (100 mg total) by mouth every 12 (twelve) hours.  03/13/22   Catarina Hartshorn, MD  sodium chloride (OCEAN) 0.65 % SOLN nasal spray Place 1 spray into both nostrils as needed for congestion.    [provider]    Physical Exam: Vitals:   09/26/22 2245 09/26/22 2315 09/26/22 2350 09/27/22 0000  BP: 134/70 121/71 (!) 136/104 132/74  Pulse: (!) 107 (!) 104 (!) 116 (!) 111  Resp: (!) 23 15 20 18   Temp:      TempSrc:      SpO2: 93% 91% 92% 95%  Weight:      Height:        Constitutional: NAD, calm  Eyes: PERTLA, lids and conjunctivae normal ENMT: Mucous membranes are moist. Posterior pharynx clear of any exudate or lesions.   Neck:  supple, no masses  Respiratory: no wheezing, no crackles. No accessory muscle use.  Cardiovascular: Rate ~120 and irregularly irregular.  No JVD. Abdomen: No distension, no tenderness, soft. Bowel sounds active.  Musculoskeletal: no clubbing / cyanosis. No joint deformity upper and lower extremities.   Skin: no significant rashes, lesions, ulcers. Warm, dry, well-perfused. Neurologic: CN 2-12 grossly intact. Moving all extremities. Alert and oriented to person and place.  Psychiatric: Pleasant. Cooperative.    Labs and Imaging on Admission: I have personally reviewed following labs and imaging studies  CBC: Recent Labs  Lab 09/26/22 2238  WBC 6.5  HGB 9.5*  HCT 29.2*  MCV 93.0  PLT 138*   Basic Metabolic Panel: Recent Labs  Lab 09/26/22 2238  NA 134*  K 4.2  CL 103  CO2 20*  GLUCOSE 131*  BUN 15  CREATININE 0.88  CALCIUM 9.5   GFR: Estimated Creatinine Clearance: 47.1 mL/min (by C-G formula based on SCr of 0.88 mg/dL). Liver Function Tests: Recent Labs  Lab 09/26/22 2238  AST 21  ALT 12  ALKPHOS 85  BILITOT 1.5*  PROT 7.7  ALBUMIN 3.7   No results for input(s): "LIPASE", "AMYLASE" in the last 168 hours. No results for input(s): "AMMONIA" in the last 168 hours. Coagulation Profile: No results for input(s): "INR", "PROTIME" in the last 168 hours. Cardiac Enzymes: No  results for input(s): "CKTOTAL", "CKMB", "CKMBINDEX", "TROPONINI" in the last 168 hours. BNP (last 3 results) No results for input(s): "PROBNP" in the last 8760 hours. HbA1C: No results for input(s): "HGBA1C" in the last 72 hours. CBG: No results for input(s): "GLUCAP" in the last 168 hours. Lipid Profile: No results for input(s): "CHOL", "HDL", "LDLCALC", "TRIG", "CHOLHDL", "LDLDIRECT" in the last 72 hours. Thyroid Function Tests: No results for input(s): "TSH", "T4TOTAL", "FREET4", "T3FREE", "THYROIDAB" in the last 72 hours. Anemia Panel: No results for input(s): "VITAMINB12", "FOLATE", "FERRITIN", "TIBC", "IRON", "RETICCTPCT" in the last 72 hours. Urine analysis:    Component Value Date/Time   COLORURINE YELLOW 03/09/2022 1440   APPEARANCEUR HAZY (A) 03/09/2022 1440   LABSPEC 1.013 03/09/2022 1440   PHURINE 5.0 03/09/2022 1440   GLUCOSEU NEGATIVE 03/09/2022 1440   HGBUR SMALL (A) 03/09/2022 1440   BILIRUBINUR NEGATIVE 03/09/2022 1440   KETONESUR 20 (A) 03/09/2022 1440   PROTEINUR 30 (A) 03/09/2022 1440   NITRITE POSITIVE (A) 03/09/2022 1440   LEUKOCYTESUR LARGE (A) 03/09/2022 1440   Sepsis Labs: @LABRCNTIP (procalcitonin:4,lacticidven:4) )No results found for this or any previous visit (from the past 240 hour(s)).   Radiological Exams on Admission: DG HIP UNILAT W OR W/O PELVIS 2-3 VIEWS RIGHT  Result Date: 09/26/2022 CLINICAL DATA:  Recent fall with right hip pain, initial encounter EXAM: DG HIP (WITH OR WITHOUT PELVIS) 3V RIGHT COMPARISON:  03/12/2022 FINDINGS: Right hip prosthesis is noted in satisfactory position. Healed fracture in the inferior pubic ramus adjacent to the pubic symphysis is noted similar to that seen on prior CT. No acute fracture is noted. IMPRESSION: No acute abnormality noted. Electronically Signed   By: Alcide Clever M.D.   On: 09/26/2022 23:54   CT Lumbar Spine Wo Contrast  Result Date: 09/26/2022 CLINICAL DATA:  Fall, low back pain EXAM: CT LUMBAR  SPINE WITHOUT CONTRAST TECHNIQUE: Multidetector CT imaging of the lumbar spine was performed without intravenous contrast administration. Multiplanar CT image reconstructions were also generated. RADIATION DOSE REDUCTION: This exam was performed according to the departmental dose-optimization program which includes automated exposure control, adjustment of the mA and/or kV according  to patient size and/or use of iterative reconstruction technique. COMPARISON:  CT abdomen/pelvis dated 03/12/2022 FINDINGS: Segmentation: 5 lumbar type vertebral bodies. Alignment: Normal lumbar lordosis. Vertebrae: Moderate superior endplate compression fracture deformity at L5, with approximately 40% loss of height (sagittal image 32). No retropulsion. While this appearance may be subacute or chronic, it is new from January 2024. Vertebral body heights are otherwise maintained. Paraspinal and other soft tissues: Negative. Disc levels: Mild degenerative changes of the lumbar spine, most prominent at L1-2 and L5-S1. Spinal canal is patent. IMPRESSION: Moderate superior endplate compression fracture deformity at L5, as above. While possibly subacute or chronic, this is new from January 2024. Otherwise, no traumatic injury to the lumbar spine. Mild degenerative changes. Electronically Signed   By: Charline Bills M.D.   On: 09/26/2022 23:12   CT Head Wo Contrast  Result Date: 09/26/2022 CLINICAL DATA:  Recent fall with headaches and neck pain, initial encounter EXAM: CT HEAD WITHOUT CONTRAST CT CERVICAL SPINE WITHOUT CONTRAST TECHNIQUE: Multidetector CT imaging of the head and cervical spine was performed following the standard protocol without intravenous contrast. Multiplanar CT image reconstructions of the cervical spine were also generated. RADIATION DOSE REDUCTION: This exam was performed according to the departmental dose-optimization program which includes automated exposure control, adjustment of the mA and/or kV according to  patient size and/or use of iterative reconstruction technique. COMPARISON:  03/09/2022 FINDINGS: CT HEAD FINDINGS Brain: No evidence of acute infarction, hemorrhage, hydrocephalus, extra-axial collection or mass lesion/mass effect. Chronic atrophic and ischemic changes are noted. Vascular: No hyperdense vessel or unexpected calcification. Skull: Normal. Negative for fracture or focal lesion. Sinuses/Orbits: No acute finding. Other: None. CT CERVICAL SPINE FINDINGS Alignment: Within normal limits. Skull base and vertebrae: Cervical segments are well visualized. Vertebral body height is maintained. Multilevel disc space narrowing is seen. Mild osteophytic changes and facet hypertrophic changes are noted. No acute fracture or acute facet abnormality is noted. Soft tissues and spinal canal: Surrounding soft tissue structures are within normal limits. Vascular calcifications are seen. Right chest wall port is noted. Upper chest: Lung apices demonstrate pleural and parenchymal scarring bilaterally. Other: None IMPRESSION: CT of the head: Chronic atrophic and ischemic changes without acute abnormality. CT of cervical spine: Multilevel degenerative change without acute abnormality. Electronically Signed   By: Alcide Clever M.D.   On: 09/26/2022 23:10   CT Cervical Spine Wo Contrast  Result Date: 09/26/2022 CLINICAL DATA:  Recent fall with headaches and neck pain, initial encounter EXAM: CT HEAD WITHOUT CONTRAST CT CERVICAL SPINE WITHOUT CONTRAST TECHNIQUE: Multidetector CT imaging of the head and cervical spine was performed following the standard protocol without intravenous contrast. Multiplanar CT image reconstructions of the cervical spine were also generated. RADIATION DOSE REDUCTION: This exam was performed according to the departmental dose-optimization program which includes automated exposure control, adjustment of the mA and/or kV according to patient size and/or use of iterative reconstruction technique.  COMPARISON:  03/09/2022 FINDINGS: CT HEAD FINDINGS Brain: No evidence of acute infarction, hemorrhage, hydrocephalus, extra-axial collection or mass lesion/mass effect. Chronic atrophic and ischemic changes are noted. Vascular: No hyperdense vessel or unexpected calcification. Skull: Normal. Negative for fracture or focal lesion. Sinuses/Orbits: No acute finding. Other: None. CT CERVICAL SPINE FINDINGS Alignment: Within normal limits. Skull base and vertebrae: Cervical segments are well visualized. Vertebral body height is maintained. Multilevel disc space narrowing is seen. Mild osteophytic changes and facet hypertrophic changes are noted. No acute fracture or acute facet abnormality is noted. Soft tissues and spinal canal:  Surrounding soft tissue structures are within normal limits. Vascular calcifications are seen. Right chest wall port is noted. Upper chest: Lung apices demonstrate pleural and parenchymal scarring bilaterally. Other: None IMPRESSION: CT of the head: Chronic atrophic and ischemic changes without acute abnormality. CT of cervical spine: Multilevel degenerative change without acute abnormality. Electronically Signed   By: Alcide Clever M.D.   On: 09/26/2022 23:10    EKG: Independently reviewed. Atrial fibrillation, rate 130.   Assessment/Plan   1. Atrial fibrillation with RVR  - In setting of recent medication non-compliance and acute pain from lumbar compression fracture  - Rate improved with IV diltiazem in ED  - Resume Eliquis, resume oral Lopressor now, use IV Lopressor as needed, and titrate off diltiazem infusion    2. Lumbar compression fracture  - Compression fx involving superior endplate of L5 noted on CT  - Neurologically intact  - Continue pain-control, consult PT    3. Hypertension  - Hold Norvasc for now to allow more room for rate-control medications   4. Chronic HFmrEF - EF was 45% on echo from February 2024  - Appears compensated - Limit fluids, continue  beta-blocker, monitor volume status   5. Anemia; thrombocytopenia  - Appears chronic and stable based on values in Care Everywhere   DVT prophylaxis: Eliquis  Code Status: Full  Level of Care: Level of care: Stepdown Family Communication: Son and daughter-in-law at bedside  Disposition Plan:  Patient is from: Home  Anticipated d/c is to: TBD Anticipated d/c date is: 09/29/22  Patient currently: Pending rate-control, pain-control, PT eval  Consults called: None  Admission status: Inpatient     Briscoe Deutscher, MD [facility]  09/27/2022, 12:12 AM

## 2022-09-27 NOTE — ED Notes (Signed)
RN responded to pts alarms. Found pt standing at the edge of the bed. Pt sts she needed to go to the bathroom. Pt previously assisted onto bed pan x2 informed of use of call bell. No family at bedside. Pt informed she needs to use call bell if she needs assistance going to the bathroom. Pt assisted with using bedside commode. Pt is unsteady. Was repositioned in bed and placed on posey sitter alarm. Pt verbalized understanding of use of call bell.

## 2022-09-27 NOTE — ED Notes (Signed)
Pt could not urinate enough for sample. Only urinated a couple drops and stated they could not go any more.

## 2022-09-27 NOTE — Progress Notes (Signed)
PROGRESS NOTE    Bailey Wilson  WUJ:[id number] DOB: 12/13/1940 DOA: 09/26/2022 PCP: Assunta Found, MD   Brief Narrative:    Bailey Wilson is a pleasant 82 y.o. female with medical history significant for anal cancer status post chemo and radiation, atrial fibrillation on Eliquis, chronic HFmrEF, hypertension, and iron deficiency anemia who presents to the ED with back pain after a fall at home.  She was noted to have some associated atrial fibrillation with RVR which has now resolved.  Assessment & Plan:   Principal Problem:   Atrial fibrillation with RVR (HCC) Active Problems:   Essential hypertension   Lumbar compression fracture, closed, initial encounter (HCC)   Normocytic anemia   Thrombocytopenia (HCC)   Heart failure with mildly reduced ejection fraction (HFmrEF) (HCC)  Assessment and Plan:   1. Atrial fibrillation with RVR  - In setting of recent medication non-compliance and acute pain from lumbar compression fracture  - Rate improved with IV diltiazem which has now been weaned off.  Continue metoprolol - Resume Eliquis, resume oral Lopressor now, use IV Lopressor as needed -Okay for transfer to telemetry   2. Lumbar compression fracture  - Compression fx involving superior endplate of L5 noted on CT  - Neurologically intact  - Continue pain-control -PT recommending SNF/rehab   3. Hypertension  - Hold Norvasc for now to allow more room for rate-control medications    4. Chronic HFmrEF - EF was 45% on echo from February 2024  - Appears compensated - Limit fluids, continue beta-blocker, monitor volume status    5. Anemia; thrombocytopenia  - Appears chronic and stable based on values in Care Everywhere     DVT prophylaxis:Eliquis Code Status: Full Family Communication: Son at bedside 8/13 Disposition Plan: Plan for SNF Status is: Inpatient Remains inpatient appropriate because: Need for IV medications and placement.   Consultants:   None  Procedures:  None  Antimicrobials:  None   Subjective: Patient seen and evaluated today with no new acute complaints or concerns. No acute concerns or events noted overnight.  She denies any significant low back pain.  Heart rates are improved this morning.  Objective: Vitals:   09/27/22 0436 09/27/22 0445 09/27/22 0500 09/27/22 0605  BP:   (!) 140/74 (!) 131/50  Pulse:  79 87 82  Resp:  17 16 18   Temp: 99.2 F (37.3 C)   98.3 F (36.8 C)  TempSrc: Oral   Oral  SpO2:  92% 93% 94%  Weight:      Height:        Intake/Output Summary (Last 24 hours) at 09/27/2022 0721 Last data filed at 09/27/2022 9562 Gross per 24 hour  Intake 69.65 ml  Output --  Net 69.65 ml   Filed Weights   09/26/22 2202  Weight: 63.5 kg    Examination:  General exam: Appears calm and comfortable  Respiratory system: Clear to auscultation. Respiratory effort normal. Cardiovascular system: S1 & S2 heard, irregular. Gastrointestinal system: Abdomen is soft Central nervous system: Alert and awake Extremities: No edema Skin: No significant lesions noted Psychiatry: Flat affect.    Data Reviewed: I have personally reviewed following labs and imaging studies  CBC: Recent Labs  Lab 09/26/22 2238 09/27/22 0540  WBC 6.5 6.6  HGB 9.5* 10.4*  HCT 29.2* 33.4*  MCV 93.0 97.7  PLT 138* 126*   Basic Metabolic Panel: Recent Labs  Lab 09/26/22 2238 09/27/22 0540  NA 134* 134*  K 4.2 3.8  CL 103 103  CO2 20* 22  GLUCOSE 131* 141*  BUN 15 15  CREATININE 0.88 0.89  CALCIUM 9.5 9.4  MG  --  1.7   GFR: Estimated Creatinine Clearance: 46.6 mL/min (by C-G formula based on SCr of 0.89 mg/dL). Liver Function Tests: Recent Labs  Lab 09/26/22 2238 09/27/22 0540  AST 21 20  ALT 12 13  ALKPHOS 85 83  BILITOT 1.5* 1.0  PROT 7.7 7.7  ALBUMIN 3.7 3.7   No results for input(s): "LIPASE", "AMYLASE" in the last 168 hours. No results for input(s): "AMMONIA" in the last 168  hours. Coagulation Profile: No results for input(s): "INR", "PROTIME" in the last 168 hours. Cardiac Enzymes: No results for input(s): "CKTOTAL", "CKMB", "CKMBINDEX", "TROPONINI" in the last 168 hours. BNP (last 3 results) No results for input(s): "PROBNP" in the last 8760 hours. HbA1C: No results for input(s): "HGBA1C" in the last 72 hours. CBG: No results for input(s): "GLUCAP" in the last 168 hours. Lipid Profile: No results for input(s): "CHOL", "HDL", "LDLCALC", "TRIG", "CHOLHDL", "LDLDIRECT" in the last 72 hours. Thyroid Function Tests: No results for input(s): "TSH", "T4TOTAL", "FREET4", "T3FREE", "THYROIDAB" in the last 72 hours. Anemia Panel: Recent Labs    09/27/22 0354 09/27/22 0540  VITAMINB12 166*  --   FOLATE  --  10.2  FERRITIN 338*  --   TIBC 232*  --   IRON 44  --   RETICCTPCT  --  2.0   Sepsis Labs: No results for input(s): "PROCALCITON", "LATICACIDVEN" in the last 168 hours.  No results found for this or any previous visit (from the past 240 hour(s)).       Radiology Studies: DG HIP UNILAT W OR W/O PELVIS 2-3 VIEWS RIGHT  Result Date: 09/26/2022 CLINICAL DATA:  Recent fall with right hip pain, initial encounter EXAM: DG HIP (WITH OR WITHOUT PELVIS) 3V RIGHT COMPARISON:  03/12/2022 FINDINGS: Right hip prosthesis is noted in satisfactory position. Healed fracture in the inferior pubic ramus adjacent to the pubic symphysis is noted similar to that seen on prior CT. No acute fracture is noted. IMPRESSION: No acute abnormality noted. Electronically Signed   By: Alcide Clever M.D.   On: 09/26/2022 23:54   CT Lumbar Spine Wo Contrast  Result Date: 09/26/2022 CLINICAL DATA:  Fall, low back pain EXAM: CT LUMBAR SPINE WITHOUT CONTRAST TECHNIQUE: Multidetector CT imaging of the lumbar spine was performed without intravenous contrast administration. Multiplanar CT image reconstructions were also generated. RADIATION DOSE REDUCTION: This exam was performed according to  the departmental dose-optimization program which includes automated exposure control, adjustment of the mA and/or kV according to patient size and/or use of iterative reconstruction technique. COMPARISON:  CT abdomen/pelvis dated 03/12/2022 FINDINGS: Segmentation: 5 lumbar type vertebral bodies. Alignment: Normal lumbar lordosis. Vertebrae: Moderate superior endplate compression fracture deformity at L5, with approximately 40% loss of height (sagittal image 32). No retropulsion. While this appearance may be subacute or chronic, it is new from January 2024. Vertebral body heights are otherwise maintained. Paraspinal and other soft tissues: Negative. Disc levels: Mild degenerative changes of the lumbar spine, most prominent at L1-2 and L5-S1. Spinal canal is patent. IMPRESSION: Moderate superior endplate compression fracture deformity at L5, as above. While possibly subacute or chronic, this is new from January 2024. Otherwise, no traumatic injury to the lumbar spine. Mild degenerative changes. Electronically Signed   By: Charline Bills M.D.   On: 09/26/2022 23:12   CT Head Wo Contrast  Result Date: 09/26/2022 CLINICAL DATA:  Recent fall with  headaches and neck pain, initial encounter EXAM: CT HEAD WITHOUT CONTRAST CT CERVICAL SPINE WITHOUT CONTRAST TECHNIQUE: Multidetector CT imaging of the head and cervical spine was performed following the standard protocol without intravenous contrast. Multiplanar CT image reconstructions of the cervical spine were also generated. RADIATION DOSE REDUCTION: This exam was performed according to the departmental dose-optimization program which includes automated exposure control, adjustment of the mA and/or kV according to patient size and/or use of iterative reconstruction technique. COMPARISON:  03/09/2022 FINDINGS: CT HEAD FINDINGS Brain: No evidence of acute infarction, hemorrhage, hydrocephalus, extra-axial collection or mass lesion/mass effect. Chronic atrophic and  ischemic changes are noted. Vascular: No hyperdense vessel or unexpected calcification. Skull: Normal. Negative for fracture or focal lesion. Sinuses/Orbits: No acute finding. Other: None. CT CERVICAL SPINE FINDINGS Alignment: Within normal limits. Skull base and vertebrae: Cervical segments are well visualized. Vertebral body height is maintained. Multilevel disc space narrowing is seen. Mild osteophytic changes and facet hypertrophic changes are noted. No acute fracture or acute facet abnormality is noted. Soft tissues and spinal canal: Surrounding soft tissue structures are within normal limits. Vascular calcifications are seen. Right chest wall port is noted. Upper chest: Lung apices demonstrate pleural and parenchymal scarring bilaterally. Other: None IMPRESSION: CT of the head: Chronic atrophic and ischemic changes without acute abnormality. CT of cervical spine: Multilevel degenerative change without acute abnormality. Electronically Signed   By: Alcide Clever M.D.   On: 09/26/2022 23:10   CT Cervical Spine Wo Contrast  Result Date: 09/26/2022 CLINICAL DATA:  Recent fall with headaches and neck pain, initial encounter EXAM: CT HEAD WITHOUT CONTRAST CT CERVICAL SPINE WITHOUT CONTRAST TECHNIQUE: Multidetector CT imaging of the head and cervical spine was performed following the standard protocol without intravenous contrast. Multiplanar CT image reconstructions of the cervical spine were also generated. RADIATION DOSE REDUCTION: This exam was performed according to the departmental dose-optimization program which includes automated exposure control, adjustment of the mA and/or kV according to patient size and/or use of iterative reconstruction technique. COMPARISON:  03/09/2022 FINDINGS: CT HEAD FINDINGS Brain: No evidence of acute infarction, hemorrhage, hydrocephalus, extra-axial collection or mass lesion/mass effect. Chronic atrophic and ischemic changes are noted. Vascular: No hyperdense vessel or  unexpected calcification. Skull: Normal. Negative for fracture or focal lesion. Sinuses/Orbits: No acute finding. Other: None. CT CERVICAL SPINE FINDINGS Alignment: Within normal limits. Skull base and vertebrae: Cervical segments are well visualized. Vertebral body height is maintained. Multilevel disc space narrowing is seen. Mild osteophytic changes and facet hypertrophic changes are noted. No acute fracture or acute facet abnormality is noted. Soft tissues and spinal canal: Surrounding soft tissue structures are within normal limits. Vascular calcifications are seen. Right chest wall port is noted. Upper chest: Lung apices demonstrate pleural and parenchymal scarring bilaterally. Other: None IMPRESSION: CT of the head: Chronic atrophic and ischemic changes without acute abnormality. CT of cervical spine: Multilevel degenerative change without acute abnormality. Electronically Signed   By: Alcide Clever M.D.   On: 09/26/2022 23:10        Scheduled Meds:  apixaban  5 mg Oral BID   Chlorhexidine Gluconate Cloth  6 each Topical Daily   metoprolol tartrate  25 mg Oral BID   sodium chloride flush  3 mL Intravenous Q12H   Continuous Infusions:  diltiazem (CARDIZEM) infusion 10 mg/hr (09/27/22 0605)   methocarbamol (ROBAXIN) IV       LOS: 1 day    Time spent: 35 minutes     Hoover Brunette, DO [facility]  If 7PM-7AM, please contact night-coverage [email or web address] 09/27/2022, 7:21 AM

## 2022-09-27 NOTE — Plan of Care (Signed)
  Problem: Acute Rehab PT Goals(only PT should resolve) Goal: Pt Will Go Supine/Side To Sit 09/27/2022 1007 by Belva Chimes, Student-PT Outcome: Progressing Flowsheets (Taken 09/27/2022 1007) Pt will go Supine/Side to Sit: with minimal assist 09/27/2022 1006 by Belva Chimes, Student-PT Outcome: Progressing Flowsheets (Taken 09/27/2022 1006) Pt will go Supine/Side to Sit: with minimal assist Goal: Patient Will Transfer Sit To/From Stand 09/27/2022 1007 by Belva Chimes, Student-PT Outcome: Progressing Flowsheets (Taken 09/27/2022 1007) Patient will transfer sit to/from stand: with minimal assist 09/27/2022 1006 by Belva Chimes, Student-PT Outcome: Progressing Goal: Pt Will Transfer Bed To Chair/Chair To Bed 09/27/2022 1007 by Belva Chimes, Student-PT Outcome: Progressing Flowsheets (Taken 09/27/2022 1007) Pt will Transfer Bed to Chair/Chair to Bed: with min assist 09/27/2022 1006 by Belva Chimes, Student-PT Outcome: Progressing Goal: Pt Will Ambulate 09/27/2022 1007 by Belva Chimes, Student-PT Outcome: Progressing Flowsheets (Taken 09/27/2022 1007) Pt will Ambulate:  15 feet  with moderate assist 09/27/2022 1006 by Belva Chimes, Student-PT Outcome: Progressing     [facility] [place], DPT Program

## 2022-09-28 DIAGNOSIS — I4891 Unspecified atrial fibrillation: Secondary | ICD-10-CM | POA: Diagnosis not present

## 2022-09-28 MED ORDER — TRAMADOL HCL 50 MG PO TABS: 50.0000 mg | ORAL_TABLET | Freq: Four times a day (QID) | ORAL | 0 refills | Status: AC | PRN

## 2022-09-28 NOTE — Progress Notes (Signed)
Mobility Specialist Progress Note:    09/28/22 1200  Mobility  Activity Transferred from bed to chair  Level of Assistance Moderate assist, patient does 50-74%  Assistive Device Front wheel walker  Distance Ambulated (ft) 4 ft  Range of Motion/Exercises Active;All extremities  Activity Response Tolerated well  Mobility Referral Yes  $Mobility charge 1 Mobility  Mobility Specialist Start Time (ACUTE ONLY) 1200  Mobility Specialist Stop Time (ACUTE ONLY) 1210  Mobility Specialist Time Calculation (min) (ACUTE ONLY) 10 min   Pt was received in bed, agreeable to mobility session. Required ModA to stand and transfer to chair with RW. Tolerated well, pt said she struggles to move RLE. Left pt in chair, alarm on. All needs met.   Lawerance Bach Mobility Specialist Please contact via Special educational needs teacher or  Rehab office at [phone]

## 2022-09-28 NOTE — Care Management Important Message (Signed)
Important Message  Patient Details  Name: Bailey Wilson MRN: [id number] Date of Birth: [birth year]   Medicare Important Message Given:  N/A - LOS <3 / Initial given by admissions     Corey Harold 09/28/2022, 10:38 AM

## 2022-09-28 NOTE — Progress Notes (Signed)
Patient discharge from hospital with discharged instructions given on medications and follow up visits,patient verbalized understanding .Prescriptions sent to Pharmacy of choice documented on AVS. IV discontinued,catheter intact. Accompanied by staff to an awaiting vehicle.

## 2022-09-28 NOTE — Progress Notes (Signed)
Patient rested through the night, presenting with periods of confusion. No complaints of pain, patient remains on continuous cardiac monitoring. No acute overnight events.

## 2022-09-28 NOTE — TOC Transition Note (Addendum)
Transition of Care [facility]) - CM/SW Discharge Note   Patient Details  Name: Bailey Wilson MRN: [id number] Date of Birth: October 17, 1940  [facility] CM/SW Contact:  Karn Cassis, LCSW Phone Number: 09/28/2022, 10:20 AM   Clinical Narrative: Pt d/c today. Pt's daughter, [name] notified and is on her way from [place]. Pt will go to her home at [facility] [address] V[address] Budge. Christian with [facility] notified of d/c and address. HH orders in. RN updated.       Final next level of care: Home w Home Health Services Barriers to Discharge: Barriers Resolved   Patient Goals and CMS Choice   Choice offered to / list presented to : Patient, Adult Children  Discharge Placement                    Name of family member notified: [name]- daughter Patient and family notified of of transfer: 09/28/22  Discharge Plan and Services Additional resources added to the After Visit Summary for   In-house Referral: Clinical Social Work   Post Acute Care Choice: Resumption of Svcs/PTA Provider                    HH Arranged: RN, PT, OT HH Agency: [facility] Date [facility] Agency Contacted: 09/27/22 Time HH Agency Contacted: [phone] Representative spoke with at [facility] Agency: Ephriam Knuckles  Social Determinants of Health (SDOH) Interventions SDOH Screenings   Food Insecurity: Patient Unable To Answer (09/27/2022)  Housing: Patient Unable To Answer (09/27/2022)  Transportation Needs: Patient Unable To Answer (09/27/2022)  Utilities: Patient Unable To Answer (09/27/2022)  Financial Resource Strain: Low Risk  (09/12/2022)   Received from [facility]  Physical Activity: Unknown (07/06/2022)   Received from [facility]  Social Connections: Moderately Integrated (07/06/2022)   Received from [facility]  Stress: No Stress Concern Present (07/06/2022)   Received from [facility]  Tobacco Use: Low Risk  (09/27/2022)     Readmission Risk Interventions     No data to display

## 2022-09-28 NOTE — Discharge Summary (Signed)
Physician Discharge Summary  Bailey Wilson VQQ:[id number] DOB: 1940-07-20 DOA: 09/26/2022  PCP: Assunta Found, MD  Admit date: 09/26/2022  Discharge date: 09/28/2022  Admitted From:Home  Disposition:  Home  Recommendations for Outpatient Follow-up:  Follow up with PCP in 1-2 weeks Tramadol prescribed as needed for pain management Continue other home medications as prior  Home Health: Yes with PT, OT, RN  Equipment/Devices: None  Discharge Condition:Stable  CODE STATUS: Full  Diet recommendation: Heart Healthy  Brief/Interim Summary: Bailey Wilson is a pleasant 82 y.o. female with medical history significant for anal cancer status post chemo and radiation, atrial fibrillation on Eliquis, chronic HFmrEF, hypertension, and iron deficiency anemia who presents to the ED with back pain after a fall at home.  She was noted to have a lumbar compression fracture involving superior endplate of L5 noted on CT scan and is otherwise neurologically intact.  She was noted to have some associated atrial fibrillation with RVR which has now resolved.  She is in stable condition for discharge home with home physical therapy services.  She has been seen by PT with recommendation for SNF, but refuses at this time.  No other acute events or concerns noted in this she states her back pain is well-controlled.  Discharge Diagnoses:  Principal Problem:   Atrial fibrillation with RVR (HCC) Active Problems:   Essential hypertension   Lumbar compression fracture, closed, initial encounter (HCC)   Normocytic anemia   Thrombocytopenia (HCC)   Heart failure with mildly reduced ejection fraction (HFmrEF) (HCC)  Principal discharge diagnosis: Fall with lumbar compression fracture of L5 and associated atrial fibrillation with RVR.  Discharge Instructions  Discharge Instructions     Amb referral to [facility]   Complete by: As directed    Diet - low sodium heart healthy   Complete by: As directed     Increase activity slowly   Complete by: As directed       Allergies as of 09/28/2022       Reactions   Penicillins Hives, Itching, Palpitations   Has patient had a PCN reaction causing immediate rash, facial/tongue/throat swelling, SOB or lightheadedness with hypotension: YES  Has patient had a PCN reaction causing severe rash involving mucus membranes or skin necrosis: No  Has patient had a PCN reaction that required hospitalization No  Has patient had a PCN reaction occurring within the last 10 years: No  If all of the above answers are "NO", then may proceed with Cephalosporin use.   Ace Inhibitors Cough   Aspirin Other (See Comments)   Heart racing (large doses)   Sulfa Antibiotics Itching, Rash        Medication List     TAKE these medications    albuterol 108 (90 Base) MCG/ACT inhaler Commonly known as: VENTOLIN HFA Inhale 2 puffs into the lungs every 6 (six) hours as needed for wheezing or shortness of breath.   amLODipine 5 MG tablet Commonly known as: NORVASC TAKE 1 TABLET BY MOUTH EVERY DAY IN THE MORNING   ARTIFICIAL TEAR OP Apply 2 drops to eye daily as needed (dry eyes).   CLARITIN PO Take 1 tablet by mouth as needed (allergies, rhinitis).   Eliquis 5 MG Tabs tablet Generic drug: apixaban TAKE 1 TABLET BY MOUTH TWICE A DAY   ferrous sulfate 325 (65 FE) MG tablet Take 325 mg by mouth daily with breakfast.   metoprolol tartrate 25 MG tablet Commonly known as: LOPRESSOR TAKE 1 TABLET BY MOUTH TWICE A  DAY What changed: when to take this   ondansetron 4 MG tablet Commonly known as: ZOFRAN Take 12 mg by mouth every 8 (eight) hours as needed for nausea, vomiting or refractory nausea / vomiting.   traMADol 50 MG tablet Commonly known as: ULTRAM Take 1 tablet (50 mg total) by mouth every 6 (six) hours as needed for severe pain or moderate pain.        Follow-up Information     Assunta Found, MD. Schedule an appointment as soon as possible for a  visit in 1 week(s).   Specialty: Family Medicine Contact information: [address] [place] [address] [phone]                Allergies  Allergen Reactions   Penicillins Hives, Itching and Palpitations    Has patient had a PCN reaction causing immediate rash, facial/tongue/throat swelling, SOB or lightheadedness with hypotension: YES  Has patient had a PCN reaction causing severe rash involving mucus membranes or skin necrosis: No  Has patient had a PCN reaction that required hospitalization No  Has patient had a PCN reaction occurring within the last 10 years: No  If all of the above answers are "NO", then may proceed with Cephalosporin use.   Ace Inhibitors Cough   Aspirin Other (See Comments)    Heart racing (large doses)   Sulfa Antibiotics Itching and Rash    Consultations: None   Procedures/Studies: DG HIP UNILAT W OR W/O PELVIS 2-3 VIEWS RIGHT  Result Date: 09/26/2022 CLINICAL DATA:  Recent fall with right hip pain, initial encounter EXAM: DG HIP (WITH OR WITHOUT PELVIS) 3V RIGHT COMPARISON:  03/12/2022 FINDINGS: Right hip prosthesis is noted in satisfactory position. Healed fracture in the inferior pubic ramus adjacent to the pubic symphysis is noted similar to that seen on prior CT. No acute fracture is noted. IMPRESSION: No acute abnormality noted. Electronically Signed   By: Alcide Clever M.D.   On: 09/26/2022 23:54   CT Lumbar Spine Wo Contrast  Result Date: 09/26/2022 CLINICAL DATA:  Fall, low back pain EXAM: CT LUMBAR SPINE WITHOUT CONTRAST TECHNIQUE: Multidetector CT imaging of the lumbar spine was performed without intravenous contrast administration. Multiplanar CT image reconstructions were also generated. RADIATION DOSE REDUCTION: This exam was performed according to the departmental dose-optimization program which includes automated exposure control, adjustment of the mA and/or kV according to patient size and/or use of iterative  reconstruction technique. COMPARISON:  CT abdomen/pelvis dated 03/12/2022 FINDINGS: Segmentation: 5 lumbar type vertebral bodies. Alignment: Normal lumbar lordosis. Vertebrae: Moderate superior endplate compression fracture deformity at L5, with approximately 40% loss of height (sagittal image 32). No retropulsion. While this appearance may be subacute or chronic, it is new from January 2024. Vertebral body heights are otherwise maintained. Paraspinal and other soft tissues: Negative. Disc levels: Mild degenerative changes of the lumbar spine, most prominent at L1-2 and L5-S1. Spinal canal is patent. IMPRESSION: Moderate superior endplate compression fracture deformity at L5, as above. While possibly subacute or chronic, this is new from January 2024. Otherwise, no traumatic injury to the lumbar spine. Mild degenerative changes. Electronically Signed   By: Charline Bills M.D.   On: 09/26/2022 23:12   CT Head Wo Contrast  Result Date: 09/26/2022 CLINICAL DATA:  Recent fall with headaches and neck pain, initial encounter EXAM: CT HEAD WITHOUT CONTRAST CT CERVICAL SPINE WITHOUT CONTRAST TECHNIQUE: Multidetector CT imaging of the head and cervical spine was performed following the standard protocol without intravenous contrast. Multiplanar CT image  reconstructions of the cervical spine were also generated. RADIATION DOSE REDUCTION: This exam was performed according to the departmental dose-optimization program which includes automated exposure control, adjustment of the mA and/or kV according to patient size and/or use of iterative reconstruction technique. COMPARISON:  03/09/2022 FINDINGS: CT HEAD FINDINGS Brain: No evidence of acute infarction, hemorrhage, hydrocephalus, extra-axial collection or mass lesion/mass effect. Chronic atrophic and ischemic changes are noted. Vascular: No hyperdense vessel or unexpected calcification. Skull: Normal. Negative for fracture or focal lesion. Sinuses/Orbits: No acute  finding. Other: None. CT CERVICAL SPINE FINDINGS Alignment: Within normal limits. Skull base and vertebrae: Cervical segments are well visualized. Vertebral body height is maintained. Multilevel disc space narrowing is seen. Mild osteophytic changes and facet hypertrophic changes are noted. No acute fracture or acute facet abnormality is noted. Soft tissues and spinal canal: Surrounding soft tissue structures are within normal limits. Vascular calcifications are seen. Right chest wall port is noted. Upper chest: Lung apices demonstrate pleural and parenchymal scarring bilaterally. Other: None IMPRESSION: CT of the head: Chronic atrophic and ischemic changes without acute abnormality. CT of cervical spine: Multilevel degenerative change without acute abnormality. Electronically Signed   By: Alcide Clever M.D.   On: 09/26/2022 23:10   CT Cervical Spine Wo Contrast  Result Date: 09/26/2022 CLINICAL DATA:  Recent fall with headaches and neck pain, initial encounter EXAM: CT HEAD WITHOUT CONTRAST CT CERVICAL SPINE WITHOUT CONTRAST TECHNIQUE: Multidetector CT imaging of the head and cervical spine was performed following the standard protocol without intravenous contrast. Multiplanar CT image reconstructions of the cervical spine were also generated. RADIATION DOSE REDUCTION: This exam was performed according to the departmental dose-optimization program which includes automated exposure control, adjustment of the mA and/or kV according to patient size and/or use of iterative reconstruction technique. COMPARISON:  03/09/2022 FINDINGS: CT HEAD FINDINGS Brain: No evidence of acute infarction, hemorrhage, hydrocephalus, extra-axial collection or mass lesion/mass effect. Chronic atrophic and ischemic changes are noted. Vascular: No hyperdense vessel or unexpected calcification. Skull: Normal. Negative for fracture or focal lesion. Sinuses/Orbits: No acute finding. Other: None. CT CERVICAL SPINE FINDINGS Alignment: Within  normal limits. Skull base and vertebrae: Cervical segments are well visualized. Vertebral body height is maintained. Multilevel disc space narrowing is seen. Mild osteophytic changes and facet hypertrophic changes are noted. No acute fracture or acute facet abnormality is noted. Soft tissues and spinal canal: Surrounding soft tissue structures are within normal limits. Vascular calcifications are seen. Right chest wall port is noted. Upper chest: Lung apices demonstrate pleural and parenchymal scarring bilaterally. Other: None IMPRESSION: CT of the head: Chronic atrophic and ischemic changes without acute abnormality. CT of cervical spine: Multilevel degenerative change without acute abnormality. Electronically Signed   By: Alcide Clever M.D.   On: 09/26/2022 23:10     Discharge Exam: Vitals:   09/28/22 0445 09/28/22 0829  BP: (!) 131/94 (!) 147/87  Pulse: 76 100  Resp: 20   Temp: (!) 97.3 F (36.3 C) 97.6 F (36.4 C)  SpO2: 94% 96%   Vitals:   09/27/22 2341 09/28/22 0445 09/28/22 0500 09/28/22 0829  BP: 105/78 (!) 131/94  (!) 147/87  Pulse: 99 76  100  Resp: 18 20    Temp: 97.9 F (36.6 C) (!) 97.3 F (36.3 C)  97.6 F (36.4 C)  TempSrc:  Oral  Oral  SpO2: 94% 94%  96%  Weight:   63.2 kg   Height:        General: Pt is alert, awake, not  in acute distress Cardiovascular: Irregular rate, S1/S2 +, no rubs, no gallops Respiratory: CTA bilaterally, no wheezing, no rhonchi Abdominal: Soft, NT, ND, bowel sounds + Extremities: no edema, no cyanosis    The results of significant diagnostics from this hospitalization (including imaging, microbiology, ancillary and laboratory) are listed below for reference.     Microbiology: Recent Results (from the past 240 hour(s))  MRSA Next Gen by PCR, Nasal     Status: None   Collection Time: 09/27/22  5:38 AM   Specimen: Nasal Mucosa; Nasal Swab  Result Value Ref Range Status   MRSA by PCR Next Gen NOT DETECTED NOT DETECTED Final     Comment: (NOTE) The GeneXpert MRSA Assay (FDA approved for NASAL specimens only), is one component of a comprehensive MRSA colonization surveillance program. It is not intended to diagnose MRSA infection nor to guide or monitor treatment for MRSA infections. Test performance is not FDA approved in patients less than 57 years old. Performed at [facility], [address] [place], [place] [address]      Labs: BNP (last 3 results) No results for input(s): "BNP" in the last 8760 hours. Basic Metabolic Panel: Recent Labs  Lab 09/26/22 2238 09/27/22 0540 09/28/22 0440  NA 134* 134* 130*  K 4.2 3.8 3.7  CL 103 103 99  CO2 20* 22 22  GLUCOSE 131* 141* 112*  BUN 15 15 19   CREATININE 0.88 0.89 0.94  CALCIUM 9.5 9.4 9.0  MG  --  1.7 1.7   Liver Function Tests: Recent Labs  Lab 09/26/22 2238 09/27/22 0540  AST 21 20  ALT 12 13  ALKPHOS 85 83  BILITOT 1.5* 1.0  PROT 7.7 7.7  ALBUMIN 3.7 3.7   No results for input(s): "LIPASE", "AMYLASE" in the last 168 hours. No results for input(s): "AMMONIA" in the last 168 hours. CBC: Recent Labs  Lab 09/26/22 2238 09/27/22 0540 09/28/22 0440  WBC 6.5 6.6 7.7  HGB 9.5* 10.4* 9.8*  HCT 29.2* 33.4* 31.3*  MCV 93.0 97.7 96.0  PLT 138* 126* 125*   Cardiac Enzymes: No results for input(s): "CKTOTAL", "CKMB", "CKMBINDEX", "TROPONINI" in the last 168 hours. BNP: Invalid input(s): "POCBNP" CBG: Recent Labs  Lab 09/27/22 2042 09/27/22 2050  GLUCAP 142* 129*   D-Dimer No results for input(s): "DDIMER" in the last 72 hours. Hgb A1c No results for input(s): "HGBA1C" in the last 72 hours. Lipid Profile No results for input(s): "CHOL", "HDL", "LDLCALC", "TRIG", "CHOLHDL", "LDLDIRECT" in the last 72 hours. Thyroid function studies No results for input(s): "TSH", "T4TOTAL", "T3FREE", "THYROIDAB" in the last 72 hours.  Invalid input(s): "FREET3" Anemia work up Recent Labs    09/27/22 0354 09/27/22 0540  VITAMINB12 166*  --    FOLATE  --  10.2  FERRITIN 338*  --   TIBC 232*  --   IRON 44  --   RETICCTPCT  --  2.0   Urinalysis    Component Value Date/Time   COLORURINE YELLOW 09/27/2022 0018   APPEARANCEUR CLEAR 09/27/2022 0018   LABSPEC 1.011 09/27/2022 0018   PHURINE 7.0 09/27/2022 0018   GLUCOSEU NEGATIVE 09/27/2022 0018   HGBUR SMALL (A) 09/27/2022 0018   BILIRUBINUR NEGATIVE 09/27/2022 0018   KETONESUR 20 (A) 09/27/2022 0018   PROTEINUR 30 (A) 09/27/2022 0018   NITRITE NEGATIVE 09/27/2022 0018   LEUKOCYTESUR NEGATIVE 09/27/2022 0018   Sepsis Labs Recent Labs  Lab 09/26/22 2238 09/27/22 0540 09/28/22 0440  WBC 6.5 6.6 7.7   Microbiology Recent  Results (from the past 240 hour(s))  MRSA Next Gen by PCR, Nasal     Status: None   Collection Time: 09/27/22  5:38 AM   Specimen: Nasal Mucosa; Nasal Swab  Result Value Ref Range Status   MRSA by PCR Next Gen NOT DETECTED NOT DETECTED Final    Comment: (NOTE) The GeneXpert MRSA Assay (FDA approved for NASAL specimens only), is one component of a comprehensive MRSA colonization surveillance program. It is not intended to diagnose MRSA infection nor to guide or monitor treatment for MRSA infections. Test performance is not FDA approved in patients less than 79 years old. Performed at [facility], [address], [place], [place] [address]      Time coordinating discharge: 35 minutes  SIGNED:   Erick Blinks, DO [facility] 09/28/2022, 9:57 AM  If 7PM-7AM, please contact night-coverage [email or web address]

## 2022-09-29 DIAGNOSIS — F0390 Unspecified dementia without behavioral disturbance: Secondary | ICD-10-CM | POA: Diagnosis not present

## 2022-09-29 DIAGNOSIS — R339 Retention of urine, unspecified: Secondary | ICD-10-CM | POA: Diagnosis not present

## 2022-09-29 DIAGNOSIS — M79671 Pain in right foot: Secondary | ICD-10-CM | POA: Diagnosis not present

## 2022-09-29 DIAGNOSIS — T451X5A Adverse effect of antineoplastic and immunosuppressive drugs, initial encounter: Secondary | ICD-10-CM | POA: Diagnosis not present

## 2022-09-29 DIAGNOSIS — M19071 Primary osteoarthritis, right ankle and foot: Secondary | ICD-10-CM | POA: Diagnosis not present

## 2022-09-29 DIAGNOSIS — I5020 Unspecified systolic (congestive) heart failure: Secondary | ICD-10-CM | POA: Diagnosis not present

## 2022-09-29 DIAGNOSIS — I110 Hypertensive heart disease with heart failure: Secondary | ICD-10-CM | POA: Diagnosis not present

## 2022-09-29 DIAGNOSIS — R2689 Other abnormalities of gait and mobility: Secondary | ICD-10-CM | POA: Diagnosis not present

## 2022-09-29 DIAGNOSIS — R531 Weakness: Secondary | ICD-10-CM | POA: Diagnosis not present

## 2022-09-29 DIAGNOSIS — W19XXXA Unspecified fall, initial encounter: Secondary | ICD-10-CM | POA: Diagnosis not present

## 2022-09-29 DIAGNOSIS — M79604 Pain in right leg: Secondary | ICD-10-CM | POA: Diagnosis not present

## 2022-09-29 DIAGNOSIS — G928 Other toxic encephalopathy: Secondary | ICD-10-CM | POA: Diagnosis not present

## 2022-09-29 DIAGNOSIS — R2990 Unspecified symptoms and signs involving the nervous system: Secondary | ICD-10-CM | POA: Diagnosis not present

## 2022-09-29 DIAGNOSIS — Z743 Need for continuous supervision: Secondary | ICD-10-CM | POA: Diagnosis not present

## 2022-09-30 DIAGNOSIS — I6603 Occlusion and stenosis of bilateral middle cerebral arteries: Secondary | ICD-10-CM | POA: Diagnosis not present

## 2022-09-30 DIAGNOSIS — R531 Weakness: Secondary | ICD-10-CM | POA: Diagnosis not present

## 2022-09-30 DIAGNOSIS — R29898 Other symptoms and signs involving the musculoskeletal system: Secondary | ICD-10-CM | POA: Diagnosis not present

## 2022-09-30 DIAGNOSIS — I6782 Cerebral ischemia: Secondary | ICD-10-CM | POA: Diagnosis not present

## 2022-09-30 NOTE — Consult Note (Signed)
[facility] Customer service manager [facility]) Accountable Care Organization (ACO) [facility] Liaison Note  09/30/2022  CARNELLA TIENDA Aug 25, 1940 [id number]  Location: [facility] RN Hospital Liaison screened the patient remotely at [facility].  Insurance: [facility]   Alexuis RAPHAELA WILLITTS is a 82 y.o. female who is a Primary Care Patient of Assunta Found, MD [facility]). The patient was screened for readmission hospitalization with noted low risk score for unplanned readmission risk with 1 IP in 6 months.  The patient was assessed for potential [facility]) Care Management service needs for post hospital transition for care coordination. Review of patient's electronic medical record reveals patient was admitted for a fall.  TOC notes indicate pt will go home with daughter to [place] for her ongoing recovery.    [facility] Care Management/Population Health does not replace or interfere with any arrangements made by the Inpatient Transition of Care team.   For questions contact:   Elliot Cousin, RN, [name] Liaison    Population Health Office Hours MTWF  8:00 am-6:00 pm Off on Thursday [phone] mobile [phone] [Office toll free line] Office Hours are M-F 8:30 - 5 pm [email or web address]

## 2022-10-01 DIAGNOSIS — R531 Weakness: Secondary | ICD-10-CM | POA: Diagnosis not present

## 2022-10-02 DIAGNOSIS — R531 Weakness: Secondary | ICD-10-CM | POA: Diagnosis not present

## 2022-10-03 DIAGNOSIS — R531 Weakness: Secondary | ICD-10-CM | POA: Diagnosis not present

## 2022-10-03 DIAGNOSIS — M25511 Pain in right shoulder: Secondary | ICD-10-CM | POA: Diagnosis not present

## 2022-10-03 DIAGNOSIS — Z471 Aftercare following joint replacement surgery: Secondary | ICD-10-CM | POA: Diagnosis not present

## 2022-10-03 DIAGNOSIS — Z96641 Presence of right artificial hip joint: Secondary | ICD-10-CM | POA: Diagnosis not present

## 2022-10-04 DIAGNOSIS — R531 Weakness: Secondary | ICD-10-CM | POA: Diagnosis not present

## 2022-10-05 DIAGNOSIS — R531 Weakness: Secondary | ICD-10-CM | POA: Diagnosis not present

## 2022-10-06 DIAGNOSIS — R339 Retention of urine, unspecified: Secondary | ICD-10-CM | POA: Diagnosis not present

## 2022-10-06 DIAGNOSIS — R531 Weakness: Secondary | ICD-10-CM | POA: Diagnosis not present

## 2022-10-07 DIAGNOSIS — R531 Weakness: Secondary | ICD-10-CM | POA: Diagnosis not present

## 2022-10-08 DIAGNOSIS — R531 Weakness: Secondary | ICD-10-CM | POA: Diagnosis not present

## 2022-10-09 DIAGNOSIS — R531 Weakness: Secondary | ICD-10-CM | POA: Diagnosis not present

## 2022-10-10 DIAGNOSIS — C210 Malignant neoplasm of anus, unspecified: Secondary | ICD-10-CM | POA: Diagnosis not present

## 2022-10-10 DIAGNOSIS — W19XXXA Unspecified fall, initial encounter: Secondary | ICD-10-CM | POA: Diagnosis not present

## 2022-10-10 DIAGNOSIS — I10 Essential (primary) hypertension: Secondary | ICD-10-CM | POA: Diagnosis not present

## 2022-10-10 DIAGNOSIS — I5042 Chronic combined systolic (congestive) and diastolic (congestive) heart failure: Secondary | ICD-10-CM | POA: Diagnosis not present

## 2022-10-10 DIAGNOSIS — C44520 Squamous cell carcinoma of anal skin: Secondary | ICD-10-CM | POA: Diagnosis not present

## 2022-10-10 DIAGNOSIS — I110 Hypertensive heart disease with heart failure: Secondary | ICD-10-CM | POA: Diagnosis not present

## 2022-10-10 DIAGNOSIS — G459 Transient cerebral ischemic attack, unspecified: Secondary | ICD-10-CM | POA: Diagnosis not present

## 2022-10-10 DIAGNOSIS — R339 Retention of urine, unspecified: Secondary | ICD-10-CM | POA: Diagnosis not present

## 2022-10-10 DIAGNOSIS — Z7189 Other specified counseling: Secondary | ICD-10-CM | POA: Diagnosis not present

## 2022-10-10 DIAGNOSIS — I5020 Unspecified systolic (congestive) heart failure: Secondary | ICD-10-CM | POA: Diagnosis not present

## 2022-10-10 DIAGNOSIS — J309 Allergic rhinitis, unspecified: Secondary | ICD-10-CM | POA: Diagnosis not present

## 2022-10-10 DIAGNOSIS — I480 Paroxysmal atrial fibrillation: Secondary | ICD-10-CM | POA: Diagnosis not present

## 2022-10-10 DIAGNOSIS — J810 Acute pulmonary edema: Secondary | ICD-10-CM | POA: Diagnosis not present

## 2022-10-10 DIAGNOSIS — D638 Anemia in other chronic diseases classified elsewhere: Secondary | ICD-10-CM | POA: Diagnosis not present

## 2022-10-10 DIAGNOSIS — F0390 Unspecified dementia without behavioral disturbance: Secondary | ICD-10-CM | POA: Diagnosis not present

## 2022-10-10 DIAGNOSIS — G928 Other toxic encephalopathy: Secondary | ICD-10-CM | POA: Diagnosis not present

## 2022-10-10 DIAGNOSIS — Z743 Need for continuous supervision: Secondary | ICD-10-CM | POA: Diagnosis not present

## 2022-10-10 DIAGNOSIS — R2990 Unspecified symptoms and signs involving the nervous system: Secondary | ICD-10-CM | POA: Diagnosis not present

## 2022-10-10 DIAGNOSIS — N3000 Acute cystitis without hematuria: Secondary | ICD-10-CM | POA: Diagnosis not present

## 2022-10-10 DIAGNOSIS — M79604 Pain in right leg: Secondary | ICD-10-CM | POA: Diagnosis not present

## 2022-10-10 DIAGNOSIS — R6889 Other general symptoms and signs: Secondary | ICD-10-CM | POA: Diagnosis not present

## 2022-10-10 DIAGNOSIS — D61818 Other pancytopenia: Secondary | ICD-10-CM | POA: Diagnosis not present

## 2022-10-10 DIAGNOSIS — M19071 Primary osteoarthritis, right ankle and foot: Secondary | ICD-10-CM | POA: Diagnosis not present

## 2022-10-10 DIAGNOSIS — I4891 Unspecified atrial fibrillation: Secondary | ICD-10-CM | POA: Diagnosis not present

## 2022-10-10 DIAGNOSIS — N390 Urinary tract infection, site not specified: Secondary | ICD-10-CM | POA: Diagnosis not present

## 2022-10-10 DIAGNOSIS — T451X5A Adverse effect of antineoplastic and immunosuppressive drugs, initial encounter: Secondary | ICD-10-CM | POA: Diagnosis not present

## 2022-10-10 DIAGNOSIS — R531 Weakness: Secondary | ICD-10-CM | POA: Diagnosis not present

## 2022-10-11 DIAGNOSIS — I4891 Unspecified atrial fibrillation: Secondary | ICD-10-CM | POA: Diagnosis not present

## 2022-10-11 DIAGNOSIS — N390 Urinary tract infection, site not specified: Secondary | ICD-10-CM | POA: Diagnosis not present

## 2022-10-11 DIAGNOSIS — G459 Transient cerebral ischemic attack, unspecified: Secondary | ICD-10-CM | POA: Diagnosis not present

## 2022-10-11 DIAGNOSIS — D638 Anemia in other chronic diseases classified elsewhere: Secondary | ICD-10-CM | POA: Diagnosis not present

## 2022-10-11 DIAGNOSIS — Z7189 Other specified counseling: Secondary | ICD-10-CM | POA: Diagnosis not present

## 2022-10-11 DIAGNOSIS — J309 Allergic rhinitis, unspecified: Secondary | ICD-10-CM | POA: Diagnosis not present

## 2022-10-11 DIAGNOSIS — R339 Retention of urine, unspecified: Secondary | ICD-10-CM | POA: Diagnosis not present

## 2022-10-11 DIAGNOSIS — I10 Essential (primary) hypertension: Secondary | ICD-10-CM | POA: Diagnosis not present

## 2022-10-13 DIAGNOSIS — R339 Retention of urine, unspecified: Secondary | ICD-10-CM | POA: Diagnosis not present

## 2022-10-13 DIAGNOSIS — N3000 Acute cystitis without hematuria: Secondary | ICD-10-CM | POA: Diagnosis not present

## 2022-10-14 DIAGNOSIS — R339 Retention of urine, unspecified: Secondary | ICD-10-CM | POA: Diagnosis not present

## 2022-10-14 DIAGNOSIS — G459 Transient cerebral ischemic attack, unspecified: Secondary | ICD-10-CM | POA: Diagnosis not present

## 2022-10-14 DIAGNOSIS — M19071 Primary osteoarthritis, right ankle and foot: Secondary | ICD-10-CM | POA: Diagnosis not present

## 2022-10-14 DIAGNOSIS — I10 Essential (primary) hypertension: Secondary | ICD-10-CM | POA: Diagnosis not present

## 2022-10-14 DIAGNOSIS — I4891 Unspecified atrial fibrillation: Secondary | ICD-10-CM | POA: Diagnosis not present

## 2022-10-17 DIAGNOSIS — R6889 Other general symptoms and signs: Secondary | ICD-10-CM | POA: Diagnosis not present

## 2022-10-17 DIAGNOSIS — M19071 Primary osteoarthritis, right ankle and foot: Secondary | ICD-10-CM | POA: Diagnosis not present

## 2022-10-18 DIAGNOSIS — I480 Paroxysmal atrial fibrillation: Secondary | ICD-10-CM | POA: Diagnosis not present

## 2022-10-18 DIAGNOSIS — I10 Essential (primary) hypertension: Secondary | ICD-10-CM | POA: Diagnosis not present

## 2022-10-20 DIAGNOSIS — C44520 Squamous cell carcinoma of anal skin: Secondary | ICD-10-CM | POA: Diagnosis not present

## 2022-10-20 DIAGNOSIS — C210 Malignant neoplasm of anus, unspecified: Secondary | ICD-10-CM | POA: Diagnosis not present

## 2022-10-20 DIAGNOSIS — D61818 Other pancytopenia: Secondary | ICD-10-CM | POA: Diagnosis not present

## 2022-10-21 DIAGNOSIS — J810 Acute pulmonary edema: Secondary | ICD-10-CM | POA: Diagnosis not present

## 2022-10-24 DIAGNOSIS — D638 Anemia in other chronic diseases classified elsewhere: Secondary | ICD-10-CM | POA: Diagnosis not present

## 2022-10-24 DIAGNOSIS — M19071 Primary osteoarthritis, right ankle and foot: Secondary | ICD-10-CM | POA: Diagnosis not present

## 2022-10-24 DIAGNOSIS — R110 Nausea: Secondary | ICD-10-CM | POA: Diagnosis not present

## 2022-10-24 DIAGNOSIS — J309 Allergic rhinitis, unspecified: Secondary | ICD-10-CM | POA: Diagnosis not present

## 2022-10-24 DIAGNOSIS — I10 Essential (primary) hypertension: Secondary | ICD-10-CM | POA: Diagnosis not present

## 2022-10-24 DIAGNOSIS — J810 Acute pulmonary edema: Secondary | ICD-10-CM | POA: Diagnosis not present

## 2022-10-24 DIAGNOSIS — G459 Transient cerebral ischemic attack, unspecified: Secondary | ICD-10-CM | POA: Diagnosis not present

## 2022-10-24 DIAGNOSIS — I4891 Unspecified atrial fibrillation: Secondary | ICD-10-CM | POA: Diagnosis not present

## 2022-10-26 DIAGNOSIS — I4891 Unspecified atrial fibrillation: Secondary | ICD-10-CM | POA: Diagnosis not present

## 2022-10-26 DIAGNOSIS — Z85820 Personal history of malignant melanoma of skin: Secondary | ICD-10-CM | POA: Diagnosis not present

## 2022-10-26 DIAGNOSIS — J45909 Unspecified asthma, uncomplicated: Secondary | ICD-10-CM | POA: Diagnosis not present

## 2022-10-26 DIAGNOSIS — D649 Anemia, unspecified: Secondary | ICD-10-CM | POA: Diagnosis not present

## 2022-10-26 DIAGNOSIS — I110 Hypertensive heart disease with heart failure: Secondary | ICD-10-CM | POA: Diagnosis not present

## 2022-10-26 DIAGNOSIS — M810 Age-related osteoporosis without current pathological fracture: Secondary | ICD-10-CM | POA: Diagnosis not present

## 2022-10-26 DIAGNOSIS — Z7901 Long term (current) use of anticoagulants: Secondary | ICD-10-CM | POA: Diagnosis not present

## 2022-10-26 DIAGNOSIS — I509 Heart failure, unspecified: Secondary | ICD-10-CM | POA: Diagnosis not present

## 2022-10-26 DIAGNOSIS — M19071 Primary osteoarthritis, right ankle and foot: Secondary | ICD-10-CM | POA: Diagnosis not present

## 2022-10-27 DIAGNOSIS — I4891 Unspecified atrial fibrillation: Secondary | ICD-10-CM | POA: Diagnosis not present

## 2022-10-27 DIAGNOSIS — M810 Age-related osteoporosis without current pathological fracture: Secondary | ICD-10-CM | POA: Diagnosis not present

## 2022-10-27 DIAGNOSIS — I110 Hypertensive heart disease with heart failure: Secondary | ICD-10-CM | POA: Diagnosis not present

## 2022-10-27 DIAGNOSIS — C210 Malignant neoplasm of anus, unspecified: Secondary | ICD-10-CM | POA: Diagnosis not present

## 2022-10-27 DIAGNOSIS — I5042 Chronic combined systolic (congestive) and diastolic (congestive) heart failure: Secondary | ICD-10-CM | POA: Diagnosis not present

## 2022-10-27 DIAGNOSIS — J45909 Unspecified asthma, uncomplicated: Secondary | ICD-10-CM | POA: Diagnosis not present

## 2022-10-27 DIAGNOSIS — Z7901 Long term (current) use of anticoagulants: Secondary | ICD-10-CM | POA: Diagnosis not present

## 2022-10-27 DIAGNOSIS — I509 Heart failure, unspecified: Secondary | ICD-10-CM | POA: Diagnosis not present

## 2022-10-27 DIAGNOSIS — D649 Anemia, unspecified: Secondary | ICD-10-CM | POA: Diagnosis not present

## 2022-10-27 DIAGNOSIS — B351 Tinea unguium: Secondary | ICD-10-CM | POA: Diagnosis not present

## 2022-10-27 DIAGNOSIS — E7800 Pure hypercholesterolemia, unspecified: Secondary | ICD-10-CM | POA: Diagnosis not present

## 2022-10-27 DIAGNOSIS — Z85820 Personal history of malignant melanoma of skin: Secondary | ICD-10-CM | POA: Diagnosis not present

## 2022-10-27 DIAGNOSIS — I10 Essential (primary) hypertension: Secondary | ICD-10-CM | POA: Diagnosis not present

## 2022-10-27 DIAGNOSIS — M19071 Primary osteoarthritis, right ankle and foot: Secondary | ICD-10-CM | POA: Diagnosis not present

## 2022-10-27 DIAGNOSIS — S32301D Unspecified fracture of right ilium, subsequent encounter for fracture with routine healing: Secondary | ICD-10-CM | POA: Diagnosis not present

## 2022-10-27 DIAGNOSIS — D638 Anemia in other chronic diseases classified elsewhere: Secondary | ICD-10-CM | POA: Diagnosis not present

## 2022-10-27 DIAGNOSIS — R270 Ataxia, unspecified: Secondary | ICD-10-CM | POA: Diagnosis not present

## 2022-10-28 DIAGNOSIS — Z7901 Long term (current) use of anticoagulants: Secondary | ICD-10-CM | POA: Diagnosis not present

## 2022-10-28 DIAGNOSIS — I4891 Unspecified atrial fibrillation: Secondary | ICD-10-CM | POA: Diagnosis not present

## 2022-10-28 DIAGNOSIS — D649 Anemia, unspecified: Secondary | ICD-10-CM | POA: Diagnosis not present

## 2022-10-28 DIAGNOSIS — M810 Age-related osteoporosis without current pathological fracture: Secondary | ICD-10-CM | POA: Diagnosis not present

## 2022-10-28 DIAGNOSIS — I110 Hypertensive heart disease with heart failure: Secondary | ICD-10-CM | POA: Diagnosis not present

## 2022-10-28 DIAGNOSIS — J45909 Unspecified asthma, uncomplicated: Secondary | ICD-10-CM | POA: Diagnosis not present

## 2022-10-28 DIAGNOSIS — Z85820 Personal history of malignant melanoma of skin: Secondary | ICD-10-CM | POA: Diagnosis not present

## 2022-10-28 DIAGNOSIS — I509 Heart failure, unspecified: Secondary | ICD-10-CM | POA: Diagnosis not present

## 2022-10-28 DIAGNOSIS — M19071 Primary osteoarthritis, right ankle and foot: Secondary | ICD-10-CM | POA: Diagnosis not present

## 2022-10-31 DIAGNOSIS — D649 Anemia, unspecified: Secondary | ICD-10-CM | POA: Diagnosis not present

## 2022-10-31 DIAGNOSIS — I10 Essential (primary) hypertension: Secondary | ICD-10-CM | POA: Diagnosis not present

## 2022-10-31 DIAGNOSIS — M810 Age-related osteoporosis without current pathological fracture: Secondary | ICD-10-CM | POA: Diagnosis not present

## 2022-10-31 DIAGNOSIS — C210 Malignant neoplasm of anus, unspecified: Secondary | ICD-10-CM | POA: Diagnosis not present

## 2022-10-31 DIAGNOSIS — I509 Heart failure, unspecified: Secondary | ICD-10-CM | POA: Diagnosis not present

## 2022-10-31 DIAGNOSIS — Z7901 Long term (current) use of anticoagulants: Secondary | ICD-10-CM | POA: Diagnosis not present

## 2022-10-31 DIAGNOSIS — M79674 Pain in right toe(s): Secondary | ICD-10-CM | POA: Diagnosis not present

## 2022-10-31 DIAGNOSIS — M79675 Pain in left toe(s): Secondary | ICD-10-CM | POA: Diagnosis not present

## 2022-10-31 DIAGNOSIS — I739 Peripheral vascular disease, unspecified: Secondary | ICD-10-CM | POA: Diagnosis not present

## 2022-10-31 DIAGNOSIS — L603 Nail dystrophy: Secondary | ICD-10-CM | POA: Diagnosis not present

## 2022-10-31 DIAGNOSIS — Z85820 Personal history of malignant melanoma of skin: Secondary | ICD-10-CM | POA: Diagnosis not present

## 2022-10-31 DIAGNOSIS — I4891 Unspecified atrial fibrillation: Secondary | ICD-10-CM | POA: Diagnosis not present

## 2022-10-31 DIAGNOSIS — M19071 Primary osteoarthritis, right ankle and foot: Secondary | ICD-10-CM | POA: Diagnosis not present

## 2022-10-31 DIAGNOSIS — I110 Hypertensive heart disease with heart failure: Secondary | ICD-10-CM | POA: Diagnosis not present

## 2022-10-31 DIAGNOSIS — J45909 Unspecified asthma, uncomplicated: Secondary | ICD-10-CM | POA: Diagnosis not present

## 2022-10-31 DIAGNOSIS — B351 Tinea unguium: Secondary | ICD-10-CM | POA: Diagnosis not present

## 2022-10-31 DIAGNOSIS — I5020 Unspecified systolic (congestive) heart failure: Secondary | ICD-10-CM | POA: Diagnosis not present

## 2022-11-01 DIAGNOSIS — Z7901 Long term (current) use of anticoagulants: Secondary | ICD-10-CM | POA: Diagnosis not present

## 2022-11-01 DIAGNOSIS — M19071 Primary osteoarthritis, right ankle and foot: Secondary | ICD-10-CM | POA: Diagnosis not present

## 2022-11-01 DIAGNOSIS — Z85820 Personal history of malignant melanoma of skin: Secondary | ICD-10-CM | POA: Diagnosis not present

## 2022-11-01 DIAGNOSIS — I509 Heart failure, unspecified: Secondary | ICD-10-CM | POA: Diagnosis not present

## 2022-11-01 DIAGNOSIS — D649 Anemia, unspecified: Secondary | ICD-10-CM | POA: Diagnosis not present

## 2022-11-01 DIAGNOSIS — I4891 Unspecified atrial fibrillation: Secondary | ICD-10-CM | POA: Diagnosis not present

## 2022-11-01 DIAGNOSIS — I110 Hypertensive heart disease with heart failure: Secondary | ICD-10-CM | POA: Diagnosis not present

## 2022-11-01 DIAGNOSIS — J45909 Unspecified asthma, uncomplicated: Secondary | ICD-10-CM | POA: Diagnosis not present

## 2022-11-01 DIAGNOSIS — M810 Age-related osteoporosis without current pathological fracture: Secondary | ICD-10-CM | POA: Diagnosis not present

## 2022-11-03 DIAGNOSIS — Z85820 Personal history of malignant melanoma of skin: Secondary | ICD-10-CM | POA: Diagnosis not present

## 2022-11-03 DIAGNOSIS — J45909 Unspecified asthma, uncomplicated: Secondary | ICD-10-CM | POA: Diagnosis not present

## 2022-11-03 DIAGNOSIS — I4891 Unspecified atrial fibrillation: Secondary | ICD-10-CM | POA: Diagnosis not present

## 2022-11-03 DIAGNOSIS — Z7901 Long term (current) use of anticoagulants: Secondary | ICD-10-CM | POA: Diagnosis not present

## 2022-11-03 DIAGNOSIS — I509 Heart failure, unspecified: Secondary | ICD-10-CM | POA: Diagnosis not present

## 2022-11-03 DIAGNOSIS — D649 Anemia, unspecified: Secondary | ICD-10-CM | POA: Diagnosis not present

## 2022-11-03 DIAGNOSIS — M810 Age-related osteoporosis without current pathological fracture: Secondary | ICD-10-CM | POA: Diagnosis not present

## 2022-11-03 DIAGNOSIS — I110 Hypertensive heart disease with heart failure: Secondary | ICD-10-CM | POA: Diagnosis not present

## 2022-11-03 DIAGNOSIS — M19071 Primary osteoarthritis, right ankle and foot: Secondary | ICD-10-CM | POA: Diagnosis not present

## 2022-11-04 DIAGNOSIS — Z85820 Personal history of malignant melanoma of skin: Secondary | ICD-10-CM | POA: Diagnosis not present

## 2022-11-04 DIAGNOSIS — J45909 Unspecified asthma, uncomplicated: Secondary | ICD-10-CM | POA: Diagnosis not present

## 2022-11-04 DIAGNOSIS — I509 Heart failure, unspecified: Secondary | ICD-10-CM | POA: Diagnosis not present

## 2022-11-04 DIAGNOSIS — I4891 Unspecified atrial fibrillation: Secondary | ICD-10-CM | POA: Diagnosis not present

## 2022-11-04 DIAGNOSIS — Z7901 Long term (current) use of anticoagulants: Secondary | ICD-10-CM | POA: Diagnosis not present

## 2022-11-04 DIAGNOSIS — D649 Anemia, unspecified: Secondary | ICD-10-CM | POA: Diagnosis not present

## 2022-11-04 DIAGNOSIS — I110 Hypertensive heart disease with heart failure: Secondary | ICD-10-CM | POA: Diagnosis not present

## 2022-11-04 DIAGNOSIS — M19071 Primary osteoarthritis, right ankle and foot: Secondary | ICD-10-CM | POA: Diagnosis not present

## 2022-11-04 DIAGNOSIS — M810 Age-related osteoporosis without current pathological fracture: Secondary | ICD-10-CM | POA: Diagnosis not present

## 2022-11-05 DIAGNOSIS — D649 Anemia, unspecified: Secondary | ICD-10-CM | POA: Diagnosis not present

## 2022-11-05 DIAGNOSIS — Z85820 Personal history of malignant melanoma of skin: Secondary | ICD-10-CM | POA: Diagnosis not present

## 2022-11-05 DIAGNOSIS — I4891 Unspecified atrial fibrillation: Secondary | ICD-10-CM | POA: Diagnosis not present

## 2022-11-05 DIAGNOSIS — J45909 Unspecified asthma, uncomplicated: Secondary | ICD-10-CM | POA: Diagnosis not present

## 2022-11-05 DIAGNOSIS — Z7901 Long term (current) use of anticoagulants: Secondary | ICD-10-CM | POA: Diagnosis not present

## 2022-11-05 DIAGNOSIS — I110 Hypertensive heart disease with heart failure: Secondary | ICD-10-CM | POA: Diagnosis not present

## 2022-11-05 DIAGNOSIS — M810 Age-related osteoporosis without current pathological fracture: Secondary | ICD-10-CM | POA: Diagnosis not present

## 2022-11-05 DIAGNOSIS — M19071 Primary osteoarthritis, right ankle and foot: Secondary | ICD-10-CM | POA: Diagnosis not present

## 2022-11-05 DIAGNOSIS — I509 Heart failure, unspecified: Secondary | ICD-10-CM | POA: Diagnosis not present

## 2022-11-07 DIAGNOSIS — I4891 Unspecified atrial fibrillation: Secondary | ICD-10-CM | POA: Diagnosis not present

## 2022-11-07 DIAGNOSIS — I5020 Unspecified systolic (congestive) heart failure: Secondary | ICD-10-CM | POA: Diagnosis not present

## 2022-11-07 DIAGNOSIS — J45909 Unspecified asthma, uncomplicated: Secondary | ICD-10-CM | POA: Diagnosis not present

## 2022-11-07 DIAGNOSIS — D649 Anemia, unspecified: Secondary | ICD-10-CM | POA: Diagnosis not present

## 2022-11-07 DIAGNOSIS — Z7901 Long term (current) use of anticoagulants: Secondary | ICD-10-CM | POA: Diagnosis not present

## 2022-11-07 DIAGNOSIS — I509 Heart failure, unspecified: Secondary | ICD-10-CM | POA: Diagnosis not present

## 2022-11-07 DIAGNOSIS — Z85820 Personal history of malignant melanoma of skin: Secondary | ICD-10-CM | POA: Diagnosis not present

## 2022-11-07 DIAGNOSIS — M19071 Primary osteoarthritis, right ankle and foot: Secondary | ICD-10-CM | POA: Diagnosis not present

## 2022-11-07 DIAGNOSIS — M810 Age-related osteoporosis without current pathological fracture: Secondary | ICD-10-CM | POA: Diagnosis not present

## 2022-11-07 DIAGNOSIS — I110 Hypertensive heart disease with heart failure: Secondary | ICD-10-CM | POA: Diagnosis not present

## 2022-11-08 DIAGNOSIS — J45909 Unspecified asthma, uncomplicated: Secondary | ICD-10-CM | POA: Diagnosis not present

## 2022-11-08 DIAGNOSIS — M19071 Primary osteoarthritis, right ankle and foot: Secondary | ICD-10-CM | POA: Diagnosis not present

## 2022-11-08 DIAGNOSIS — I4891 Unspecified atrial fibrillation: Secondary | ICD-10-CM | POA: Diagnosis not present

## 2022-11-08 DIAGNOSIS — D649 Anemia, unspecified: Secondary | ICD-10-CM | POA: Diagnosis not present

## 2022-11-08 DIAGNOSIS — I110 Hypertensive heart disease with heart failure: Secondary | ICD-10-CM | POA: Diagnosis not present

## 2022-11-08 DIAGNOSIS — Z7901 Long term (current) use of anticoagulants: Secondary | ICD-10-CM | POA: Diagnosis not present

## 2022-11-08 DIAGNOSIS — Z85820 Personal history of malignant melanoma of skin: Secondary | ICD-10-CM | POA: Diagnosis not present

## 2022-11-08 DIAGNOSIS — I509 Heart failure, unspecified: Secondary | ICD-10-CM | POA: Diagnosis not present

## 2022-11-08 DIAGNOSIS — M810 Age-related osteoporosis without current pathological fracture: Secondary | ICD-10-CM | POA: Diagnosis not present

## 2022-11-09 DIAGNOSIS — Z85820 Personal history of malignant melanoma of skin: Secondary | ICD-10-CM | POA: Diagnosis not present

## 2022-11-09 DIAGNOSIS — Z7901 Long term (current) use of anticoagulants: Secondary | ICD-10-CM | POA: Diagnosis not present

## 2022-11-09 DIAGNOSIS — M19071 Primary osteoarthritis, right ankle and foot: Secondary | ICD-10-CM | POA: Diagnosis not present

## 2022-11-09 DIAGNOSIS — M810 Age-related osteoporosis without current pathological fracture: Secondary | ICD-10-CM | POA: Diagnosis not present

## 2022-11-09 DIAGNOSIS — I509 Heart failure, unspecified: Secondary | ICD-10-CM | POA: Diagnosis not present

## 2022-11-09 DIAGNOSIS — I110 Hypertensive heart disease with heart failure: Secondary | ICD-10-CM | POA: Diagnosis not present

## 2022-11-09 DIAGNOSIS — I4891 Unspecified atrial fibrillation: Secondary | ICD-10-CM | POA: Diagnosis not present

## 2022-11-09 DIAGNOSIS — J45909 Unspecified asthma, uncomplicated: Secondary | ICD-10-CM | POA: Diagnosis not present

## 2022-11-09 DIAGNOSIS — D649 Anemia, unspecified: Secondary | ICD-10-CM | POA: Diagnosis not present

## 2022-11-10 DIAGNOSIS — D649 Anemia, unspecified: Secondary | ICD-10-CM | POA: Diagnosis not present

## 2022-11-10 DIAGNOSIS — I4891 Unspecified atrial fibrillation: Secondary | ICD-10-CM | POA: Diagnosis not present

## 2022-11-10 DIAGNOSIS — Z7901 Long term (current) use of anticoagulants: Secondary | ICD-10-CM | POA: Diagnosis not present

## 2022-11-10 DIAGNOSIS — M19071 Primary osteoarthritis, right ankle and foot: Secondary | ICD-10-CM | POA: Diagnosis not present

## 2022-11-10 DIAGNOSIS — I509 Heart failure, unspecified: Secondary | ICD-10-CM | POA: Diagnosis not present

## 2022-11-10 DIAGNOSIS — M810 Age-related osteoporosis without current pathological fracture: Secondary | ICD-10-CM | POA: Diagnosis not present

## 2022-11-10 DIAGNOSIS — Z85820 Personal history of malignant melanoma of skin: Secondary | ICD-10-CM | POA: Diagnosis not present

## 2022-11-10 DIAGNOSIS — J45909 Unspecified asthma, uncomplicated: Secondary | ICD-10-CM | POA: Diagnosis not present

## 2022-11-10 DIAGNOSIS — I110 Hypertensive heart disease with heart failure: Secondary | ICD-10-CM | POA: Diagnosis not present

## 2022-11-14 DIAGNOSIS — D649 Anemia, unspecified: Secondary | ICD-10-CM | POA: Diagnosis not present

## 2022-11-14 DIAGNOSIS — Z7901 Long term (current) use of anticoagulants: Secondary | ICD-10-CM | POA: Diagnosis not present

## 2022-11-14 DIAGNOSIS — J45909 Unspecified asthma, uncomplicated: Secondary | ICD-10-CM | POA: Diagnosis not present

## 2022-11-14 DIAGNOSIS — M810 Age-related osteoporosis without current pathological fracture: Secondary | ICD-10-CM | POA: Diagnosis not present

## 2022-11-14 DIAGNOSIS — M19071 Primary osteoarthritis, right ankle and foot: Secondary | ICD-10-CM | POA: Diagnosis not present

## 2022-11-14 DIAGNOSIS — I4891 Unspecified atrial fibrillation: Secondary | ICD-10-CM | POA: Diagnosis not present

## 2022-11-14 DIAGNOSIS — Z85820 Personal history of malignant melanoma of skin: Secondary | ICD-10-CM | POA: Diagnosis not present

## 2022-11-14 DIAGNOSIS — I110 Hypertensive heart disease with heart failure: Secondary | ICD-10-CM | POA: Diagnosis not present

## 2022-11-14 DIAGNOSIS — R339 Retention of urine, unspecified: Secondary | ICD-10-CM | POA: Diagnosis not present

## 2022-11-14 DIAGNOSIS — I509 Heart failure, unspecified: Secondary | ICD-10-CM | POA: Diagnosis not present

## 2022-11-15 DIAGNOSIS — I4891 Unspecified atrial fibrillation: Secondary | ICD-10-CM | POA: Diagnosis not present

## 2022-11-15 DIAGNOSIS — D649 Anemia, unspecified: Secondary | ICD-10-CM | POA: Diagnosis not present

## 2022-11-15 DIAGNOSIS — M810 Age-related osteoporosis without current pathological fracture: Secondary | ICD-10-CM | POA: Diagnosis not present

## 2022-11-15 DIAGNOSIS — M19071 Primary osteoarthritis, right ankle and foot: Secondary | ICD-10-CM | POA: Diagnosis not present

## 2022-11-15 DIAGNOSIS — Z85820 Personal history of malignant melanoma of skin: Secondary | ICD-10-CM | POA: Diagnosis not present

## 2022-11-15 DIAGNOSIS — I509 Heart failure, unspecified: Secondary | ICD-10-CM | POA: Diagnosis not present

## 2022-11-15 DIAGNOSIS — J45909 Unspecified asthma, uncomplicated: Secondary | ICD-10-CM | POA: Diagnosis not present

## 2022-11-15 DIAGNOSIS — I110 Hypertensive heart disease with heart failure: Secondary | ICD-10-CM | POA: Diagnosis not present

## 2022-11-15 DIAGNOSIS — Z7901 Long term (current) use of anticoagulants: Secondary | ICD-10-CM | POA: Diagnosis not present

## 2022-11-16 DIAGNOSIS — J45909 Unspecified asthma, uncomplicated: Secondary | ICD-10-CM | POA: Diagnosis not present

## 2022-11-16 DIAGNOSIS — Z7901 Long term (current) use of anticoagulants: Secondary | ICD-10-CM | POA: Diagnosis not present

## 2022-11-16 DIAGNOSIS — M810 Age-related osteoporosis without current pathological fracture: Secondary | ICD-10-CM | POA: Diagnosis not present

## 2022-11-16 DIAGNOSIS — Z85820 Personal history of malignant melanoma of skin: Secondary | ICD-10-CM | POA: Diagnosis not present

## 2022-11-16 DIAGNOSIS — I110 Hypertensive heart disease with heart failure: Secondary | ICD-10-CM | POA: Diagnosis not present

## 2022-11-16 DIAGNOSIS — D649 Anemia, unspecified: Secondary | ICD-10-CM | POA: Diagnosis not present

## 2022-11-16 DIAGNOSIS — M19071 Primary osteoarthritis, right ankle and foot: Secondary | ICD-10-CM | POA: Diagnosis not present

## 2022-11-16 DIAGNOSIS — I509 Heart failure, unspecified: Secondary | ICD-10-CM | POA: Diagnosis not present

## 2022-11-16 DIAGNOSIS — I4891 Unspecified atrial fibrillation: Secondary | ICD-10-CM | POA: Diagnosis not present

## 2022-11-17 DIAGNOSIS — D649 Anemia, unspecified: Secondary | ICD-10-CM | POA: Diagnosis not present

## 2022-11-17 DIAGNOSIS — M19071 Primary osteoarthritis, right ankle and foot: Secondary | ICD-10-CM | POA: Diagnosis not present

## 2022-11-17 DIAGNOSIS — I4891 Unspecified atrial fibrillation: Secondary | ICD-10-CM | POA: Diagnosis not present

## 2022-11-17 DIAGNOSIS — Z7901 Long term (current) use of anticoagulants: Secondary | ICD-10-CM | POA: Diagnosis not present

## 2022-11-17 DIAGNOSIS — Z85820 Personal history of malignant melanoma of skin: Secondary | ICD-10-CM | POA: Diagnosis not present

## 2022-11-17 DIAGNOSIS — J45909 Unspecified asthma, uncomplicated: Secondary | ICD-10-CM | POA: Diagnosis not present

## 2022-11-17 DIAGNOSIS — I110 Hypertensive heart disease with heart failure: Secondary | ICD-10-CM | POA: Diagnosis not present

## 2022-11-17 DIAGNOSIS — M810 Age-related osteoporosis without current pathological fracture: Secondary | ICD-10-CM | POA: Diagnosis not present

## 2022-11-17 DIAGNOSIS — I509 Heart failure, unspecified: Secondary | ICD-10-CM | POA: Diagnosis not present

## 2022-11-21 DIAGNOSIS — M810 Age-related osteoporosis without current pathological fracture: Secondary | ICD-10-CM | POA: Diagnosis not present

## 2022-11-21 DIAGNOSIS — M19071 Primary osteoarthritis, right ankle and foot: Secondary | ICD-10-CM | POA: Diagnosis not present

## 2022-11-21 DIAGNOSIS — J45909 Unspecified asthma, uncomplicated: Secondary | ICD-10-CM | POA: Diagnosis not present

## 2022-11-21 DIAGNOSIS — D649 Anemia, unspecified: Secondary | ICD-10-CM | POA: Diagnosis not present

## 2022-11-21 DIAGNOSIS — Z7901 Long term (current) use of anticoagulants: Secondary | ICD-10-CM | POA: Diagnosis not present

## 2022-11-21 DIAGNOSIS — I509 Heart failure, unspecified: Secondary | ICD-10-CM | POA: Diagnosis not present

## 2022-11-21 DIAGNOSIS — I110 Hypertensive heart disease with heart failure: Secondary | ICD-10-CM | POA: Diagnosis not present

## 2022-11-21 DIAGNOSIS — Z85820 Personal history of malignant melanoma of skin: Secondary | ICD-10-CM | POA: Diagnosis not present

## 2022-11-21 DIAGNOSIS — I4891 Unspecified atrial fibrillation: Secondary | ICD-10-CM | POA: Diagnosis not present

## 2022-11-23 DIAGNOSIS — I110 Hypertensive heart disease with heart failure: Secondary | ICD-10-CM | POA: Diagnosis not present

## 2022-11-23 DIAGNOSIS — M810 Age-related osteoporosis without current pathological fracture: Secondary | ICD-10-CM | POA: Diagnosis not present

## 2022-11-23 DIAGNOSIS — Z85820 Personal history of malignant melanoma of skin: Secondary | ICD-10-CM | POA: Diagnosis not present

## 2022-11-23 DIAGNOSIS — J45909 Unspecified asthma, uncomplicated: Secondary | ICD-10-CM | POA: Diagnosis not present

## 2022-11-23 DIAGNOSIS — I509 Heart failure, unspecified: Secondary | ICD-10-CM | POA: Diagnosis not present

## 2022-11-23 DIAGNOSIS — M19071 Primary osteoarthritis, right ankle and foot: Secondary | ICD-10-CM | POA: Diagnosis not present

## 2022-11-23 DIAGNOSIS — D649 Anemia, unspecified: Secondary | ICD-10-CM | POA: Diagnosis not present

## 2022-11-23 DIAGNOSIS — I4891 Unspecified atrial fibrillation: Secondary | ICD-10-CM | POA: Diagnosis not present

## 2022-11-23 DIAGNOSIS — Z7901 Long term (current) use of anticoagulants: Secondary | ICD-10-CM | POA: Diagnosis not present

## 2022-11-24 DIAGNOSIS — J45909 Unspecified asthma, uncomplicated: Secondary | ICD-10-CM | POA: Diagnosis not present

## 2022-11-24 DIAGNOSIS — I110 Hypertensive heart disease with heart failure: Secondary | ICD-10-CM | POA: Diagnosis not present

## 2022-11-24 DIAGNOSIS — D649 Anemia, unspecified: Secondary | ICD-10-CM | POA: Diagnosis not present

## 2022-11-24 DIAGNOSIS — Z7901 Long term (current) use of anticoagulants: Secondary | ICD-10-CM | POA: Diagnosis not present

## 2022-11-24 DIAGNOSIS — I509 Heart failure, unspecified: Secondary | ICD-10-CM | POA: Diagnosis not present

## 2022-11-24 DIAGNOSIS — M19071 Primary osteoarthritis, right ankle and foot: Secondary | ICD-10-CM | POA: Diagnosis not present

## 2022-11-24 DIAGNOSIS — M810 Age-related osteoporosis without current pathological fracture: Secondary | ICD-10-CM | POA: Diagnosis not present

## 2022-11-24 DIAGNOSIS — Z85820 Personal history of malignant melanoma of skin: Secondary | ICD-10-CM | POA: Diagnosis not present

## 2022-11-24 DIAGNOSIS — I4891 Unspecified atrial fibrillation: Secondary | ICD-10-CM | POA: Diagnosis not present

## 2022-11-25 DIAGNOSIS — Z85820 Personal history of malignant melanoma of skin: Secondary | ICD-10-CM | POA: Diagnosis not present

## 2022-11-25 DIAGNOSIS — J45909 Unspecified asthma, uncomplicated: Secondary | ICD-10-CM | POA: Diagnosis not present

## 2022-11-25 DIAGNOSIS — I10 Essential (primary) hypertension: Secondary | ICD-10-CM | POA: Diagnosis not present

## 2022-11-25 DIAGNOSIS — C210 Malignant neoplasm of anus, unspecified: Secondary | ICD-10-CM | POA: Diagnosis not present

## 2022-11-25 DIAGNOSIS — Z7901 Long term (current) use of anticoagulants: Secondary | ICD-10-CM | POA: Diagnosis not present

## 2022-11-25 DIAGNOSIS — Z23 Encounter for immunization: Secondary | ICD-10-CM | POA: Diagnosis not present

## 2022-11-25 DIAGNOSIS — M19071 Primary osteoarthritis, right ankle and foot: Secondary | ICD-10-CM | POA: Diagnosis not present

## 2022-11-25 DIAGNOSIS — D638 Anemia in other chronic diseases classified elsewhere: Secondary | ICD-10-CM | POA: Diagnosis not present

## 2022-11-25 DIAGNOSIS — I5042 Chronic combined systolic (congestive) and diastolic (congestive) heart failure: Secondary | ICD-10-CM | POA: Diagnosis not present

## 2022-11-25 DIAGNOSIS — I4891 Unspecified atrial fibrillation: Secondary | ICD-10-CM | POA: Diagnosis not present

## 2022-11-25 DIAGNOSIS — D649 Anemia, unspecified: Secondary | ICD-10-CM | POA: Diagnosis not present

## 2022-11-25 DIAGNOSIS — I110 Hypertensive heart disease with heart failure: Secondary | ICD-10-CM | POA: Diagnosis not present

## 2022-11-25 DIAGNOSIS — E7800 Pure hypercholesterolemia, unspecified: Secondary | ICD-10-CM | POA: Diagnosis not present

## 2022-11-25 DIAGNOSIS — M810 Age-related osteoporosis without current pathological fracture: Secondary | ICD-10-CM | POA: Diagnosis not present

## 2022-11-25 DIAGNOSIS — I509 Heart failure, unspecified: Secondary | ICD-10-CM | POA: Diagnosis not present

## 2022-11-25 DIAGNOSIS — R270 Ataxia, unspecified: Secondary | ICD-10-CM | POA: Diagnosis not present

## 2022-11-25 DIAGNOSIS — S32301D Unspecified fracture of right ilium, subsequent encounter for fracture with routine healing: Secondary | ICD-10-CM | POA: Diagnosis not present

## 2022-11-28 DIAGNOSIS — I110 Hypertensive heart disease with heart failure: Secondary | ICD-10-CM | POA: Diagnosis not present

## 2022-11-28 DIAGNOSIS — C210 Malignant neoplasm of anus, unspecified: Secondary | ICD-10-CM | POA: Diagnosis not present

## 2022-11-28 DIAGNOSIS — M19071 Primary osteoarthritis, right ankle and foot: Secondary | ICD-10-CM | POA: Diagnosis not present

## 2022-11-28 DIAGNOSIS — D649 Anemia, unspecified: Secondary | ICD-10-CM | POA: Diagnosis not present

## 2022-11-28 DIAGNOSIS — I5020 Unspecified systolic (congestive) heart failure: Secondary | ICD-10-CM | POA: Diagnosis not present

## 2022-11-28 DIAGNOSIS — I4891 Unspecified atrial fibrillation: Secondary | ICD-10-CM | POA: Diagnosis not present

## 2022-11-28 DIAGNOSIS — Z85820 Personal history of malignant melanoma of skin: Secondary | ICD-10-CM | POA: Diagnosis not present

## 2022-11-28 DIAGNOSIS — J45909 Unspecified asthma, uncomplicated: Secondary | ICD-10-CM | POA: Diagnosis not present

## 2022-11-28 DIAGNOSIS — I081 Rheumatic disorders of both mitral and tricuspid valves: Secondary | ICD-10-CM | POA: Diagnosis not present

## 2022-11-28 DIAGNOSIS — I10 Essential (primary) hypertension: Secondary | ICD-10-CM | POA: Diagnosis not present

## 2022-11-28 DIAGNOSIS — I509 Heart failure, unspecified: Secondary | ICD-10-CM | POA: Diagnosis not present

## 2022-11-28 DIAGNOSIS — R0609 Other forms of dyspnea: Secondary | ICD-10-CM | POA: Diagnosis not present

## 2022-11-28 DIAGNOSIS — Z7901 Long term (current) use of anticoagulants: Secondary | ICD-10-CM | POA: Diagnosis not present

## 2022-11-28 DIAGNOSIS — M810 Age-related osteoporosis without current pathological fracture: Secondary | ICD-10-CM | POA: Diagnosis not present

## 2022-11-29 DIAGNOSIS — Z7901 Long term (current) use of anticoagulants: Secondary | ICD-10-CM | POA: Diagnosis not present

## 2022-11-29 DIAGNOSIS — M19071 Primary osteoarthritis, right ankle and foot: Secondary | ICD-10-CM | POA: Diagnosis not present

## 2022-11-29 DIAGNOSIS — D649 Anemia, unspecified: Secondary | ICD-10-CM | POA: Diagnosis not present

## 2022-11-29 DIAGNOSIS — Z85820 Personal history of malignant melanoma of skin: Secondary | ICD-10-CM | POA: Diagnosis not present

## 2022-11-29 DIAGNOSIS — I110 Hypertensive heart disease with heart failure: Secondary | ICD-10-CM | POA: Diagnosis not present

## 2022-11-29 DIAGNOSIS — I509 Heart failure, unspecified: Secondary | ICD-10-CM | POA: Diagnosis not present

## 2022-11-29 DIAGNOSIS — M810 Age-related osteoporosis without current pathological fracture: Secondary | ICD-10-CM | POA: Diagnosis not present

## 2022-11-29 DIAGNOSIS — J45909 Unspecified asthma, uncomplicated: Secondary | ICD-10-CM | POA: Diagnosis not present

## 2022-11-29 DIAGNOSIS — I4891 Unspecified atrial fibrillation: Secondary | ICD-10-CM | POA: Diagnosis not present

## 2022-11-30 DIAGNOSIS — I4891 Unspecified atrial fibrillation: Secondary | ICD-10-CM | POA: Diagnosis not present

## 2022-11-30 DIAGNOSIS — M810 Age-related osteoporosis without current pathological fracture: Secondary | ICD-10-CM | POA: Diagnosis not present

## 2022-11-30 DIAGNOSIS — Z7901 Long term (current) use of anticoagulants: Secondary | ICD-10-CM | POA: Diagnosis not present

## 2022-11-30 DIAGNOSIS — M19071 Primary osteoarthritis, right ankle and foot: Secondary | ICD-10-CM | POA: Diagnosis not present

## 2022-11-30 DIAGNOSIS — Z85820 Personal history of malignant melanoma of skin: Secondary | ICD-10-CM | POA: Diagnosis not present

## 2022-11-30 DIAGNOSIS — I110 Hypertensive heart disease with heart failure: Secondary | ICD-10-CM | POA: Diagnosis not present

## 2022-11-30 DIAGNOSIS — J45909 Unspecified asthma, uncomplicated: Secondary | ICD-10-CM | POA: Diagnosis not present

## 2022-11-30 DIAGNOSIS — I509 Heart failure, unspecified: Secondary | ICD-10-CM | POA: Diagnosis not present

## 2022-11-30 DIAGNOSIS — D649 Anemia, unspecified: Secondary | ICD-10-CM | POA: Diagnosis not present

## 2022-12-01 DIAGNOSIS — I110 Hypertensive heart disease with heart failure: Secondary | ICD-10-CM | POA: Diagnosis not present

## 2022-12-01 DIAGNOSIS — D649 Anemia, unspecified: Secondary | ICD-10-CM | POA: Diagnosis not present

## 2022-12-01 DIAGNOSIS — I509 Heart failure, unspecified: Secondary | ICD-10-CM | POA: Diagnosis not present

## 2022-12-01 DIAGNOSIS — M19071 Primary osteoarthritis, right ankle and foot: Secondary | ICD-10-CM | POA: Diagnosis not present

## 2022-12-01 DIAGNOSIS — Z7901 Long term (current) use of anticoagulants: Secondary | ICD-10-CM | POA: Diagnosis not present

## 2022-12-01 DIAGNOSIS — Z85820 Personal history of malignant melanoma of skin: Secondary | ICD-10-CM | POA: Diagnosis not present

## 2022-12-01 DIAGNOSIS — M810 Age-related osteoporosis without current pathological fracture: Secondary | ICD-10-CM | POA: Diagnosis not present

## 2022-12-01 DIAGNOSIS — I4891 Unspecified atrial fibrillation: Secondary | ICD-10-CM | POA: Diagnosis not present

## 2022-12-01 DIAGNOSIS — J45909 Unspecified asthma, uncomplicated: Secondary | ICD-10-CM | POA: Diagnosis not present

## 2022-12-05 DIAGNOSIS — I110 Hypertensive heart disease with heart failure: Secondary | ICD-10-CM | POA: Diagnosis not present

## 2022-12-05 DIAGNOSIS — D649 Anemia, unspecified: Secondary | ICD-10-CM | POA: Diagnosis not present

## 2022-12-05 DIAGNOSIS — Z7901 Long term (current) use of anticoagulants: Secondary | ICD-10-CM | POA: Diagnosis not present

## 2022-12-05 DIAGNOSIS — J45909 Unspecified asthma, uncomplicated: Secondary | ICD-10-CM | POA: Diagnosis not present

## 2022-12-05 DIAGNOSIS — I4891 Unspecified atrial fibrillation: Secondary | ICD-10-CM | POA: Diagnosis not present

## 2022-12-05 DIAGNOSIS — Z85820 Personal history of malignant melanoma of skin: Secondary | ICD-10-CM | POA: Diagnosis not present

## 2022-12-05 DIAGNOSIS — M19071 Primary osteoarthritis, right ankle and foot: Secondary | ICD-10-CM | POA: Diagnosis not present

## 2022-12-05 DIAGNOSIS — I509 Heart failure, unspecified: Secondary | ICD-10-CM | POA: Diagnosis not present

## 2022-12-05 DIAGNOSIS — M810 Age-related osteoporosis without current pathological fracture: Secondary | ICD-10-CM | POA: Diagnosis not present

## 2022-12-06 DIAGNOSIS — I509 Heart failure, unspecified: Secondary | ICD-10-CM | POA: Diagnosis not present

## 2022-12-06 DIAGNOSIS — R300 Dysuria: Secondary | ICD-10-CM | POA: Diagnosis not present

## 2022-12-06 DIAGNOSIS — Z7901 Long term (current) use of anticoagulants: Secondary | ICD-10-CM | POA: Diagnosis not present

## 2022-12-06 DIAGNOSIS — M810 Age-related osteoporosis without current pathological fracture: Secondary | ICD-10-CM | POA: Diagnosis not present

## 2022-12-06 DIAGNOSIS — M19071 Primary osteoarthritis, right ankle and foot: Secondary | ICD-10-CM | POA: Diagnosis not present

## 2022-12-06 DIAGNOSIS — N390 Urinary tract infection, site not specified: Secondary | ICD-10-CM | POA: Diagnosis not present

## 2022-12-06 DIAGNOSIS — J45909 Unspecified asthma, uncomplicated: Secondary | ICD-10-CM | POA: Diagnosis not present

## 2022-12-06 DIAGNOSIS — Z85820 Personal history of malignant melanoma of skin: Secondary | ICD-10-CM | POA: Diagnosis not present

## 2022-12-06 DIAGNOSIS — I110 Hypertensive heart disease with heart failure: Secondary | ICD-10-CM | POA: Diagnosis not present

## 2022-12-06 DIAGNOSIS — I4891 Unspecified atrial fibrillation: Secondary | ICD-10-CM | POA: Diagnosis not present

## 2022-12-06 DIAGNOSIS — D649 Anemia, unspecified: Secondary | ICD-10-CM | POA: Diagnosis not present

## 2022-12-07 DIAGNOSIS — I509 Heart failure, unspecified: Secondary | ICD-10-CM | POA: Diagnosis not present

## 2022-12-07 DIAGNOSIS — I110 Hypertensive heart disease with heart failure: Secondary | ICD-10-CM | POA: Diagnosis not present

## 2022-12-07 DIAGNOSIS — I4891 Unspecified atrial fibrillation: Secondary | ICD-10-CM | POA: Diagnosis not present

## 2022-12-07 DIAGNOSIS — M19071 Primary osteoarthritis, right ankle and foot: Secondary | ICD-10-CM | POA: Diagnosis not present

## 2022-12-07 DIAGNOSIS — Z85820 Personal history of malignant melanoma of skin: Secondary | ICD-10-CM | POA: Diagnosis not present

## 2022-12-07 DIAGNOSIS — D649 Anemia, unspecified: Secondary | ICD-10-CM | POA: Diagnosis not present

## 2022-12-07 DIAGNOSIS — J45909 Unspecified asthma, uncomplicated: Secondary | ICD-10-CM | POA: Diagnosis not present

## 2022-12-07 DIAGNOSIS — Z7901 Long term (current) use of anticoagulants: Secondary | ICD-10-CM | POA: Diagnosis not present

## 2022-12-07 DIAGNOSIS — M810 Age-related osteoporosis without current pathological fracture: Secondary | ICD-10-CM | POA: Diagnosis not present

## 2022-12-08 DIAGNOSIS — I110 Hypertensive heart disease with heart failure: Secondary | ICD-10-CM | POA: Diagnosis not present

## 2022-12-08 DIAGNOSIS — J45909 Unspecified asthma, uncomplicated: Secondary | ICD-10-CM | POA: Diagnosis not present

## 2022-12-08 DIAGNOSIS — I4891 Unspecified atrial fibrillation: Secondary | ICD-10-CM | POA: Diagnosis not present

## 2022-12-08 DIAGNOSIS — M19071 Primary osteoarthritis, right ankle and foot: Secondary | ICD-10-CM | POA: Diagnosis not present

## 2022-12-08 DIAGNOSIS — I509 Heart failure, unspecified: Secondary | ICD-10-CM | POA: Diagnosis not present

## 2022-12-08 DIAGNOSIS — Z7901 Long term (current) use of anticoagulants: Secondary | ICD-10-CM | POA: Diagnosis not present

## 2022-12-08 DIAGNOSIS — D649 Anemia, unspecified: Secondary | ICD-10-CM | POA: Diagnosis not present

## 2022-12-08 DIAGNOSIS — Z85820 Personal history of malignant melanoma of skin: Secondary | ICD-10-CM | POA: Diagnosis not present

## 2022-12-08 DIAGNOSIS — M810 Age-related osteoporosis without current pathological fracture: Secondary | ICD-10-CM | POA: Diagnosis not present

## 2022-12-12 DIAGNOSIS — E611 Iron deficiency: Secondary | ICD-10-CM | POA: Diagnosis not present

## 2022-12-12 DIAGNOSIS — C210 Malignant neoplasm of anus, unspecified: Secondary | ICD-10-CM | POA: Diagnosis not present

## 2022-12-12 DIAGNOSIS — I10 Essential (primary) hypertension: Secondary | ICD-10-CM | POA: Diagnosis not present

## 2022-12-12 DIAGNOSIS — D649 Anemia, unspecified: Secondary | ICD-10-CM | POA: Diagnosis not present

## 2022-12-13 DIAGNOSIS — L578 Other skin changes due to chronic exposure to nonionizing radiation: Secondary | ICD-10-CM | POA: Diagnosis not present

## 2022-12-13 DIAGNOSIS — L570 Actinic keratosis: Secondary | ICD-10-CM | POA: Diagnosis not present

## 2022-12-13 DIAGNOSIS — I509 Heart failure, unspecified: Secondary | ICD-10-CM | POA: Diagnosis not present

## 2022-12-13 DIAGNOSIS — L821 Other seborrheic keratosis: Secondary | ICD-10-CM | POA: Diagnosis not present

## 2022-12-13 DIAGNOSIS — J45909 Unspecified asthma, uncomplicated: Secondary | ICD-10-CM | POA: Diagnosis not present

## 2022-12-13 DIAGNOSIS — C44311 Basal cell carcinoma of skin of nose: Secondary | ICD-10-CM | POA: Diagnosis not present

## 2022-12-13 DIAGNOSIS — Z7901 Long term (current) use of anticoagulants: Secondary | ICD-10-CM | POA: Diagnosis not present

## 2022-12-13 DIAGNOSIS — Z85820 Personal history of malignant melanoma of skin: Secondary | ICD-10-CM | POA: Diagnosis not present

## 2022-12-13 DIAGNOSIS — I4891 Unspecified atrial fibrillation: Secondary | ICD-10-CM | POA: Diagnosis not present

## 2022-12-13 DIAGNOSIS — D485 Neoplasm of uncertain behavior of skin: Secondary | ICD-10-CM | POA: Diagnosis not present

## 2022-12-13 DIAGNOSIS — I110 Hypertensive heart disease with heart failure: Secondary | ICD-10-CM | POA: Diagnosis not present

## 2022-12-13 DIAGNOSIS — D649 Anemia, unspecified: Secondary | ICD-10-CM | POA: Diagnosis not present

## 2022-12-13 DIAGNOSIS — M19071 Primary osteoarthritis, right ankle and foot: Secondary | ICD-10-CM | POA: Diagnosis not present

## 2022-12-13 DIAGNOSIS — C44329 Squamous cell carcinoma of skin of other parts of face: Secondary | ICD-10-CM | POA: Diagnosis not present

## 2022-12-13 DIAGNOSIS — M810 Age-related osteoporosis without current pathological fracture: Secondary | ICD-10-CM | POA: Diagnosis not present

## 2022-12-15 DIAGNOSIS — M810 Age-related osteoporosis without current pathological fracture: Secondary | ICD-10-CM | POA: Diagnosis not present

## 2022-12-15 DIAGNOSIS — I509 Heart failure, unspecified: Secondary | ICD-10-CM | POA: Diagnosis not present

## 2022-12-15 DIAGNOSIS — Z85820 Personal history of malignant melanoma of skin: Secondary | ICD-10-CM | POA: Diagnosis not present

## 2022-12-15 DIAGNOSIS — J45909 Unspecified asthma, uncomplicated: Secondary | ICD-10-CM | POA: Diagnosis not present

## 2022-12-15 DIAGNOSIS — I110 Hypertensive heart disease with heart failure: Secondary | ICD-10-CM | POA: Diagnosis not present

## 2022-12-15 DIAGNOSIS — I4891 Unspecified atrial fibrillation: Secondary | ICD-10-CM | POA: Diagnosis not present

## 2022-12-15 DIAGNOSIS — M19071 Primary osteoarthritis, right ankle and foot: Secondary | ICD-10-CM | POA: Diagnosis not present

## 2022-12-15 DIAGNOSIS — D649 Anemia, unspecified: Secondary | ICD-10-CM | POA: Diagnosis not present

## 2022-12-15 DIAGNOSIS — Z7901 Long term (current) use of anticoagulants: Secondary | ICD-10-CM | POA: Diagnosis not present

## 2022-12-20 DIAGNOSIS — J45909 Unspecified asthma, uncomplicated: Secondary | ICD-10-CM | POA: Diagnosis not present

## 2022-12-20 DIAGNOSIS — Z85820 Personal history of malignant melanoma of skin: Secondary | ICD-10-CM | POA: Diagnosis not present

## 2022-12-20 DIAGNOSIS — M810 Age-related osteoporosis without current pathological fracture: Secondary | ICD-10-CM | POA: Diagnosis not present

## 2022-12-20 DIAGNOSIS — I509 Heart failure, unspecified: Secondary | ICD-10-CM | POA: Diagnosis not present

## 2022-12-20 DIAGNOSIS — D649 Anemia, unspecified: Secondary | ICD-10-CM | POA: Diagnosis not present

## 2022-12-20 DIAGNOSIS — I4891 Unspecified atrial fibrillation: Secondary | ICD-10-CM | POA: Diagnosis not present

## 2022-12-20 DIAGNOSIS — Z7901 Long term (current) use of anticoagulants: Secondary | ICD-10-CM | POA: Diagnosis not present

## 2022-12-20 DIAGNOSIS — I110 Hypertensive heart disease with heart failure: Secondary | ICD-10-CM | POA: Diagnosis not present

## 2022-12-20 DIAGNOSIS — M19071 Primary osteoarthritis, right ankle and foot: Secondary | ICD-10-CM | POA: Diagnosis not present

## 2022-12-21 DIAGNOSIS — I4891 Unspecified atrial fibrillation: Secondary | ICD-10-CM | POA: Diagnosis not present

## 2022-12-21 DIAGNOSIS — Z85820 Personal history of malignant melanoma of skin: Secondary | ICD-10-CM | POA: Diagnosis not present

## 2022-12-21 DIAGNOSIS — M810 Age-related osteoporosis without current pathological fracture: Secondary | ICD-10-CM | POA: Diagnosis not present

## 2022-12-21 DIAGNOSIS — I509 Heart failure, unspecified: Secondary | ICD-10-CM | POA: Diagnosis not present

## 2022-12-21 DIAGNOSIS — J45909 Unspecified asthma, uncomplicated: Secondary | ICD-10-CM | POA: Diagnosis not present

## 2022-12-21 DIAGNOSIS — M19071 Primary osteoarthritis, right ankle and foot: Secondary | ICD-10-CM | POA: Diagnosis not present

## 2022-12-21 DIAGNOSIS — D649 Anemia, unspecified: Secondary | ICD-10-CM | POA: Diagnosis not present

## 2022-12-21 DIAGNOSIS — Z7901 Long term (current) use of anticoagulants: Secondary | ICD-10-CM | POA: Diagnosis not present

## 2022-12-21 DIAGNOSIS — I110 Hypertensive heart disease with heart failure: Secondary | ICD-10-CM | POA: Diagnosis not present

## 2022-12-22 DIAGNOSIS — D649 Anemia, unspecified: Secondary | ICD-10-CM | POA: Diagnosis not present

## 2022-12-22 DIAGNOSIS — Z85820 Personal history of malignant melanoma of skin: Secondary | ICD-10-CM | POA: Diagnosis not present

## 2022-12-22 DIAGNOSIS — M19071 Primary osteoarthritis, right ankle and foot: Secondary | ICD-10-CM | POA: Diagnosis not present

## 2022-12-22 DIAGNOSIS — Z7901 Long term (current) use of anticoagulants: Secondary | ICD-10-CM | POA: Diagnosis not present

## 2022-12-22 DIAGNOSIS — M810 Age-related osteoporosis without current pathological fracture: Secondary | ICD-10-CM | POA: Diagnosis not present

## 2022-12-22 DIAGNOSIS — I110 Hypertensive heart disease with heart failure: Secondary | ICD-10-CM | POA: Diagnosis not present

## 2022-12-22 DIAGNOSIS — I509 Heart failure, unspecified: Secondary | ICD-10-CM | POA: Diagnosis not present

## 2022-12-22 DIAGNOSIS — I4891 Unspecified atrial fibrillation: Secondary | ICD-10-CM | POA: Diagnosis not present

## 2022-12-22 DIAGNOSIS — J45909 Unspecified asthma, uncomplicated: Secondary | ICD-10-CM | POA: Diagnosis not present

## 2023-01-04 DIAGNOSIS — C44619 Basal cell carcinoma of skin of left upper limb, including shoulder: Secondary | ICD-10-CM | POA: Diagnosis not present

## 2023-01-04 DIAGNOSIS — L578 Other skin changes due to chronic exposure to nonionizing radiation: Secondary | ICD-10-CM | POA: Diagnosis not present

## 2023-01-04 DIAGNOSIS — C44629 Squamous cell carcinoma of skin of left upper limb, including shoulder: Secondary | ICD-10-CM | POA: Diagnosis not present

## 2023-01-04 DIAGNOSIS — D485 Neoplasm of uncertain behavior of skin: Secondary | ICD-10-CM | POA: Diagnosis not present

## 2023-01-04 DIAGNOSIS — D0439 Carcinoma in situ of skin of other parts of face: Secondary | ICD-10-CM | POA: Diagnosis not present

## 2023-01-05 DIAGNOSIS — C44329 Squamous cell carcinoma of skin of other parts of face: Secondary | ICD-10-CM | POA: Diagnosis not present

## 2023-01-09 DIAGNOSIS — I739 Peripheral vascular disease, unspecified: Secondary | ICD-10-CM | POA: Diagnosis not present

## 2023-01-09 DIAGNOSIS — L603 Nail dystrophy: Secondary | ICD-10-CM | POA: Diagnosis not present

## 2023-01-09 DIAGNOSIS — M79674 Pain in right toe(s): Secondary | ICD-10-CM | POA: Diagnosis not present

## 2023-01-09 DIAGNOSIS — B351 Tinea unguium: Secondary | ICD-10-CM | POA: Diagnosis not present

## 2023-01-09 DIAGNOSIS — M79675 Pain in left toe(s): Secondary | ICD-10-CM | POA: Diagnosis not present

## 2023-02-28 DIAGNOSIS — I10 Essential (primary) hypertension: Secondary | ICD-10-CM | POA: Diagnosis not present

## 2023-02-28 DIAGNOSIS — E7800 Pure hypercholesterolemia, unspecified: Secondary | ICD-10-CM | POA: Diagnosis not present

## 2023-03-01 DIAGNOSIS — C44329 Squamous cell carcinoma of skin of other parts of face: Secondary | ICD-10-CM | POA: Diagnosis not present

## 2023-03-01 DIAGNOSIS — C44311 Basal cell carcinoma of skin of nose: Secondary | ICD-10-CM | POA: Diagnosis not present

## 2023-03-03 DIAGNOSIS — R54 Age-related physical debility: Secondary | ICD-10-CM | POA: Diagnosis not present

## 2023-03-03 DIAGNOSIS — Z0000 Encounter for general adult medical examination without abnormal findings: Secondary | ICD-10-CM | POA: Diagnosis not present

## 2023-03-03 DIAGNOSIS — D638 Anemia in other chronic diseases classified elsewhere: Secondary | ICD-10-CM | POA: Diagnosis not present

## 2023-03-03 DIAGNOSIS — I5042 Chronic combined systolic (congestive) and diastolic (congestive) heart failure: Secondary | ICD-10-CM | POA: Diagnosis not present

## 2023-03-03 DIAGNOSIS — I10 Essential (primary) hypertension: Secondary | ICD-10-CM | POA: Diagnosis not present

## 2023-03-03 DIAGNOSIS — I4891 Unspecified atrial fibrillation: Secondary | ICD-10-CM | POA: Diagnosis not present

## 2023-03-03 DIAGNOSIS — C210 Malignant neoplasm of anus, unspecified: Secondary | ICD-10-CM | POA: Diagnosis not present

## 2023-03-03 DIAGNOSIS — E7800 Pure hypercholesterolemia, unspecified: Secondary | ICD-10-CM | POA: Diagnosis not present

## 2023-03-03 DIAGNOSIS — R270 Ataxia, unspecified: Secondary | ICD-10-CM | POA: Diagnosis not present

## 2023-03-10 DIAGNOSIS — C44520 Squamous cell carcinoma of anal skin: Secondary | ICD-10-CM | POA: Diagnosis not present

## 2023-03-10 DIAGNOSIS — E611 Iron deficiency: Secondary | ICD-10-CM | POA: Diagnosis not present

## 2023-03-10 DIAGNOSIS — C210 Malignant neoplasm of anus, unspecified: Secondary | ICD-10-CM | POA: Diagnosis not present

## 2023-03-13 DIAGNOSIS — Z85048 Personal history of other malignant neoplasm of rectum, rectosigmoid junction, and anus: Secondary | ICD-10-CM | POA: Diagnosis not present

## 2023-03-13 DIAGNOSIS — D72819 Decreased white blood cell count, unspecified: Secondary | ICD-10-CM | POA: Diagnosis not present

## 2023-03-13 DIAGNOSIS — Z1330 Encounter for screening examination for mental health and behavioral disorders, unspecified: Secondary | ICD-10-CM | POA: Diagnosis not present

## 2023-03-13 DIAGNOSIS — E611 Iron deficiency: Secondary | ICD-10-CM | POA: Diagnosis not present

## 2023-03-13 DIAGNOSIS — C210 Malignant neoplasm of anus, unspecified: Secondary | ICD-10-CM | POA: Diagnosis not present

## 2023-03-13 DIAGNOSIS — R768 Other specified abnormal immunological findings in serum: Secondary | ICD-10-CM | POA: Diagnosis not present

## 2023-03-13 DIAGNOSIS — I10 Essential (primary) hypertension: Secondary | ICD-10-CM | POA: Diagnosis not present

## 2023-03-16 DIAGNOSIS — C44629 Squamous cell carcinoma of skin of left upper limb, including shoulder: Secondary | ICD-10-CM | POA: Diagnosis not present

## 2023-03-24 DIAGNOSIS — R768 Other specified abnormal immunological findings in serum: Secondary | ICD-10-CM | POA: Diagnosis not present

## 2023-03-24 DIAGNOSIS — D472 Monoclonal gammopathy: Secondary | ICD-10-CM | POA: Diagnosis not present

## 2023-03-24 DIAGNOSIS — E611 Iron deficiency: Secondary | ICD-10-CM | POA: Diagnosis not present

## 2023-03-24 DIAGNOSIS — C210 Malignant neoplasm of anus, unspecified: Secondary | ICD-10-CM | POA: Diagnosis not present

## 2023-03-28 DIAGNOSIS — E538 Deficiency of other specified B group vitamins: Secondary | ICD-10-CM | POA: Diagnosis not present

## 2023-03-29 DIAGNOSIS — E538 Deficiency of other specified B group vitamins: Secondary | ICD-10-CM | POA: Diagnosis not present

## 2023-03-30 DIAGNOSIS — E538 Deficiency of other specified B group vitamins: Secondary | ICD-10-CM | POA: Diagnosis not present

## 2023-03-31 DIAGNOSIS — E538 Deficiency of other specified B group vitamins: Secondary | ICD-10-CM | POA: Diagnosis not present

## 2023-04-03 DIAGNOSIS — Z79899 Other long term (current) drug therapy: Secondary | ICD-10-CM | POA: Diagnosis not present

## 2023-04-03 DIAGNOSIS — E538 Deficiency of other specified B group vitamins: Secondary | ICD-10-CM | POA: Diagnosis not present

## 2023-04-11 DIAGNOSIS — E538 Deficiency of other specified B group vitamins: Secondary | ICD-10-CM | POA: Diagnosis not present

## 2023-04-21 DIAGNOSIS — D518 Other vitamin B12 deficiency anemias: Secondary | ICD-10-CM | POA: Diagnosis not present

## 2023-04-26 DIAGNOSIS — Z6822 Body mass index (BMI) 22.0-22.9, adult: Secondary | ICD-10-CM | POA: Diagnosis not present

## 2023-04-26 DIAGNOSIS — C210 Malignant neoplasm of anus, unspecified: Secondary | ICD-10-CM | POA: Diagnosis not present

## 2023-04-26 DIAGNOSIS — D518 Other vitamin B12 deficiency anemias: Secondary | ICD-10-CM | POA: Diagnosis not present

## 2023-05-05 DIAGNOSIS — C210 Malignant neoplasm of anus, unspecified: Secondary | ICD-10-CM | POA: Diagnosis not present

## 2023-05-05 DIAGNOSIS — D518 Other vitamin B12 deficiency anemias: Secondary | ICD-10-CM | POA: Diagnosis not present

## 2023-05-05 DIAGNOSIS — C44520 Squamous cell carcinoma of anal skin: Secondary | ICD-10-CM | POA: Diagnosis not present

## 2023-05-05 DIAGNOSIS — E860 Dehydration: Secondary | ICD-10-CM | POA: Diagnosis not present

## 2023-05-05 DIAGNOSIS — D509 Iron deficiency anemia, unspecified: Secondary | ICD-10-CM | POA: Diagnosis not present

## 2023-05-08 DIAGNOSIS — E210 Primary hyperparathyroidism: Secondary | ICD-10-CM | POA: Diagnosis not present

## 2023-05-10 DIAGNOSIS — C210 Malignant neoplasm of anus, unspecified: Secondary | ICD-10-CM | POA: Diagnosis not present

## 2023-06-06 DIAGNOSIS — E210 Primary hyperparathyroidism: Secondary | ICD-10-CM | POA: Diagnosis not present

## 2023-06-07 DIAGNOSIS — E210 Primary hyperparathyroidism: Secondary | ICD-10-CM | POA: Diagnosis not present

## 2023-06-08 DIAGNOSIS — E7800 Pure hypercholesterolemia, unspecified: Secondary | ICD-10-CM | POA: Diagnosis not present

## 2023-06-08 DIAGNOSIS — I4891 Unspecified atrial fibrillation: Secondary | ICD-10-CM | POA: Diagnosis not present

## 2023-06-08 DIAGNOSIS — C210 Malignant neoplasm of anus, unspecified: Secondary | ICD-10-CM | POA: Diagnosis not present

## 2023-06-08 DIAGNOSIS — I5042 Chronic combined systolic (congestive) and diastolic (congestive) heart failure: Secondary | ICD-10-CM | POA: Diagnosis not present

## 2023-06-08 DIAGNOSIS — I10 Essential (primary) hypertension: Secondary | ICD-10-CM | POA: Diagnosis not present

## 2023-06-12 DIAGNOSIS — C210 Malignant neoplasm of anus, unspecified: Secondary | ICD-10-CM | POA: Diagnosis not present

## 2023-06-12 DIAGNOSIS — M79675 Pain in left toe(s): Secondary | ICD-10-CM | POA: Diagnosis not present

## 2023-06-12 DIAGNOSIS — I10 Essential (primary) hypertension: Secondary | ICD-10-CM | POA: Diagnosis not present

## 2023-06-12 DIAGNOSIS — D472 Monoclonal gammopathy: Secondary | ICD-10-CM | POA: Diagnosis not present

## 2023-06-12 DIAGNOSIS — L603 Nail dystrophy: Secondary | ICD-10-CM | POA: Diagnosis not present

## 2023-06-12 DIAGNOSIS — I739 Peripheral vascular disease, unspecified: Secondary | ICD-10-CM | POA: Diagnosis not present

## 2023-06-12 DIAGNOSIS — E538 Deficiency of other specified B group vitamins: Secondary | ICD-10-CM | POA: Diagnosis not present

## 2023-06-12 DIAGNOSIS — M79674 Pain in right toe(s): Secondary | ICD-10-CM | POA: Diagnosis not present

## 2023-06-12 DIAGNOSIS — B351 Tinea unguium: Secondary | ICD-10-CM | POA: Diagnosis not present

## 2023-06-12 DIAGNOSIS — E611 Iron deficiency: Secondary | ICD-10-CM | POA: Diagnosis not present

## 2023-07-12 DIAGNOSIS — E210 Primary hyperparathyroidism: Secondary | ICD-10-CM | POA: Diagnosis not present

## 2023-07-17 DIAGNOSIS — E210 Primary hyperparathyroidism: Secondary | ICD-10-CM | POA: Diagnosis not present

## 2023-09-04 DIAGNOSIS — Z85048 Personal history of other malignant neoplasm of rectum, rectosigmoid junction, and anus: Secondary | ICD-10-CM | POA: Diagnosis not present

## 2023-09-04 DIAGNOSIS — I10 Essential (primary) hypertension: Secondary | ICD-10-CM | POA: Diagnosis not present

## 2023-09-04 DIAGNOSIS — Z08 Encounter for follow-up examination after completed treatment for malignant neoplasm: Secondary | ICD-10-CM | POA: Diagnosis not present

## 2023-09-06 DIAGNOSIS — N390 Urinary tract infection, site not specified: Secondary | ICD-10-CM | POA: Diagnosis not present

## 2023-09-06 DIAGNOSIS — I10 Essential (primary) hypertension: Secondary | ICD-10-CM | POA: Diagnosis not present

## 2023-09-06 DIAGNOSIS — E7800 Pure hypercholesterolemia, unspecified: Secondary | ICD-10-CM | POA: Diagnosis not present

## 2023-09-07 DIAGNOSIS — C210 Malignant neoplasm of anus, unspecified: Secondary | ICD-10-CM | POA: Diagnosis not present

## 2023-09-07 DIAGNOSIS — N3001 Acute cystitis with hematuria: Secondary | ICD-10-CM | POA: Diagnosis not present

## 2023-09-07 DIAGNOSIS — D472 Monoclonal gammopathy: Secondary | ICD-10-CM | POA: Diagnosis not present

## 2023-09-07 DIAGNOSIS — C44520 Squamous cell carcinoma of anal skin: Secondary | ICD-10-CM | POA: Diagnosis not present

## 2023-09-07 DIAGNOSIS — E538 Deficiency of other specified B group vitamins: Secondary | ICD-10-CM | POA: Diagnosis not present

## 2023-09-07 DIAGNOSIS — I10 Essential (primary) hypertension: Secondary | ICD-10-CM | POA: Diagnosis not present

## 2023-09-07 DIAGNOSIS — R768 Other specified abnormal immunological findings in serum: Secondary | ICD-10-CM | POA: Diagnosis not present

## 2023-09-07 DIAGNOSIS — D509 Iron deficiency anemia, unspecified: Secondary | ICD-10-CM | POA: Diagnosis not present

## 2023-09-07 DIAGNOSIS — E611 Iron deficiency: Secondary | ICD-10-CM | POA: Diagnosis not present

## 2023-09-07 DIAGNOSIS — L989 Disorder of the skin and subcutaneous tissue, unspecified: Secondary | ICD-10-CM | POA: Diagnosis not present

## 2023-09-08 DIAGNOSIS — I5042 Chronic combined systolic (congestive) and diastolic (congestive) heart failure: Secondary | ICD-10-CM | POA: Diagnosis not present

## 2023-09-08 DIAGNOSIS — E7800 Pure hypercholesterolemia, unspecified: Secondary | ICD-10-CM | POA: Diagnosis not present

## 2023-09-08 DIAGNOSIS — C210 Malignant neoplasm of anus, unspecified: Secondary | ICD-10-CM | POA: Diagnosis not present

## 2023-09-08 DIAGNOSIS — I4891 Unspecified atrial fibrillation: Secondary | ICD-10-CM | POA: Diagnosis not present

## 2023-09-08 DIAGNOSIS — I10 Essential (primary) hypertension: Secondary | ICD-10-CM | POA: Diagnosis not present

## 2023-10-27 DIAGNOSIS — C210 Malignant neoplasm of anus, unspecified: Secondary | ICD-10-CM | POA: Diagnosis not present

## 2023-10-27 DIAGNOSIS — R54 Age-related physical debility: Secondary | ICD-10-CM | POA: Diagnosis not present

## 2023-10-27 DIAGNOSIS — I10 Essential (primary) hypertension: Secondary | ICD-10-CM | POA: Diagnosis not present

## 2023-10-27 DIAGNOSIS — Z711 Person with feared health complaint in whom no diagnosis is made: Secondary | ICD-10-CM | POA: Diagnosis not present

## 2023-10-27 DIAGNOSIS — I4891 Unspecified atrial fibrillation: Secondary | ICD-10-CM | POA: Diagnosis not present

## 2023-10-27 DIAGNOSIS — I5022 Chronic systolic (congestive) heart failure: Secondary | ICD-10-CM | POA: Diagnosis not present

## 2023-10-27 DIAGNOSIS — E7800 Pure hypercholesterolemia, unspecified: Secondary | ICD-10-CM | POA: Diagnosis not present

## 2023-10-27 DIAGNOSIS — I5042 Chronic combined systolic (congestive) and diastolic (congestive) heart failure: Secondary | ICD-10-CM | POA: Diagnosis not present

## 2023-11-09 DIAGNOSIS — B351 Tinea unguium: Secondary | ICD-10-CM | POA: Diagnosis not present

## 2023-11-09 DIAGNOSIS — M79675 Pain in left toe(s): Secondary | ICD-10-CM | POA: Diagnosis not present

## 2023-11-09 DIAGNOSIS — L603 Nail dystrophy: Secondary | ICD-10-CM | POA: Diagnosis not present

## 2023-11-09 DIAGNOSIS — I739 Peripheral vascular disease, unspecified: Secondary | ICD-10-CM | POA: Diagnosis not present

## 2023-11-09 DIAGNOSIS — M79674 Pain in right toe(s): Secondary | ICD-10-CM | POA: Diagnosis not present
